# Patient Record
Sex: Female | Born: 2000 | Race: White | Hispanic: No | Marital: Single | Smoking: Never smoker
Health system: Southern US, Community
[De-identification: ages and names within clinical notes are randomized; demographics above are authoritative.]

## PROBLEM LIST (undated history)

## (undated) ENCOUNTER — Inpatient Hospital Stay (HOSPITAL_COMMUNITY): Payer: Self-pay

## (undated) DIAGNOSIS — F419 Anxiety disorder, unspecified: Secondary | ICD-10-CM

## (undated) DIAGNOSIS — F32A Depression, unspecified: Secondary | ICD-10-CM

## (undated) DIAGNOSIS — F321 Major depressive disorder, single episode, moderate: Secondary | ICD-10-CM

## (undated) HISTORY — DX: Major depressive disorder, single episode, moderate: F32.1

## (undated) HISTORY — DX: Anxiety disorder, unspecified: F41.9

## (undated) HISTORY — PX: NO PAST SURGERIES: SHX2092

## (undated) HISTORY — DX: Depression, unspecified: F32.A

---
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
## 2021-02-02 ENCOUNTER — Emergency Department
Admission: EM | Admit: 2021-02-02 | Discharge: 2021-02-02 | Disposition: A | Discharge status: Home / Self Care | Payer: Medicaid Other | Attending: Emergency Medicine | Admitting: Emergency Medicine

## 2021-02-02 ENCOUNTER — Other Ambulatory Visit: Payer: Self-pay

## 2021-02-02 DIAGNOSIS — Y9289 Other specified places as the place of occurrence of the external cause: Secondary | ICD-10-CM | POA: Insufficient documentation

## 2021-02-02 DIAGNOSIS — S71151A Open bite, right thigh, initial encounter: Secondary | ICD-10-CM | POA: Insufficient documentation

## 2021-02-02 DIAGNOSIS — Y9301 Activity, walking, marching and hiking: Secondary | ICD-10-CM | POA: Diagnosis not present

## 2021-02-02 DIAGNOSIS — Z23 Encounter for immunization: Secondary | ICD-10-CM | POA: Insufficient documentation

## 2021-02-02 DIAGNOSIS — Z7722 Contact with and (suspected) exposure to environmental tobacco smoke (acute) (chronic): Secondary | ICD-10-CM | POA: Diagnosis not present

## 2021-02-02 DIAGNOSIS — W540XXA Bitten by dog, initial encounter: Secondary | ICD-10-CM | POA: Insufficient documentation

## 2021-02-02 MED ORDER — TETANUS-DIPHTH-ACELL PERTUSSIS 5-2.5-18.5 LF-MCG/0.5 IM SUSY
0.5000 mL | PREFILLED_SYRINGE | Freq: Once | INTRAMUSCULAR | Status: AC
  Administered 2021-02-02: 0.5 mL via INTRAMUSCULAR
  Filled 2021-02-02: qty 0.5

## 2021-02-02 MED ORDER — AMOXICILLIN-POT CLAVULANATE 875-125 MG PO TABS: 1.0000 | ORAL_TABLET | Freq: Two times a day (BID) | ORAL | 0 refills | Status: AC

## 2021-02-02 NOTE — Discharge Instructions (Addendum)
Take Augmentin twice daily for the next 10 days. Please report incident to [facility] police to make sure animal is available to be quarantined for signs and symptoms of rabies. If animal was not available for quarantine, please return to the emergency department for rabies vaccine series.

## 2021-02-02 NOTE — ED Triage Notes (Signed)
Pt was bitten by neighbors dog to posterior right upper leg, small puncture wounds noted. Pt was not up to date on vaccinations but animal is quarantining the dog.

## 2021-02-02 NOTE — ED Provider Notes (Signed)
[facility]  ____________________________________________  Time seen: Approximately 9:40 PM  I have reviewed the triage vital signs and the nursing notes.   HISTORY  Chief Complaint Animal Bite   Historian Patient     HPI Julie Rios is a 20 y.o. female presents to the emergency department with superficial puncture wounds along the lateral aspect of the right upper thigh.  Patient was taking a walk outside when her neighbors dog bit her.  Dog is available to be quarantined.  Patient is unsure of rabies vaccine status for dog.  Patient's last tetanus shot was 7 years ago.  No similar injuries in the past.   No past medical history on file.   Immunizations up to date:  Yes.     No past medical history on file.  There are no problems to display for this patient.   No past surgical history on file.  Prior to Admission medications   Medication Sig Start Date End Date Taking? Authorizing Provider  amoxicillin-clavulanate (AUGMENTIN) 875-125 MG tablet Take 1 tablet by mouth 2 (two) times daily for 10 days. 02/02/21 02/12/21 Yes Pia Mau M, PA-C  ondansetron (ZOFRAN) 4 MG tablet Take 1-2 tablets (4-8 mg total) by mouth every 6 (six) hours. 08/21/19   Eustace Moore, MD  traMADol-acetaminophen (ULTRACET) 37.5-325 MG tablet Take 1-2 tablets by mouth every 6 (six) hours as needed (headache). 08/21/19   Eustace Moore, MD    Allergies Patient has no known allergies.  Family History  Problem Relation Age of Onset   Diabetes Mother    Cirrhosis Mother     Social History Social History   Tobacco Use   Smoking status: Passive Smoke Exposure - Never Smoker   Smokeless tobacco: Never  Substance Use Topics   Alcohol use: No   Drug use: No     Review of Systems  Constitutional: No fever/chills Eyes:  No discharge ENT: No upper respiratory complaints. Respiratory: no cough. No SOB/ use of accessory muscles to breath Gastrointestinal:   No  nausea, no vomiting.  No diarrhea.  No constipation. Musculoskeletal: Negative for musculoskeletal pain. Skin: Patient has dog bite wound.     ____________________________________________   PHYSICAL EXAM:  VITAL SIGNS: ED Triage Vitals  Enc Vitals Group     BP 02/02/21 1923 124/85     Pulse Rate 02/02/21 1923 94     Resp 02/02/21 1923 20     Temp 02/02/21 1923 98.3 F (36.8 C)     Temp Source 02/02/21 1923 Oral     SpO2 02/02/21 1923 100 %     Weight 02/02/21 1924 180 lb (81.6 kg)     Height 02/02/21 1924 5\' 5"  (1.651 m)     Head Circumference --      Peak Flow --      Pain Score 02/02/21 1924 4     Pain Loc --      Pain Edu? --      Excl. in GC? --      Constitutional: Alert and oriented. Well appearing and in no acute distress. Eyes: Conjunctivae are normal. PERRL. EOMI. Head: Atraumatic. ENT:      Nose: No congestion/rhinnorhea.      Mouth/Throat: Mucous membranes are moist.  Neck: No stridor.  No cervical spine tenderness to palpation. Cardiovascular: Normal rate, regular rhythm. Normal S1 and S2.  Good peripheral circulation. Respiratory: Normal respiratory effort without tachypnea or retractions. Lungs CTAB. Good air entry to the bases with no decreased or  absent breath sounds Gastrointestinal: Bowel sounds x 4 quadrants. Soft and nontender to palpation. No guarding or rigidity. No distention. Musculoskeletal: Full range of motion to all extremities. No obvious deformities noted Neurologic:  Normal for age. No gross focal neurologic deficits are appreciated.  Skin: Patient has superficial puncture wounds along the lateral aspect of the right upper leg. Psychiatric: Mood and affect are normal for age. Speech and behavior are normal.   ____________________________________________   LABS (all labs ordered are listed, but only abnormal results are displayed)  Labs Reviewed - No data to  display ____________________________________________  EKG   ____________________________________________  RADIOLOGY   No results found.  ____________________________________________    PROCEDURES  Procedure(s) performed:     Procedures     Medications  Tdap (BOOSTRIX) injection 0.5 mL (has no administration in time range)     ____________________________________________   INITIAL IMPRESSION / ASSESSMENT AND PLAN / ED COURSE  Pertinent labs & imaging results that were available during my care of the patient were reviewed by me and considered in my medical decision making (see chart for details).      Assessment and plan Dog bite 20 year old female presents to the emergency department after she was bitten by her neighbors dog and sustained superficial puncture wounds along the lateral aspect of the right upper thigh.  Patient's wounds were irrigated in the emergency department and patient was started on Augmentin.  Patient declined rabies vaccine series in the emergency department stating animal could be detained by animal control.  Patient's tetanus status was updated in emergency department.  I emphasized the importance of compliance to Augmentin as there is a high risk of infection without antibiotics following a dog bite wound.  Patient voiced understanding and has easy access to the emergency department should her symptoms change or worsen.     ____________________________________________  FINAL CLINICAL IMPRESSION(S) / ED DIAGNOSES  Final diagnoses:  Dog bite, initial encounter      NEW MEDICATIONS STARTED DURING THIS VISIT:  ED Discharge Orders          Ordered    amoxicillin-clavulanate (AUGMENTIN) 875-125 MG tablet  2 times daily        02/02/21 2127                This chart was dictated using voice recognition software/Dragon. Despite best efforts to proofread, errors can occur which can change the meaning. Any change was purely  unintentional.     Gasper Lloyd 02/02/21 2145    Phineas Semen, MD 02/02/21 [phone]

## 2021-02-05 ENCOUNTER — Encounter: Payer: Self-pay | Admitting: Emergency Medicine

## 2021-02-05 ENCOUNTER — Emergency Department: Payer: Medicaid Other

## 2021-02-05 ENCOUNTER — Other Ambulatory Visit: Payer: Self-pay

## 2021-02-05 ENCOUNTER — Emergency Department
Admission: EM | Admit: 2021-02-05 | Discharge: 2021-02-05 | Disposition: A | Discharge status: Home / Self Care | Payer: Medicaid Other | Attending: Emergency Medicine | Admitting: Emergency Medicine

## 2021-02-05 DIAGNOSIS — Z7722 Contact with and (suspected) exposure to environmental tobacco smoke (acute) (chronic): Secondary | ICD-10-CM | POA: Insufficient documentation

## 2021-02-05 DIAGNOSIS — Z203 Contact with and (suspected) exposure to rabies: Secondary | ICD-10-CM | POA: Insufficient documentation

## 2021-02-05 DIAGNOSIS — Z23 Encounter for immunization: Secondary | ICD-10-CM | POA: Diagnosis not present

## 2021-02-05 DIAGNOSIS — M79604 Pain in right leg: Secondary | ICD-10-CM | POA: Diagnosis not present

## 2021-02-05 DIAGNOSIS — Z2914 Encounter for prophylactic rabies immune globin: Secondary | ICD-10-CM | POA: Diagnosis present

## 2021-02-05 DIAGNOSIS — W540XXD Bitten by dog, subsequent encounter: Secondary | ICD-10-CM

## 2021-02-05 DIAGNOSIS — W57XXXD Bitten or stung by nonvenomous insect and other nonvenomous arthropods, subsequent encounter: Secondary | ICD-10-CM | POA: Insufficient documentation

## 2021-02-05 IMAGING — CR DG FEMUR 2+V*R*
4 series · 4 of 4 positions shown · non-contrast
Comparison: None.

CLINICAL DATA: Dog bite, concern for foreign body

EXAM:
RIGHT FEMUR 2 VIEWS

[femur ap (1 of 2)]
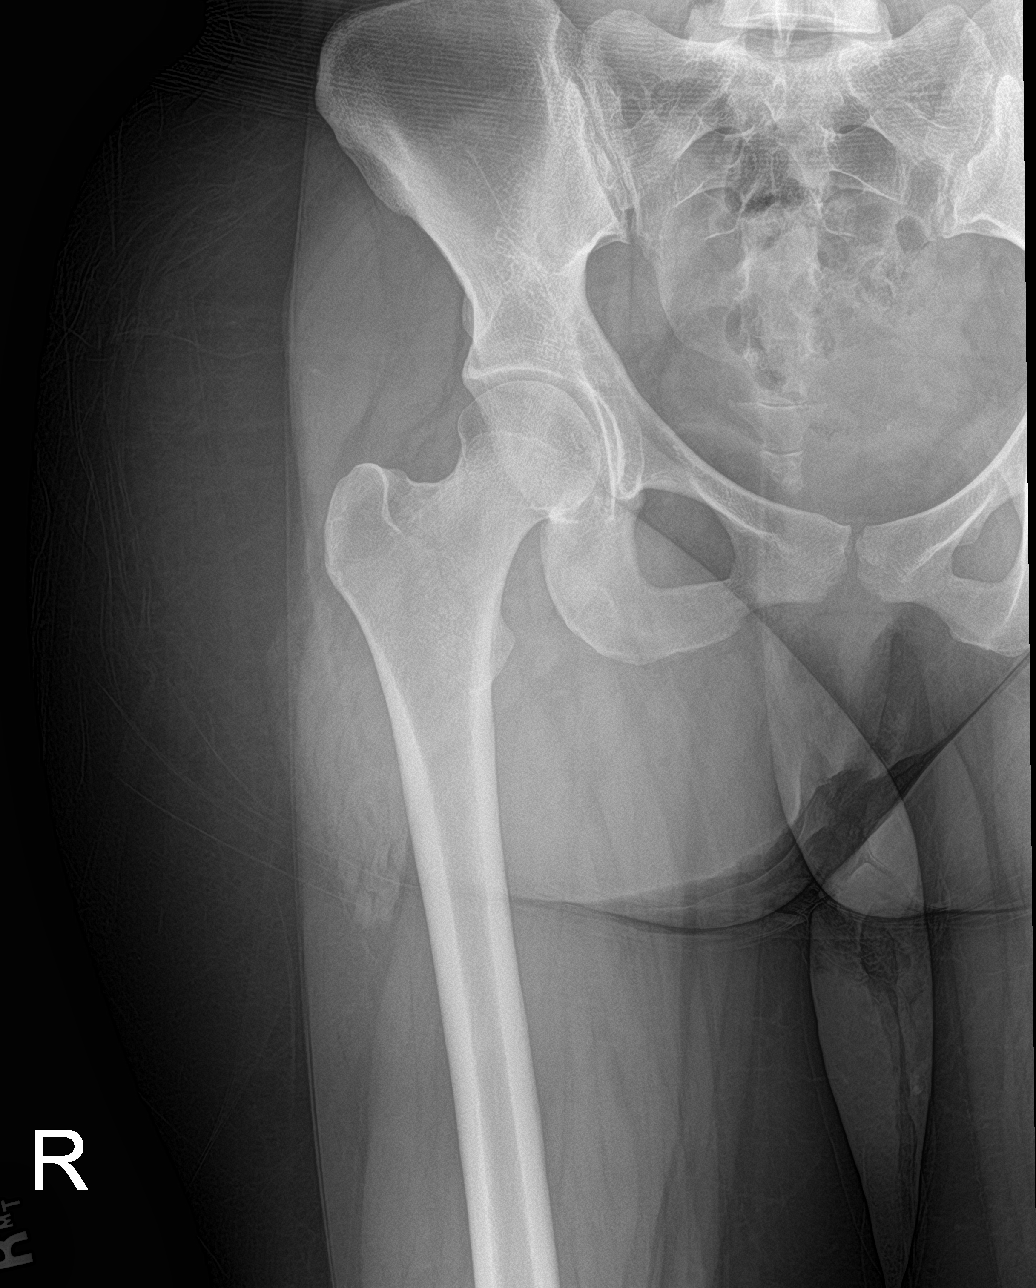

[femur ap (2 of 2)]
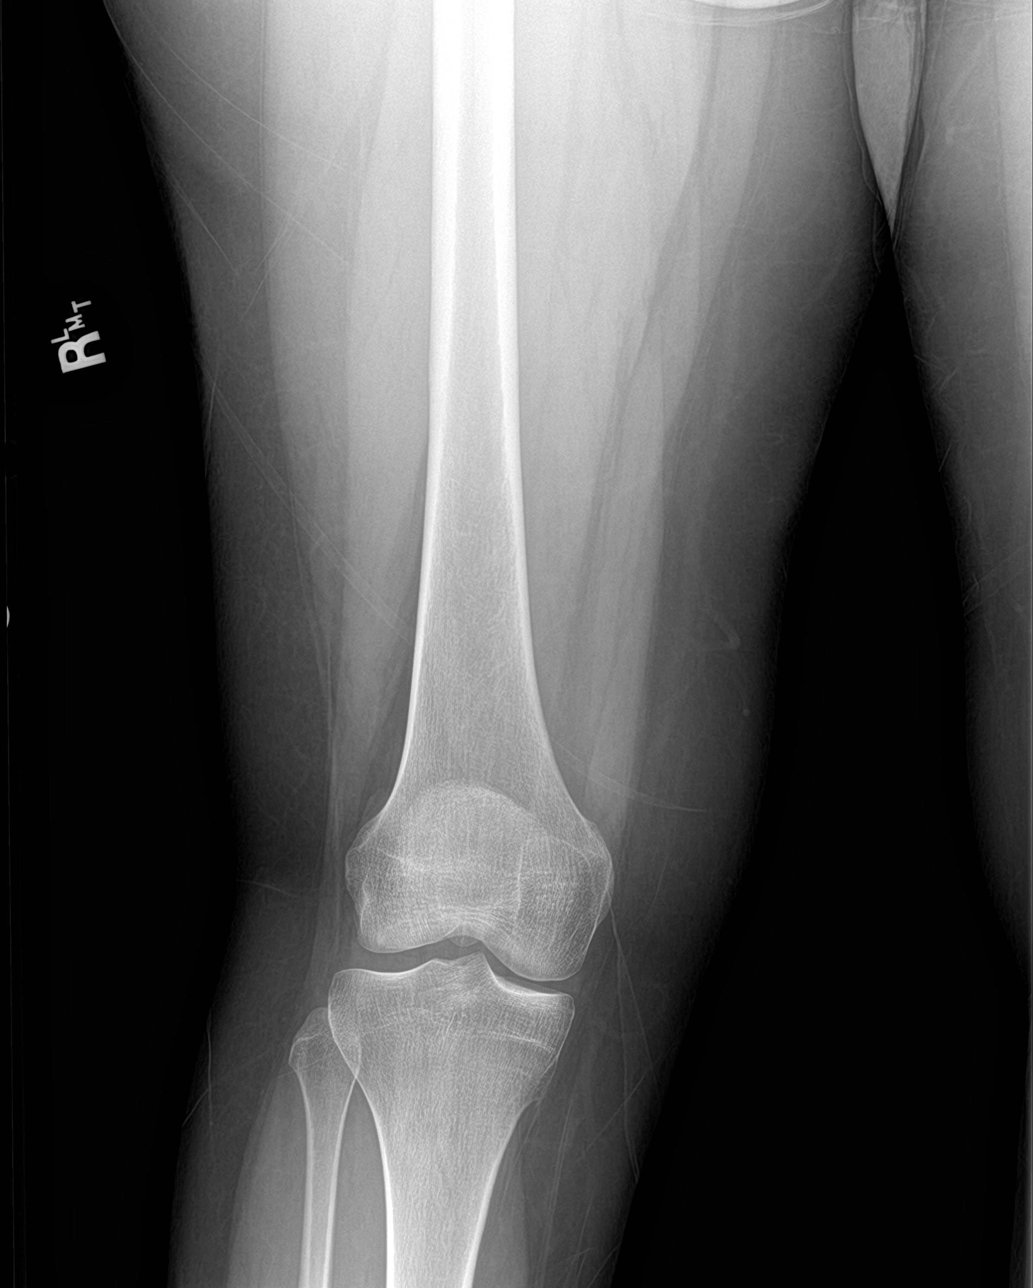

[femur lat (1 of 2)]
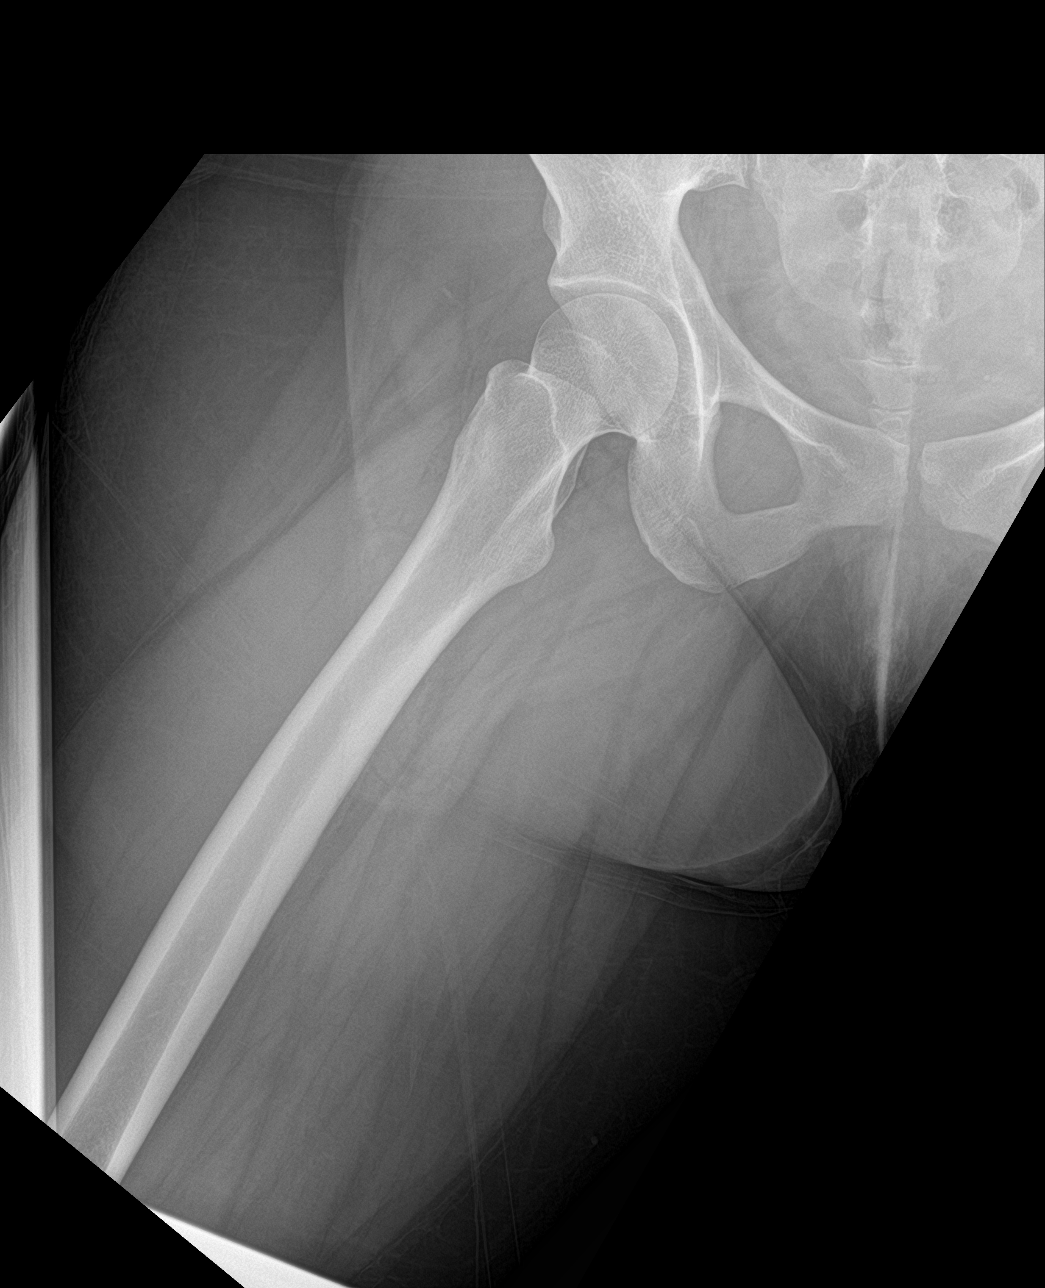

[femur lat (2 of 2)]
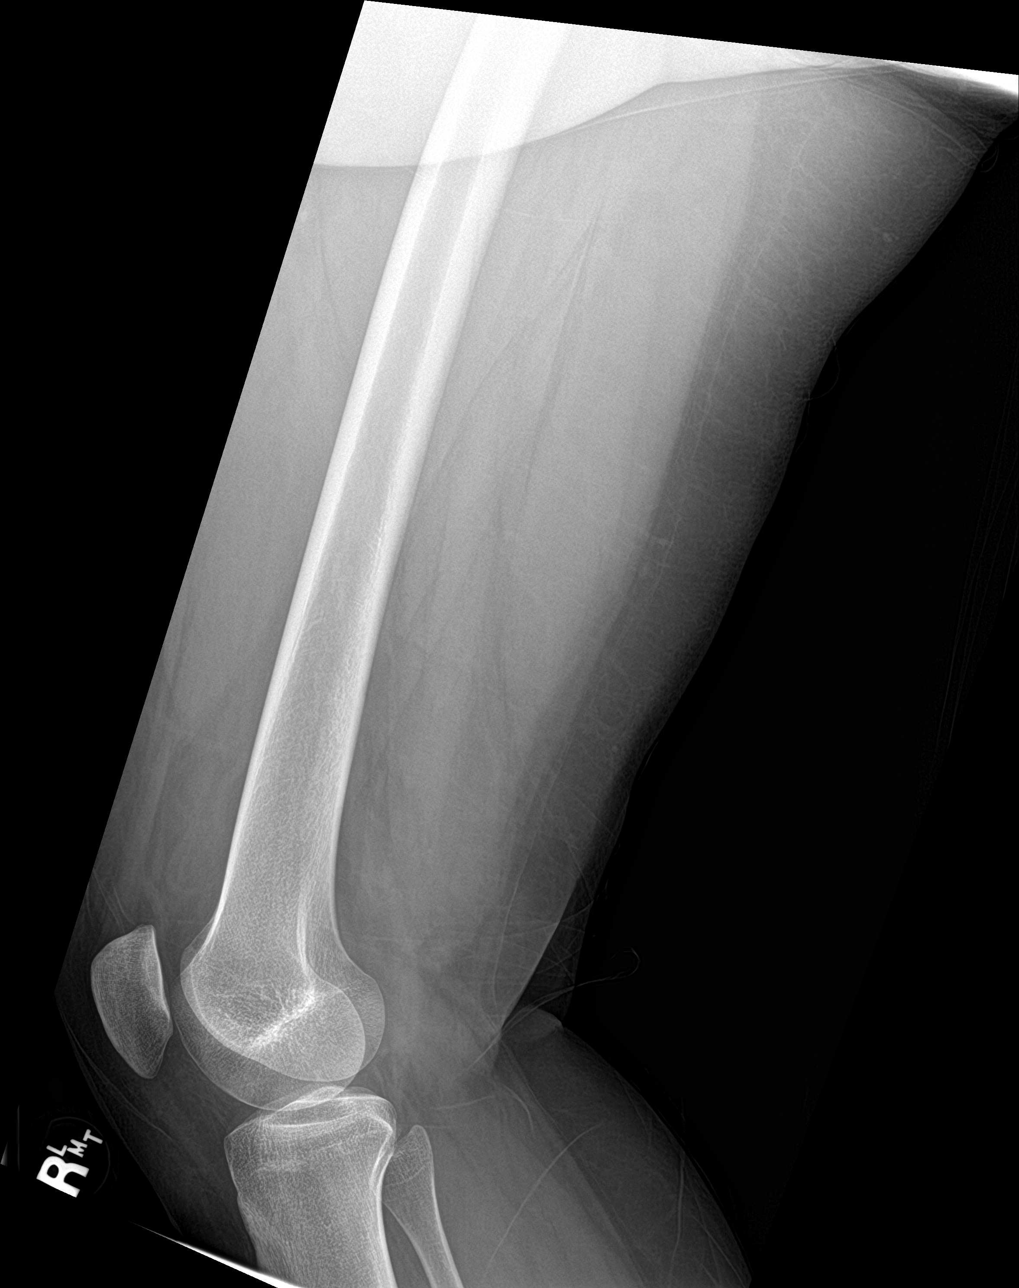

[4 of 4 positions shown; findings below may reference images not displayed]

FINDINGS: No radiopaque foreign body is identified. The bones are normal in
appearance. Knee and hip alignment is normal. There is no soft
tissue gas.
IMPRESSION: No radiopaque foreign body identified.

## 2021-02-05 MED ORDER — RABIES VACCINE, PCEC IM SUSR
1.0000 mL | Freq: Once | INTRAMUSCULAR | Status: AC
  Administered 2021-02-05: 1 mL via INTRAMUSCULAR
  Filled 2021-02-05: qty 1

## 2021-02-05 MED ORDER — RABIES IMMUNE GLOBULIN 150 UNIT/ML IM INJ
20.0000 [IU]/kg | INJECTION | Freq: Once | INTRAMUSCULAR | Status: AC
  Administered 2021-02-05: 1650 [IU] via INTRAMUSCULAR
  Filled 2021-02-05: qty 11

## 2021-02-05 NOTE — ED Provider Notes (Signed)
[facility] Emergency Department Provider Note  ____________________________________________   Event Date/Time   First MD Initiated Contact with Patient 02/05/21 [phone]     (approximate)  I have reviewed the triage vital signs and the nursing notes.   HISTORY  Chief Complaint Rabies Injection    HPI Julie Rios is a 20 y.o. female presents emergency department for a rabies injection.  Patient was bitten by a dog and seen here in the emergency department.  Animal control told her that the dog is unvaccinated and that she should come here for vaccines.  Patient did get her Tdap updated plan here in the ED.  Complains of continued pain and soreness on the right leg.  States it feels worse than it did the other day  History reviewed. No pertinent past medical history.  There are no problems to display for this patient.   History reviewed. No pertinent surgical history.  Prior to Admission medications   Medication Sig Start Date End Date Taking? Authorizing Provider  amoxicillin-clavulanate (AUGMENTIN) 875-125 MG tablet Take 1 tablet by mouth 2 (two) times daily for 10 days. 02/02/21 02/12/21  Orvil Feil, PA-C  ondansetron (ZOFRAN) 4 MG tablet Take 1-2 tablets (4-8 mg total) by mouth every 6 (six) hours. 08/21/19   Eustace Moore, MD  traMADol-acetaminophen (ULTRACET) 37.5-325 MG tablet Take 1-2 tablets by mouth every 6 (six) hours as needed (headache). 08/21/19   Eustace Moore, MD    Allergies Patient has no known allergies.  Family History  Problem Relation Age of Onset   Diabetes Mother    Cirrhosis Mother     Social History Social History   Tobacco Use   Smoking status: Passive Smoke Exposure - Never Smoker   Smokeless tobacco: Never  Substance Use Topics   Alcohol use: No   Drug use: No    Review of Systems  Constitutional: No fever/chills Eyes: No visual changes. ENT: No sore throat. Respiratory: Denies  cough Cardiovascular: Denies chest pain Gastrointestinal: Denies abdominal pain Genitourinary: Negative for dysuria. Musculoskeletal: Negative for back pain.  Positive for right leg pain from dog bite Skin: Negative for rash. Psychiatric: no mood changes,     ____________________________________________   PHYSICAL EXAM:  VITAL SIGNS: ED Triage Vitals  Enc Vitals Group     BP 02/05/21 0953 122/74     Pulse Rate 02/05/21 0953 90     Resp 02/05/21 0953 17     Temp 02/05/21 0953 97.9 F (36.6 C)     Temp Source 02/05/21 0953 Oral     SpO2 02/05/21 0953 99 %     Weight 02/05/21 0937 179 lb 14.3 oz (81.6 kg)     Height 02/05/21 0937 5\' 5"  (1.651 m)     Head Circumference --      Peak Flow --      Pain Score 02/05/21 0937 0     Pain Loc --      Pain Edu? --      Excl. in GC? --     Constitutional: Alert and oriented. Well appearing and in no acute distress. Eyes: Conjunctivae are normal.  Head: Atraumatic. Nose: No congestion/rhinnorhea. Mouth/Throat: Mucous membranes are moist.   Neck:  supple no lymphadenopathy noted Cardiovascular: Normal rate, regular rhythm.  Respiratory: Normal respiratory effort.  No retractions,  GU: deferred Musculoskeletal: FROM all extremities, warm and well perfused, right leg is tender to palpation along the site of the dog bite, large amount of bruising,  puncture wounds noted, no redness Neurologic:  Normal speech and language.  Skin:  Skin is warm, dry and intact. No rash noted. Psychiatric: Mood and affect are normal. Speech and behavior are normal.  ____________________________________________   LABS (all labs ordered are listed, but only abnormal results are displayed)  Labs Reviewed - No data to display ____________________________________________   ____________________________________________  RADIOLOGY  Right femur  ____________________________________________   PROCEDURES  Procedure(s) performed:  No  Procedures    ____________________________________________   INITIAL IMPRESSION / ASSESSMENT AND PLAN / ED COURSE  Pertinent labs & imaging results that were available during my care of the patient were reviewed by me and considered in my medical decision making (see chart for details).   The patient is 20 year old female presents with dog bite.  Already evaluated in the ED previously.  Was told by animal control to come get rabies vaccinations as the dog was not acting correctly and is not vaccinated.  X-ray of the right femur to rule out foreign body or gas pattern Rabies vaccines ordered  X-ray of the right femur reviewed by me confirmed by radiology to be negative for foreign body or gas pattern  Rabies vaccine and schedule given to the patient.  She is to follow-up at the urgent care for repeat RabAvert's.  Return emergency department if worsening.  Discharged in stable condition  Julie Rios was evaluated in Emergency Department on 02/05/2021 for the symptoms described in the history of present illness. She was evaluated in the context of the global COVID-19 pandemic, which necessitated consideration that the patient might be at risk for infection with the SARS-CoV-2 virus that causes COVID-19. Institutional protocols and algorithms that pertain to the evaluation of patients at risk for COVID-19 are in a state of rapid change based on information released by regulatory bodies including the CDC and federal and state organizations. These policies and algorithms were followed during the patient's care in the ED.    As part of my medical decision making, I reviewed the following data within the electronic MEDICAL RECORD NUMBER Nursing notes reviewed and incorporated, Old chart reviewed, Radiograph reviewed , Notes from prior ED visits, and [place] Controlled Substance Database  ____________________________________________   FINAL CLINICAL IMPRESSION(S) / ED DIAGNOSES  Final diagnoses:   Dog bite, subsequent encounter  Encounter for prophylactic rabies immune globin      NEW MEDICATIONS STARTED DURING THIS VISIT:  New Prescriptions   No medications on file     Note:  This document was prepared using Dragon voice recognition software and may include unintentional dictation errors.    Faythe Ghee, PA-C 02/05/21 1149    Sharman Cheek, MD 02/05/21 1452

## 2021-02-05 NOTE — Discharge Instructions (Addendum)
Return in 3 days, 7 days and 14 days for repeat administraion of rabavert vaccine, you may go to urgent care to get this vaccine.

## 2021-02-05 NOTE — ED Triage Notes (Signed)
Arrives for initial rabies injection.  Bitten by dog and seen through ED for bite.  Heard from [employer] that dog was not up to date with vaccines.  AAOx3.  Skin warm and dry. NAD

## 2021-02-08 ENCOUNTER — Ambulatory Visit (HOSPITAL_COMMUNITY)
Admission: EM | Admit: 2021-02-08 | Discharge: 2021-02-08 | Disposition: A | Discharge status: Home / Self Care | Payer: Medicaid Other | Attending: Internal Medicine | Admitting: Internal Medicine

## 2021-02-08 ENCOUNTER — Other Ambulatory Visit: Payer: Self-pay

## 2021-02-08 DIAGNOSIS — Z203 Contact with and (suspected) exposure to rabies: Secondary | ICD-10-CM

## 2021-02-08 MED ORDER — RABIES VACCINE, PCEC IM SUSR
1.0000 mL | Freq: Once | INTRAMUSCULAR | Status: AC
  Administered 2021-02-08: 1 mL via INTRAMUSCULAR

## 2021-02-08 MED ORDER — RABIES VACCINE, PCEC IM SUSR
INTRAMUSCULAR | Status: AC
  Filled 2021-02-08: qty 1

## 2021-02-08 NOTE — ED Triage Notes (Signed)
Pt presents for rabies vaccine. Denies any concerns at this time.  °

## 2021-02-12 ENCOUNTER — Inpatient Hospital Stay
Admission: RE | Admit: 2021-02-12 | Discharge: 2021-02-12 | Disposition: A | Discharge status: Home / Self Care | Payer: Self-pay | Source: Ambulatory Visit

## 2021-02-12 ENCOUNTER — Other Ambulatory Visit: Payer: Self-pay

## 2021-02-12 ENCOUNTER — Ambulatory Visit
Admission: EM | Admit: 2021-02-12 | Discharge: 2021-02-12 | Disposition: A | Discharge status: Home / Self Care | Payer: Medicaid Other | Attending: Internal Medicine | Admitting: Internal Medicine

## 2021-02-12 DIAGNOSIS — Z23 Encounter for immunization: Secondary | ICD-10-CM

## 2021-02-12 DIAGNOSIS — Z203 Contact with and (suspected) exposure to rabies: Secondary | ICD-10-CM | POA: Diagnosis not present

## 2021-02-12 MED ORDER — RABIES VACCINE, PCEC IM SUSR
1.0000 mL | Freq: Once | INTRAMUSCULAR | Status: AC
  Administered 2021-02-12: 1 mL via INTRAMUSCULAR

## 2021-02-12 NOTE — Discharge Instructions (Signed)
Return 02/19/2021

## 2021-02-12 NOTE — ED Triage Notes (Signed)
Patient is here for rabies injection

## 2021-02-12 NOTE — Discharge Instructions (Signed)
Return October 10 for final rabies injection

## 2021-02-12 NOTE — ED Notes (Signed)
Family member dropped patient off at [place] location.  Patient was not certain about details of ED visit on 9/26.  Patient had not received any rabies vaccines on that visit.  Patient started rabies series on 9/29.  Family member had details of visits to ED and ucc that clarified onset and sequence of vaccines.

## 2021-02-19 ENCOUNTER — Ambulatory Visit
Admission: RE | Admit: 2021-02-19 | Discharge: 2021-02-19 | Disposition: A | Discharge status: Home / Self Care | Payer: Medicaid Other | Source: Ambulatory Visit | Attending: Emergency Medicine | Admitting: Emergency Medicine

## 2021-02-19 ENCOUNTER — Other Ambulatory Visit: Payer: Self-pay

## 2021-02-19 DIAGNOSIS — Z203 Contact with and (suspected) exposure to rabies: Secondary | ICD-10-CM

## 2021-02-19 MED ORDER — RABIES VACCINE, PCEC IM SUSR
1.0000 mL | Freq: Once | INTRAMUSCULAR | Status: AC
  Administered 2021-02-19: 1 mL via INTRAMUSCULAR

## 2021-02-19 NOTE — ED Triage Notes (Signed)
Patient presents to urgent care for Rabies vaccine. Initial dose given 09/29. Today marks day 14 for last dose. Pt tolerated previous doses well.

## 2021-02-19 NOTE — ED Notes (Signed)
Pt received last dose of rabies vaccine. Tolerated well. Edu provided. Voiced understanding.

## 2021-04-16 ENCOUNTER — Other Ambulatory Visit: Payer: Self-pay

## 2021-04-16 ENCOUNTER — Other Ambulatory Visit (HOSPITAL_COMMUNITY)
Admission: EM | Admit: 2021-04-16 | Discharge: 2021-04-20 | Disposition: A | Discharge status: Home / Self Care | Payer: Medicaid Other | Attending: Psychiatry | Admitting: Psychiatry

## 2021-04-16 DIAGNOSIS — Z634 Disappearance and death of family member: Secondary | ICD-10-CM | POA: Insufficient documentation

## 2021-04-16 DIAGNOSIS — F419 Anxiety disorder, unspecified: Secondary | ICD-10-CM | POA: Diagnosis not present

## 2021-04-16 DIAGNOSIS — R519 Headache, unspecified: Secondary | ICD-10-CM | POA: Insufficient documentation

## 2021-04-16 DIAGNOSIS — F321 Major depressive disorder, single episode, moderate: Secondary | ICD-10-CM | POA: Insufficient documentation

## 2021-04-16 DIAGNOSIS — Z20822 Contact with and (suspected) exposure to covid-19: Secondary | ICD-10-CM | POA: Diagnosis not present

## 2021-04-16 DIAGNOSIS — F322 Major depressive disorder, single episode, severe without psychotic features: Secondary | ICD-10-CM | POA: Insufficient documentation

## 2021-04-16 DIAGNOSIS — R480 Dyslexia and alexia: Secondary | ICD-10-CM | POA: Insufficient documentation

## 2021-04-16 DIAGNOSIS — F332 Major depressive disorder, recurrent severe without psychotic features: Secondary | ICD-10-CM | POA: Diagnosis present

## 2021-04-16 DIAGNOSIS — F339 Major depressive disorder, recurrent, unspecified: Secondary | ICD-10-CM | POA: Diagnosis present

## 2021-04-16 DIAGNOSIS — R45851 Suicidal ideations: Secondary | ICD-10-CM | POA: Insufficient documentation

## 2021-04-16 DIAGNOSIS — Z566 Other physical and mental strain related to work: Secondary | ICD-10-CM | POA: Insufficient documentation

## 2021-04-16 DIAGNOSIS — Z79899 Other long term (current) drug therapy: Secondary | ICD-10-CM | POA: Insufficient documentation

## 2021-04-16 LAB — POCT URINE DRUG SCREEN - MANUAL ENTRY (I-SCREEN)
POC Amphetamine UR: NOT DETECTED
POC Buprenorphine (BUP): NOT DETECTED
POC Cocaine UR: NOT DETECTED
POC Marijuana UR: NOT DETECTED
POC Methadone UR: NOT DETECTED
POC Methamphetamine UR: NOT DETECTED
POC Morphine: NOT DETECTED
POC Oxazepam (BZO): NOT DETECTED
POC Oxycodone UR: NOT DETECTED
POC Secobarbital (BAR): NOT DETECTED

## 2021-04-16 LAB — POC SARS CORONAVIRUS 2 AG -  ED: SARS Coronavirus 2 Ag: NEGATIVE

## 2021-04-16 LAB — POC SARS CORONAVIRUS 2 AG: SARSCOV2ONAVIRUS 2 AG: NEGATIVE

## 2021-04-16 LAB — POCT PREGNANCY, URINE: Preg Test, Ur: NEGATIVE

## 2021-04-16 MED ORDER — ACETAMINOPHEN 325 MG PO TABS
650.0000 mg | ORAL_TABLET | Freq: Four times a day (QID) | ORAL | Status: DC | PRN
  Administered 2021-04-18 – 2021-04-19 (×2): 650 mg via ORAL
  Filled 2021-04-16 (×2): qty 2

## 2021-04-16 MED ORDER — TRAZODONE HCL 50 MG PO TABS: 50.0000 mg | ORAL_TABLET | Freq: Every evening | ORAL | Status: DC | PRN

## 2021-04-16 MED ORDER — HYDROXYZINE HCL 25 MG PO TABS: 25.0000 mg | ORAL_TABLET | Freq: Three times a day (TID) | ORAL | Status: DC | PRN

## 2021-04-16 MED ORDER — ALUM & MAG HYDROXIDE-SIMETH 200-200-20 MG/5ML PO SUSP: 30.0000 mL | ORAL | Status: DC | PRN

## 2021-04-16 MED ORDER — MAGNESIUM HYDROXIDE 400 MG/5ML PO SUSP: 30.0000 mL | Freq: Every day | ORAL | Status: DC | PRN

## 2021-04-16 NOTE — ED Notes (Signed)
Pt presents with suicidal thoughts, plan to cut self with knife.  Denies HI or AVH. Major stressors are both pt's parents have passed and the holiday season is a big trigger for her and verbalizes a lot of job related stress.  Pt calm & cooperative, appreciative of care.  Skin search completed.  Monitoring for safety, no distress noted.

## 2021-04-16 NOTE — ED Provider Notes (Signed)
Behavioral Health Admission H&P [facility] & OBS)  Date: 04/17/21 Patient Name: Julie Rios MRN: [id number] Chief Complaint:  Chief Complaint  Patient presents with   Suicidal   bereavement      Diagnoses:  Final diagnoses:  MDD (major depressive disorder), single episode, severe , no psychosis ([place])  Anxiety  Bereavement    HPI: Julie Rios is a 20 year old female with no significant past psychiatric history and past medical history significant for dyslexia and dog bite of her right lower extremity on 02/02/2021 (Per patient and chart review, patient is up-to-date on all of her rabies vaccinations and obtain final rabies vaccination 02/19/2021, patient reports that this dog bite has healed appropriately), who presents to the [facility] behavioral health urgent care [facility]) as a voluntary walk-in for worsening depression, anxiety, and SI.  Per patient, patient was dropped off at the [facility] by her boyfriend.  Patient reports that boyfriend's vehicle was followed to the [facility] by mobile crisis.  Patient's boyfriend not present during the evaluation.  Patient states that earlier today on 04/16/2021, she developed suicidal ideation around 8/9:00 AM with thoughts of cutting herself with a knife.  Patient states that she did not attempt to cut herself or harm herself at that time or ever in the past.  Patient states that she told her uncle earlier today about the suicidal thoughts she had earlier this morning, who then told patient's grandmother about the suicidal thoughts, which led to patient's grandmother calling mobile crisis.  Patient states that she then spoke to a mobile crisis counselor on the phone, which led to the mobile crisis staff coming into the patient's home and recommended for the patient to go to the [facility] for further psychiatric evaluation.  Per TTS counselor, it was documented in mobile crisis report that it was recommended for patient to be observed overnight at the [facility] for safety  purposes.  Patient denies SI currently on exam.  She reports that she last experienced SI earlier this morning around 8/9:00 a.m. with thoughts of cutting herself with a knife.  She denies having any suicidal intent at the time of this SI that she had earlier this morning noted above.  Prior to today, patient states that she last experienced SI about 8 months ago when her father passed away and she states that this SI was passive and consisted of thoughts of not wanting to be alive".  She denies history of any additional past active SI or suicidal intent.  Patient denies history of any past suicide attempts.  She denies history of any past self-injurious behavior via intentionally cutting or burning herself.  She denies homicidal ideations.  She denies AVH or paranoia.  She reports that her father passed away 8 months ago and that her mother passed away 4 years ago.  Patient states that the loss of both of her parents has significantly contributed to her feelings of depression and anxiety.  Patient states that her depression and anxiety regarding the loss of her parents has significantly increased recently over the past few weeks due to the holiday season approaching, which reminds her of spending time with her parents.  Patient states that she began experiencing very bad anxiety when her father passed away 8 months ago.  Patient denies any recent panic attacks but she states that she did experience one panic attack 2 months ago while she was driving in which she began crying and had to pull her car over and have someone come pick her up  and take her home at that time.  Patient reports additional stressors include her recently starting new job and her having increased stress/anxiety when driving.  Patient states that she recently started driving within the past 8 months and she states that she tries to avoid driving in areas where there is increased traffic due to her anxiety with driving.  Patient describes her  sleep as fair.  She states that her sleep is poor on occasion as well.  Patient states that on nights that she has to work the next day, she sleeps about 6 to 8 hours per night and she states that on nights that she does not have to work the next day, she sleeps about 5 hours per night.  Patient endorses anhedonia as well as feelings of guilt, hopelessness, and worthlessness over the past few months.  Patient endorses fluctuating energy changes over the past few months.  She denies concentration changes over the past few months.  She endorses decreased appetite over the past few months, but states that she is unsure if she has had any weight changes over the past few months.  Patient denies taking any psychotropic medications at this time.  She denies taking any psychotropic medications in the past.  She denies having a psychiatrist or therapist at this time.  She denies seeing a psychiatrist or therapist in the past.  She denies history of any past inpatient psychiatric hospitalizations.  Patient denies taking any home medications at this time.  Patient lives in [place] with her boyfriend on her grandmother's property, in which she states her grandmother, 68 year old sister, and 87 year old brother living on this property as well.  Patient denies access to firearms.  She denies alcohol, tobacco/nicotine, or illicit substance use.  Patient is currently employed at [employer] center in [place].  Her highest level of education is high school graduation.  Patient is not in school at this time.  On exam, patient is sitting upright in no acute distress.  Her mood is depressed and anxious with congruent, tearful affect.  Eye contact is good.  Speech is clear and coherent with normal rate and volume.  Thought process is coherent, goal directed, and linear.  Patient is alert and oriented x4, cooperative, and answers all questions appropriately during the evaluation.  No indication the  patient is responding to internal/external stimuli on exam.  No delusional thought content noted on exam.  PHQ 2-9:   Flowsheet Row ED from 04/16/2021 in [facility] ED from 02/12/2021 in [facility] at [facility]  ED from 02/05/2021 in [facility]  C-SSRS RISK CATEGORY Moderate Risk No Risk No Risk        Total Time spent with patient: 30 minutes  Musculoskeletal  Strength & Muscle Tone: within normal limits Gait & Station: normal Patient leans: N/A  Psychiatric Specialty Exam  Presentation General Appearance: Appropriate for Environment; Fairly Groomed  Eye Contact:Good  Speech:Clear and Coherent; Normal Rate  Speech Volume:Normal  Handedness:No data recorded  Mood and Affect  Mood:Depressed; Anxious  Affect:Congruent; Tearful   Thought Process  Thought Processes:Coherent; Goal Directed; Linear  Descriptions of Associations:Intact  Orientation:Full (Time, Place and Person)  Thought Content:Logical; WDL  Diagnosis of Schizophrenia or Schizoaffective disorder in past: No   Hallucinations:Hallucinations: None  Ideas of Reference:None  Suicidal Thoughts:Suicidal Thoughts: -- ([place] denies SI currently on exam, but does endorse having SI earlier todaya round 8/9 AM with thoughts of wanting to  stab herself with a knife (see HPI for details).)  Homicidal Thoughts:Homicidal Thoughts: No   Sensorium  Memory:Immediate Good; Recent Good; Remote Good  Judgment:Good  Insight:Good   Executive Functions  Concentration:Good  Attention Span:Good  [place] of Knowledge:Good  Language:Good   Psychomotor Activity  Psychomotor Activity:Psychomotor Activity: Normal   Assets  Assets:Communication Skills; Desire for Improvement; Financial Resources/Insurance; Housing; Leisure Time; Physical Health; Resilience; Social Support; Talents/Skills; Transportation;  Vocational/Educational   Sleep  Sleep:Sleep: Fair Number of Hours of Sleep: 5   Nutritional Assessment (For OBS and [facility]BC admissions only) Has the patient had a weight loss or gain of 10 pounds or more in the last 3 months?: -- (Patient is unsure.) Has the patient had a decrease in food intake/or appetite?: Yes Does the patient have dental problems?: No Does the patient have eating habits or behaviors that may be indicators of an eating disorder including binging or inducing vomiting?: No Has the patient recently lost weight without trying?: 2.0 Has the patient been eating poorly because of a decreased appetite?: 1 Malnutrition Screening Tool Score: 3 Nutritional Assessment Referrals: Refer to Primary Care Provider   Physical Exam Vitals reviewed.  Constitutional:      General: She is not in acute distress.    Appearance: Normal appearance. She is not ill-appearing, toxic-appearing or diaphoretic.  HENT:     Head: Normocephalic and atraumatic.     Right Ear: External ear normal.     Left Ear: External ear normal.     Nose: Nose normal.  Eyes:     General:        Right eye: No discharge.        Left eye: No discharge.     Conjunctiva/sclera: Conjunctivae normal.  Cardiovascular:     Rate and Rhythm: Normal rate.  Pulmonary:     Effort: Pulmonary effort is normal. No respiratory distress.  Musculoskeletal:        General: Normal range of motion.     Cervical back: Normal range of motion.  Neurological:     General: No focal deficit present.     Mental Status: She is alert and oriented to person, place, and time.     Comments: No tremor noted.   Psychiatric:        Attention and Perception: Attention and perception normal. She does not perceive auditory or visual hallucinations.        Mood and Affect: Mood is anxious and depressed.        Speech: Speech normal.        Behavior: Behavior normal. Behavior is not agitated, slowed, aggressive, hyperactive or combative.  Behavior is cooperative.        Thought Content: Thought content normal. Thought content is not paranoid or delusional. Thought content does not include homicidal ideation.     Comments: Affect mood congruent and tearful. Patient denies SI currently on exam, but does endorse having SI earlier todaya round 8/9 AM with thoughts of wanting to stab herself with a knife (see HPI for details).    Review of Systems  Constitutional:  Positive for malaise/fatigue. Negative for chills, diaphoresis and fever.       Patient unsure of weight changes.  HENT:  Negative for congestion.   Respiratory:  Negative for cough and shortness of breath.   Cardiovascular:  Negative for chest pain and palpitations.  Gastrointestinal:  Negative for abdominal pain, constipation, diarrhea, nausea and vomiting.  Musculoskeletal:  Negative for joint pain and myalgias.  Neurological:  Negative for dizziness and headaches.  Psychiatric/Behavioral:  Positive for depression and suicidal ideas. Negative for hallucinations, memory loss and substance abuse. The patient is nervous/anxious.   All other systems reviewed and are negative.  Vitals: Blood pressure 133/85, pulse 97, temperature 98.2 F (36.8 C), temperature source Oral, resp. rate 18, SpO2 99 %. There is no height or weight on file to calculate BMI.  Past Psychiatric History: No significant past psychiatric history.  Please see HPI for further details regarding patient's past psychiatric history.  Is the patient at risk to self? Yes  Has the patient been a risk to self in the past 6 months? No .    Has the patient been a risk to self within the distant past? No   Is the patient a risk to others? No   Has the patient been a risk to others in the past 6 months? No   Has the patient been a risk to others within the distant past? No   Past Medical History: No past medical history on file. No past surgical history on file.  Family History:  Family History  Problem  Relation Age of Onset   Diabetes Mother    Cirrhosis Mother     Social History:  Social History   Socioeconomic History   Marital status: Single    Spouse name: Not on file   Number of children: Not on file   Years of education: Not on file   Highest education level: Not on file  Occupational History   Not on file  Tobacco Use   Smoking status: Passive Smoke Exposure - Never Smoker   Smokeless tobacco: Never  Substance and Sexual Activity   Alcohol use: No   Drug use: No   Sexual activity: Not Currently  Other Topics Concern   Not on file  Social History Narrative   Not on file   Social Determinants of Health   Financial Resource Strain: Not on file  Food Insecurity: Not on file  Transportation Needs: Not on file  Physical Activity: Not on file  Stress: Not on file  Social Connections: Not on file  Intimate Partner Violence: Not on file    SDOH:  SDOH Screenings   Alcohol Screen: Not on file  Depression (PHQ2-9): Not on file  Financial Resource Strain: Not on file  Food Insecurity: Not on file  Housing: Not on file  Physical Activity: Not on file  Social Connections: Not on file  Stress: Not on file  Tobacco Use: Medium Risk   Smoking Tobacco Use: Passive Smoke Exposure - Never Smoker   Smokeless Tobacco Use: Never   Passive Exposure: Yes  Transportation Needs: Not on file    Last Labs:  Admission on 04/16/2021  Component Date Value Ref Range Status   SARS Coronavirus 2 Ag 04/16/2021 Negative  Negative Preliminary   POC Amphetamine UR 04/16/2021 None Detected  NONE DETECTED (Cut Off Level 1000 ng/mL) Preliminary   POC Secobarbital (BAR) 04/16/2021 None Detected  NONE DETECTED (Cut Off Level 300 ng/mL) Preliminary   POC Buprenorphine (BUP) 04/16/2021 None Detected  NONE DETECTED (Cut Off Level 10 ng/mL) Preliminary   POC Oxazepam (BZO) 04/16/2021 None Detected  NONE DETECTED (Cut Off Level 300 ng/mL) Preliminary   POC Cocaine UR 04/16/2021 None Detected   NONE DETECTED (Cut Off Level 300 ng/mL) Preliminary   POC Methamphetamine UR 04/16/2021 None Detected  NONE DETECTED (Cut Off Level 1000 ng/mL) Preliminary   POC Morphine 04/16/2021 None  Detected  NONE DETECTED (Cut Off Level 300 ng/mL) Preliminary   POC Oxycodone UR 04/16/2021 None Detected  NONE DETECTED (Cut Off Level 100 ng/mL) Preliminary   POC Methadone UR 04/16/2021 None Detected  NONE DETECTED (Cut Off Level 300 ng/mL) Preliminary   POC Marijuana UR 04/16/2021 None Detected  NONE DETECTED (Cut Off Level 50 ng/mL) Preliminary   SARSCOV2ONAVIRUS 2 AG 04/16/2021 NEGATIVE  NEGATIVE Final   Comment: (NOTE) SARS-CoV-2 antigen NOT DETECTED.   Negative results are presumptive.  Negative results do not preclude SARS-CoV-2 infection and should not be used as the sole basis for treatment or other patient management decisions, including infection  control decisions, particularly in the presence of clinical signs and  symptoms consistent with COVID-19, or in those who have been in contact with the virus.  Negative results must be combined with clinical observations, patient history, and epidemiological information. The expected result is Negative.  Fact Sheet for Patients: HandmadeRecipes.com.cy  Fact Sheet for Healthcare Providers: FuneralLife.at  This test is not yet approved or cleared by the [place] FDA and  has been authorized for detection and/or diagnosis of SARS-CoV-2 by FDA under an Emergency Use Authorization (EUA).  This EUA will remain in effect (meaning this test can be used) for the duration of  the COV                          ID-19 declaration under Section 564(b)(1) of the Act, 21 U.S.C. section 360bbb-3(b)(1), unless the authorization is terminated or revoked sooner.     Preg Test, Ur 04/16/2021 NEGATIVE  NEGATIVE Final   Comment:        THE SENSITIVITY OF THIS METHODOLOGY IS >24 mIU/mL     Allergies: Patient  has no known allergies.  PTA Medications: (Not in a hospital admission)   Medical Decision Making  Patient is an 20 year old female with past psychiatric and medical history as stated above who presents to the [facility] behavioral health urgent care as a voluntary walk-in for worsening depression, anxiety, and SI (see HPI for details).  Although patient denies SI currently, due to safety concerns based on patient's disclosure of recent SI noted above, recommend continuous assessment for the patient at this time.    Recommendations  Based on my evaluation the patient does not appear to have an emergency medical condition.  Patient will be admitted to [facility] continuous assessment for further stabilization and treatment.  Patient will be reevaluated by the treatment team on 04/17/2021 and disposition to be determined at that time.  Labs ordered and reviewed:  -UDS: Negative for all substances  -Urine pregnancy: Negative  -CBC with differential: Collected, results pending  -CMP: Collected, results pending  -Hemoglobin A1c: Collected, results pending  -TSH: Collected, results pending  -Lipid panel: Collected, results pending  Discussed with the patient potentially initiating scheduled psychotropic medications to help target patient's depressive symptoms and anxiety.  After discussion with the patient, patient reports that she would not like to initiate scheduled psychotropic medications at this time and would prefer to wait until discussion with dayshift psychiatric provider on 04/17/2021 reevaluation to discuss potential initiation of scheduled psychotropic medications at that time.  Thus, no scheduled psychotropic medications initiated at this time.  Vistaril 25 mg p.o. 3 times daily as needed ordered for anxiety. Trazodone 50 mg p.o. at bedtime as needed ordered for sleep.  Patient educated on side effect profiles of Vistaril and trazodone.  Prescilla Sours, PA-C  04/17/21  4:11 AM

## 2021-04-16 NOTE — Progress Notes (Signed)
   04/16/21 2149  [facility] Triage Screening (Walk-ins at [facility] only)  How Did You Hear About [place]? Other (Comment) (Mobile Crisis)  What Is the Reason for Your Visit/Call Today? Julie Rios is a 20yo female presenting to [facility] for evaluation of suicidal ideation with plan to cut self with knife. Pt was accompanied initially by boyfriend and mobile crisis. Pt reports that today she expressed to her uncle that she was having suicidal thoughts yesterday and her uncle told pts grandmother.  Pts grandmother was concerned and called mobile crisis. After pt was evaluated by mobile crisis--mobile crisis recommended that pt come to [facility] for evaluation. Pt admits that she had the SI yesterday with a plan to cut self with knife but has no intent to follow through. Pt reports she was feeling sad because both of her parents are deceased and the holiday season is a big trigger for her. Pts mother passed away 4 years ago and her father passed April 2022. Pt is currently not receiving any kind of outpatient psychiatric supports or counseling. Pt denies HI, AVH, or substance use. Pt is currently working full time at [employer] and is under a lot of job-related stress.  How Long Has This Been Causing You Problems? <Week  Have You Recently Had Any Thoughts About Hurting Yourself? Yes  How long ago did you have thoughts about hurting yourself? yesterday--thoughts with plans of cutting self with knife  Are You Planning to Commit Suicide/Harm Yourself At This time? No  Have you Recently Had Thoughts About Hurting Someone [name]? No  Are You Planning To Harm Someone At This Time? No  Are you currently experiencing any auditory, visual or other hallucinations? No  Have You Used Any Alcohol or Drugs in the Past 24 Hours? No  Do you have any current medical co-morbidities that require immediate attention? No  What Do You Feel Would Help You the Most Today? Treatment for Depression or other mood problem  If access to [facility] Urgent Care was not  available, would you have sought care in the Emergency Department? Yes  Determination of Need Urgent (48 hours)  Options For Referral Inpatient Hospitalization;Medication Management;Outpatient Therapy
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
## 2021-04-17 DIAGNOSIS — Z634 Disappearance and death of family member: Secondary | ICD-10-CM | POA: Diagnosis not present

## 2021-04-17 DIAGNOSIS — F339 Major depressive disorder, recurrent, unspecified: Secondary | ICD-10-CM | POA: Diagnosis present

## 2021-04-17 DIAGNOSIS — F419 Anxiety disorder, unspecified: Secondary | ICD-10-CM | POA: Diagnosis not present

## 2021-04-17 DIAGNOSIS — F332 Major depressive disorder, recurrent severe without psychotic features: Secondary | ICD-10-CM | POA: Diagnosis present

## 2021-04-17 DIAGNOSIS — F322 Major depressive disorder, single episode, severe without psychotic features: Secondary | ICD-10-CM | POA: Diagnosis not present

## 2021-04-17 DIAGNOSIS — Z20822 Contact with and (suspected) exposure to covid-19: Secondary | ICD-10-CM | POA: Diagnosis not present

## 2021-04-17 LAB — CBC WITH DIFFERENTIAL/PLATELET
Abs Immature Granulocytes: 0.04 10*3/uL (ref 0.00–0.07)
Basophils Absolute: 0.1 10*3/uL (ref 0.0–0.1)
Basophils Relative: 1 %
Eosinophils Absolute: 0.8 10*3/uL — ABNORMAL HIGH (ref 0.0–0.5)
Eosinophils Relative: 11 %
HCT: 43.4 % (ref 36.0–46.0)
Hemoglobin: 14 g/dL (ref 12.0–15.0)
Immature Granulocytes: 1 %
Lymphocytes Relative: 30 %
Lymphs Abs: 2.3 10*3/uL (ref 0.7–4.0)
MCH: 27.9 pg (ref 26.0–34.0)
MCHC: 32.3 g/dL (ref 30.0–36.0)
MCV: 86.6 fL (ref 80.0–100.0)
Monocytes Absolute: 0.5 10*3/uL (ref 0.1–1.0)
Monocytes Relative: 6 %
Neutro Abs: 3.9 10*3/uL (ref 1.7–7.7)
Neutrophils Relative %: 51 %
Platelets: UNDETERMINED 10*3/uL (ref 150–400)
RBC: 5.01 MIL/uL (ref 3.87–5.11)
RDW: 13.6 % (ref 11.5–15.5)
WBC: 7.6 10*3/uL (ref 4.0–10.5)
nRBC: 0 % (ref 0.0–0.2)

## 2021-04-17 LAB — COMPREHENSIVE METABOLIC PANEL
ALT: 19 U/L (ref 0–44)
AST: 18 U/L (ref 15–41)
Albumin: 4 g/dL (ref 3.5–5.0)
Alkaline Phosphatase: 57 U/L (ref 38–126)
Anion gap: 8 (ref 5–15)
BUN: 5 mg/dL — ABNORMAL LOW (ref 6–20)
CO2: 23 mmol/L (ref 22–32)
Calcium: 9.3 mg/dL (ref 8.9–10.3)
Chloride: 108 mmol/L (ref 98–111)
Creatinine, Ser: 0.64 mg/dL (ref 0.44–1.00)
GFR, Estimated: >60 mL/min (ref >60)
Glucose, Bld: 85 mg/dL (ref 70–99)
Potassium: 4.4 mmol/L (ref 3.5–5.1)
Sodium: 139 mmol/L (ref 135–145)
Total Bilirubin: 0.4 mg/dL (ref 0.3–1.2)
Total Protein: 6.9 g/dL (ref 6.5–8.1)

## 2021-04-17 LAB — LIPID PANEL
Cholesterol: 150 mg/dL (ref 0–200)
HDL: 38 mg/dL — ABNORMAL LOW (ref >40)
LDL Cholesterol: 98 mg/dL (ref 0–99)
Total CHOL/HDL Ratio: 3.9 RATIO
Triglycerides: 71 mg/dL (ref <150)
VLDL: 14 mg/dL (ref 0–40)

## 2021-04-17 LAB — HEMOGLOBIN A1C
Hgb A1c MFr Bld: 5.1 % (ref 4.8–5.6)
Mean Plasma Glucose: 99.67 mg/dL

## 2021-04-17 LAB — TSH: TSH: 1.495 u[IU]/mL (ref 0.350–4.500)

## 2021-04-17 LAB — RESP PANEL BY RT-PCR (FLU A&B, COVID) ARPGX2
Influenza A by PCR: NEGATIVE
Influenza B by PCR: NEGATIVE
SARS Coronavirus 2 by RT PCR: NEGATIVE

## 2021-04-17 MED ORDER — FLUOXETINE HCL 10 MG PO CAPS
10.0000 mg | ORAL_CAPSULE | Freq: Every day | ORAL | Status: DC
  Administered 2021-04-17 – 2021-04-18 (×2): 10 mg via ORAL
  Filled 2021-04-17 (×2): qty 1

## 2021-04-17 NOTE — ED Notes (Signed)
Resting quietly in recliner.  Denies SI thoughts at this time. Denies HI and AVH.  Breathing is even and unlabored.  Will continue to monitor for safety.

## 2021-04-17 NOTE — ED Notes (Signed)
Pt sitting in [facility] dining room during assessment. A&Ox4, calm and cooperative with no signs of acute distress. Denies SI, HI, and AVH at this time. Will continue to monitor for safety.
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
## 2021-04-17 NOTE — ED Notes (Signed)
Pt lying in bed. Calm and no signs of acute distress. All needs currently satisfied per pt. Will continue to monitor for safety.

## 2021-04-17 NOTE — ED Notes (Signed)
Patient admitted to the [facility] unit, given orientation. Patient denies SI, HI and AVH on admission, pain 0/10. Skin is WDL and intact. Patient pleasant with affect flat. Patient does not show objective signs of responding to internal stimulus.  Patient signed consent forms. Patient verbally agreed to inform staff if she has harmful thoughts. Nursing staff will continue to monitor.

## 2021-04-17 NOTE — ED Provider Notes (Signed)
Behavioral Health Progress Note  Date and Time: 04/17/2021 8:15 AM Name: Julie Rios MRN:  [id number]  Subjective:  Julie Rios is a 20 y.o. female who presents to the [facility] behavioral health urgent care [facility]) as a voluntary walk-in for worsening depression, anxiety, and SI.    Patient seen and reevaluated face-to-face by this provider, chart reviewed and case discussed with Dr. Serafina Mitchell. On evaluation, patient is alert and oriented x4. Her thought process is logical and speech is clear and coherent. Her mood is depressed and affect is congruent.  Patient reports that she's been under a lot of stress at work for the last 2 days. She reports that yesterday she cried so much while at work thinking about her parents. She reports that both of her parents died and she thinks about them a lot, especially close to the holidays. She reports having suicidal thoughts for the past year. She describes the suicidal thoughts as thoughts to slit her wrist and and then get into the tub so that she does not make a mess. She states that they are just thoughts and that she does not think she would be able to commit suicide. She denies a past hx of attempting suicide. When asked what prevents her from committing suicide, she states that she have 3 siblings that she loves that prevents her from committing suicide. She states that cleaning and working distracts the suicidal thoughts. She reports feeling depressed since her mother died four years ago. She describes her depressive symptoms as sadness, hopelessness, worthlessness, decreased energy level, and guilt. She reports a poor appetite. She reports fair sleep.   Today, she denies having thoughts of wanting to kill herself or others. She denies hearing voices or seeing things that other people cannot hear or see. There is no evidence that she is responding to internal or external stimuli. She denies drinking alcohol or using illicit drugs.    I discussed the  risks and benefits of taking an antidepressant for treatment of depression. I discussed with the patient starting fluoxetine 10 mg by mouth daily for depression. Patient verbalizes understanding and agrees to the stated plan.  Diagnosis:  Final diagnoses:  MDD (major depressive disorder), single episode, severe , no psychosis ([place])  Anxiety  Bereavement    Total Time spent with patient: 20 minutes  Past Psychiatric History:   No significant past psychiatric history.   Past Medical History: No past medical history on file. No past surgical history on file. Family History:  Family History  Problem Relation Age of Onset   Diabetes Mother    Cirrhosis Mother    Family Psychiatric  History: biological sister has MDD, GAD and suicide attempt Social History:  Social History   Substance and Sexual Activity  Alcohol Use No     Social History   Substance and Sexual Activity  Drug Use No    Social History   Socioeconomic History   Marital status: Single    Spouse name: Not on file   Number of children: Not on file   Years of education: Not on file   Highest education level: Not on file  Occupational History   Not on file  Tobacco Use   Smoking status: Passive Smoke Exposure - Never Smoker   Smokeless tobacco: Never  Substance and Sexual Activity   Alcohol use: No   Drug use: No   Sexual activity: Not Currently  Other Topics Concern   Not on file  Social History Narrative  Not on file   Social Determinants of Health   Financial Resource Strain: Not on file  Food Insecurity: Not on file  Transportation Needs: Not on file  Physical Activity: Not on file  Stress: Not on file  Social Connections: Not on file   SDOH:  SDOH Screenings   Alcohol Screen: Not on file  Depression (PHQ2-9): Not on file  Financial Resource Strain: Not on file  Food Insecurity: Not on file  Housing: Not on file  Physical Activity: Not on file  Social Connections: Not on file  Stress:  Not on file  Tobacco Use: Medium Risk   Smoking Tobacco Use: Passive Smoke Exposure - Never Smoker   Smokeless Tobacco Use: Never   Passive Exposure: Yes  Transportation Needs: Not on file   Additional Social History:    Pain Medications: see MAR Prescriptions: see MAR Over the Counter: see MAR History of alcohol / drug use?: No history of alcohol / drug abuse   Current Medications:  Current Facility-Administered Medications  Medication Dose Route Frequency Provider Last Rate Last Admin   acetaminophen (TYLENOL) tablet 650 mg  650 mg Oral Q6H PRN Prescilla Sours, PA-C       alum & mag hydroxide-simeth (MAALOX/MYLANTA) 200-200-20 MG/5ML suspension 30 mL  30 mL Oral Q4H PRN Margorie John W, PA-C       hydrOXYzine (ATARAX) tablet 25 mg  25 mg Oral TID PRN Margorie John W, PA-C       magnesium hydroxide (MILK OF MAGNESIA) suspension 30 mL  30 mL Oral Daily PRN Margorie John W, PA-C       traZODone (DESYREL) tablet 50 mg  50 mg Oral QHS PRN Prescilla Sours, PA-C       Current Outpatient Medications  Medication Sig Dispense Refill   ondansetron (ZOFRAN) 4 MG tablet Take 1-2 tablets (4-8 mg total) by mouth every 6 (six) hours. 20 tablet 0   traMADol-acetaminophen (ULTRACET) 37.5-325 MG tablet Take 1-2 tablets by mouth every 6 (six) hours as needed (headache). 20 tablet 0    Labs  Lab Results:  Admission on 04/16/2021  Component Date Value Ref Range Status   SARS Coronavirus 2 by RT PCR 04/16/2021 NEGATIVE  NEGATIVE Final   Comment: (NOTE) SARS-CoV-2 target nucleic acids are NOT DETECTED.  The SARS-CoV-2 RNA is generally detectable in upper respiratory specimens during the acute phase of infection. The lowest concentration of SARS-CoV-2 viral copies this assay can detect is 138 copies/mL. A negative result does not preclude SARS-Cov-2 infection and should not be used as the sole basis for treatment or other patient management decisions. A negative result may occur with  improper  specimen collection/handling, submission of specimen other than nasopharyngeal swab, presence of viral mutation(s) within the areas targeted by this assay, and inadequate number of viral copies(<138 copies/mL). A negative result must be combined with clinical observations, patient history, and epidemiological information. The expected result is Negative.  Fact Sheet for Patients:  EntrepreneurPulse.com.au  Fact Sheet for Healthcare Providers:  IncredibleEmployment.be  This test is no                          t yet approved or cleared by the [place] FDA and  has been authorized for detection and/or diagnosis of SARS-CoV-2 by FDA under an Emergency Use Authorization (EUA). This EUA will remain  in effect (meaning this test can be used) for the duration of the COVID-19 declaration under Section  564(b)(1) of the Act, 21 U.S.C.section 360bbb-3(b)(1), unless the authorization is terminated  or revoked sooner.       Influenza A by PCR 04/16/2021 NEGATIVE  NEGATIVE Final   Influenza B by PCR 04/16/2021 NEGATIVE  NEGATIVE Final   Comment: (NOTE) The Xpert Xpress SARS-CoV-2/FLU/RSV plus assay is intended as an aid in the diagnosis of influenza from Nasopharyngeal swab specimens and should not be used as a sole basis for treatment. Nasal washings and aspirates are unacceptable for Xpert Xpress SARS-CoV-2/FLU/RSV testing.  Fact Sheet for Patients: EntrepreneurPulse.com.au  Fact Sheet for Healthcare Providers: IncredibleEmployment.be  This test is not yet approved or cleared by the [place] FDA and has been authorized for detection and/or diagnosis of SARS-CoV-2 by FDA under an Emergency Use Authorization (EUA). This EUA will remain in effect (meaning this test can be used) for the duration of the COVID-19 declaration under Section 564(b)(1) of the Act, 21 U.S.C. section 360bbb-3(b)(1), unless the  authorization is terminated or revoked.  Performed at [facility], [place] [address], [place], [place] [address]    SARS Coronavirus 2 Ag 04/16/2021 Negative  Negative Preliminary   POC Amphetamine UR 04/16/2021 None Detected  NONE DETECTED (Cut Off Level 1000 ng/mL) Preliminary   POC Secobarbital (BAR) 04/16/2021 None Detected  NONE DETECTED (Cut Off Level 300 ng/mL) Preliminary   POC Buprenorphine (BUP) 04/16/2021 None Detected  NONE DETECTED (Cut Off Level 10 ng/mL) Preliminary   POC Oxazepam (BZO) 04/16/2021 None Detected  NONE DETECTED (Cut Off Level 300 ng/mL) Preliminary   POC Cocaine UR 04/16/2021 None Detected  NONE DETECTED (Cut Off Level 300 ng/mL) Preliminary   POC Methamphetamine UR 04/16/2021 None Detected  NONE DETECTED (Cut Off Level 1000 ng/mL) Preliminary   POC Morphine 04/16/2021 None Detected  NONE DETECTED (Cut Off Level 300 ng/mL) Preliminary   POC Oxycodone UR 04/16/2021 None Detected  NONE DETECTED (Cut Off Level 100 ng/mL) Preliminary   POC Methadone UR 04/16/2021 None Detected  NONE DETECTED (Cut Off Level 300 ng/mL) Preliminary   POC Marijuana UR 04/16/2021 None Detected  NONE DETECTED (Cut Off Level 50 ng/mL) Preliminary   SARSCOV2ONAVIRUS 2 AG 04/16/2021 NEGATIVE  NEGATIVE Final   Comment: (NOTE) SARS-CoV-2 antigen NOT DETECTED.   Negative results are presumptive.  Negative results do not preclude SARS-CoV-2 infection and should not be used as the sole basis for treatment or other patient management decisions, including infection  control decisions, particularly in the presence of clinical signs and  symptoms consistent with COVID-19, or in those who have been in contact with the virus.  Negative results must be combined with clinical observations, patient history, and epidemiological information. The expected result is Negative.  Fact Sheet for Patients: HandmadeRecipes.com.cy  Fact Sheet for Healthcare  Providers: FuneralLife.at  This test is not yet approved or cleared by the [place] FDA and  has been authorized for detection and/or diagnosis of SARS-CoV-2 by FDA under an Emergency Use Authorization (EUA).  This EUA will remain in effect (meaning this test can be used) for the duration of  the COV                          ID-19 declaration under Section 564(b)(1) of the Act, 21 U.S.C. section 360bbb-3(b)(1), unless the authorization is terminated or revoked sooner.     Preg Test, Ur 04/16/2021 NEGATIVE  NEGATIVE Final   Comment:        THE SENSITIVITY OF THIS METHODOLOGY  IS >24 mIU/mL     Blood Alcohol level:  No results found for: [facility]  Metabolic Disorder Labs: No results found for: HGBA1C, MPG No results found for: PROLACTIN No results found for: CHOL, TRIG, HDL, CHOLHDL, VLDL, LDLCALC  Therapeutic Lab Levels: No results found for: LITHIUM No results found for: VALPROATE No components found for:  CBMZ  Physical Findings   Flowsheet Row ED from 04/16/2021 in [facility] ED from 02/12/2021 in [facility]  ED from 02/05/2021 in [facility] Moderate Risk No Risk No Risk        Musculoskeletal  Strength & Muscle Tone: within normal limits Gait & Station: normal Patient leans: N/A  Psychiatric Specialty Exam  Presentation  General Appearance: Appropriate for Environment  Eye Contact:Fair  Speech:Clear and Coherent  Speech Volume:Normal  Handedness:No data recorded  Mood and Affect  Mood:Depressed  Affect:Congruent   Thought Process  Thought Processes:Coherent  Descriptions of Associations:Intact  Orientation:Full (Time, Place and Person)  Thought Content:Logical  Diagnosis of Schizophrenia or Schizoaffective disorder in past: No    Hallucinations:Hallucinations: None  Ideas of  Reference:None  Suicidal Thoughts:Suicidal Thoughts: No  Homicidal Thoughts:Homicidal Thoughts: No   Sensorium  Memory:Immediate Fair; Remote Fair; Recent Fair  Judgment:Fair  Insight:Fair   Executive Functions  Concentration:Fair  Attention Span:Fair  [place]   Psychomotor Activity  Psychomotor Activity:Psychomotor Activity: Normal   Assets  Assets:Communication Skills; Desire for Improvement; Financial Resources/Insurance; Housing; Leisure Time; Intimacy; Physical Health; Resilience; Social Support; Talents/Skills   Sleep  Sleep:Sleep: Fair Number of Hours of Sleep: 5   Nutritional Assessment (For OBS and [facility] admissions only) Has the patient had a weight loss or gain of 10 pounds or more in the last 3 months?: -- (Patient is unsure.) Has the patient had a decrease in food intake/or appetite?: Yes Does the patient have dental problems?: No Does the patient have eating habits or behaviors that may be indicators of an eating disorder including binging or inducing vomiting?: No Has the patient recently lost weight without trying?: 2.0 Has the patient been eating poorly because of a decreased appetite?: 1 Malnutrition Screening Tool Score: 3 Nutritional Assessment Referrals: Refer to Primary Care Provider    Physical Exam  Physical Exam Constitutional:      Appearance: Normal appearance.  HENT:     Head: Normocephalic and atraumatic.     Nose: Nose normal.  Eyes:     Conjunctiva/sclera: Conjunctivae normal.  Cardiovascular:     Rate and Rhythm: Normal rate.  Pulmonary:     Effort: Pulmonary effort is normal.  Musculoskeletal:        General: Normal range of motion.     Cervical back: Normal range of motion.  Neurological:     Mental Status: She is alert and oriented to person, place, and time.   Review of Systems  Constitutional: Negative.   HENT: Negative.    Eyes: Negative.   Respiratory: Negative.     Cardiovascular: Negative.   Gastrointestinal: Negative.   Genitourinary: Negative.   Musculoskeletal: Negative.   Skin: Negative.   Neurological: Negative.   Endo/Heme/Allergies: Negative.   Psychiatric/Behavioral:  Positive for depression.   Blood pressure 117/84, pulse 88, temperature 97.6 F (36.4 C), temperature source Oral, resp. rate 18, SpO2 100 %. There is no height or weight on file to calculate BMI.  Treatment Plan Summary: Patient admitted to the  [facility] facility based crisis unit for mood stabilization and safety. Patient consents to treatment. Patient is voluntary.   Medications:  Start fluoxetine 10 mg by mouth daily for depression. Continue trazodone 50 mg by mouth nightly as needed for sleep Continue Vistaril 25 mg by mouth 3 times a day as needed for anxiety  Labs reordered CMP CBC A1c  Lipid panel TSH  Julie Houdek L, NP 04/17/2021 8:15 AM

## 2021-04-17 NOTE — ED Notes (Signed)
Pt sleeping@this time. Breathing even and unlabored. Will continue to monitor for safety 

## 2021-04-17 NOTE — Progress Notes (Signed)
Pt is watching TV quietly. No distress noted. Pt's safety is maintained.

## 2021-04-18 DIAGNOSIS — F419 Anxiety disorder, unspecified: Secondary | ICD-10-CM | POA: Diagnosis not present

## 2021-04-18 DIAGNOSIS — Z634 Disappearance and death of family member: Secondary | ICD-10-CM | POA: Diagnosis not present

## 2021-04-18 DIAGNOSIS — F322 Major depressive disorder, single episode, severe without psychotic features: Secondary | ICD-10-CM | POA: Diagnosis not present

## 2021-04-18 DIAGNOSIS — Z20822 Contact with and (suspected) exposure to covid-19: Secondary | ICD-10-CM | POA: Diagnosis not present

## 2021-04-18 NOTE — ED Provider Notes (Signed)
Behavioral Health Progress Note  Date and Time: 04/18/2021 8:47 AM Name: Julie Rios MRN:  [id number]  Subjective: Julie Rios is a 20 year old female with no significant past psychiatric history and past medical history significant for dyslexia and dog bite of her right lower extremity on 02/02/2021 (Per patient and chart review, patient is up-to-date on all of her rabies vaccinations and obtain final rabies vaccination 02/19/2021, patient reports that this dog bite has healed appropriately), who presents to the [facility] behavioral health urgent care [facility]) as a voluntary walk-in for worsening depression, anxiety, and SI on 12/8 after becoming overwhelmed earlier in the day at work in the context of recent loss of her father 8 months ago.  Patient seen and reevaluated face-to-face by this provider, and chart reviewed. On evaluation, patient is alert and oriented x4. Her thought process is logical and speech is clear and coherent. Her mood is dysphoric and affect is congruent. Patient denies SI/HI/AVH.  Patient states that she is no longer feeling like she was when she first came in. She reports feeling a lot better. She denies depressive symptoms today. She denies feeling anxious. She reports fair sleep. She reports a fair appetite and states that she ate 2 meals yesterday. She reports that she contacted her grandmother last night and the conversation went well. She reports that she was able to speak to her siblings and they are all doing well. She states that her little sister misses her. She states that her grandmother is concerned about her job and is going to bring paperwork for her to fill out today for her job. She states that her boyfriend birthday is today but the two of them will celebrate their birthdays together on her birthday Tuesday at [employer]. Patient states that she is compliant with taking the Prozac without any side. Discussed with the patient increasing Prozac to 20 mg by  mouth daily tomorrow. Patient verbalizes understanding.   Diagnosis:  Final diagnoses:  MDD (major depressive disorder), single episode, severe , no psychosis ([place])  Anxiety  Bereavement    Total Time spent with patient: 15 minutes  Past Psychiatric History: no significant past psychiatric history  Past Medical History: No past medical history on file. No past surgical history on file. Family History:  Family History  Problem Relation Age of Onset   Diabetes Mother    Cirrhosis Mother    Family Psychiatric  History:  35 yo sister with depression and anxiety; has a history of a suicide attempt Denies history of completed suicides Denies history of substance abuse Social History:  Social History   Substance and Sexual Activity  Alcohol Use No     Social History   Substance and Sexual Activity  Drug Use No    Social History   Socioeconomic History   Marital status: Single    Spouse name: Not on file   Number of children: Not on file   Years of education: Not on file   Highest education level: Not on file  Occupational History   Not on file  Tobacco Use   Smoking status: Passive Smoke Exposure - Never Smoker   Smokeless tobacco: Never  Substance and Sexual Activity   Alcohol use: No   Drug use: No   Sexual activity: Not Currently  Other Topics Concern   Not on file  Social History Narrative   Not on file   Social Determinants of Health   Financial Resource Strain: Not on file  Food Insecurity: Not  on file  Transportation Needs: Not on file  Physical Activity: Not on file  Stress: Not on file  Social Connections: Not on file   SDOH:  SDOH Screenings   Alcohol Screen: Not on file  Depression (PHQ2-9): Not on file  Financial Resource Strain: Not on file  Food Insecurity: Not on file  Housing: Not on file  Physical Activity: Not on file  Social Connections: Not on file  Stress: Not on file  Tobacco Use: Medium Risk   Smoking Tobacco Use: Passive  Smoke Exposure - Never Smoker   Smokeless Tobacco Use: Never   Passive Exposure: Yes  Transportation Needs: Not on file   Additional Social History:    Pain Medications: see MAR Prescriptions: see MAR Over the Counter: see MAR History of alcohol / drug use?: No history of alcohol / drug abuse    Current Medications:  Current Facility-Administered Medications  Medication Dose Route Frequency Provider Last Rate Last Admin   acetaminophen (TYLENOL) tablet 650 mg  650 mg Oral Q6H PRN Prescilla Sours, PA-C       alum & mag hydroxide-simeth (MAALOX/MYLANTA) 200-200-20 MG/5ML suspension 30 mL  30 mL Oral Q4H PRN Lovena Le, Cody W, PA-C       FLUoxetine (PROZAC) capsule 10 mg  10 mg Oral Daily Cephus Tupy L, NP   10 mg at 04/17/21 1043   hydrOXYzine (ATARAX) tablet 25 mg  25 mg Oral TID PRN Prescilla Sours, PA-C       magnesium hydroxide (MILK OF MAGNESIA) suspension 30 mL  30 mL Oral Daily PRN Margorie John W, PA-C       traZODone (DESYREL) tablet 50 mg  50 mg Oral QHS PRN Prescilla Sours, PA-C       Current Outpatient Medications  Medication Sig Dispense Refill   ondansetron (ZOFRAN) 4 MG tablet Take 1-2 tablets (4-8 mg total) by mouth every 6 (six) hours. 20 tablet 0   traMADol-acetaminophen (ULTRACET) 37.5-325 MG tablet Take 1-2 tablets by mouth every 6 (six) hours as needed (headache). 20 tablet 0    Labs  Lab Results:  Admission on 04/16/2021  Component Date Value Ref Range Status   SARS Coronavirus 2 by RT PCR 04/16/2021 NEGATIVE  NEGATIVE Final   Comment: (NOTE) SARS-CoV-2 target nucleic acids are NOT DETECTED.  The SARS-CoV-2 RNA is generally detectable in upper respiratory specimens during the acute phase of infection. The lowest concentration of SARS-CoV-2 viral copies this assay can detect is 138 copies/mL. A negative result does not preclude SARS-Cov-2 infection and should not be used as the sole basis for treatment or other patient management decisions. A negative result  may occur with  improper specimen collection/handling, submission of specimen other than nasopharyngeal swab, presence of viral mutation(s) within the areas targeted by this assay, and inadequate number of viral copies(<138 copies/mL). A negative result must be combined with clinical observations, patient history, and epidemiological information. The expected result is Negative.  Fact Sheet for Patients:  EntrepreneurPulse.com.au  Fact Sheet for Healthcare Providers:  IncredibleEmployment.be  This test is no                          t yet approved or cleared by the [place] FDA and  has been authorized for detection and/or diagnosis of SARS-CoV-2 by FDA under an Emergency Use Authorization (EUA). This EUA will remain  in effect (meaning this test can be used) for the duration of the COVID-19  declaration under Section 564(b)(1) of the Act, 21 U.S.C.section 360bbb-3(b)(1), unless the authorization is terminated  or revoked sooner.       Influenza A by PCR 04/16/2021 NEGATIVE  NEGATIVE Final   Influenza B by PCR 04/16/2021 NEGATIVE  NEGATIVE Final   Comment: (NOTE) The Xpert Xpress SARS-CoV-2/FLU/RSV plus assay is intended as an aid in the diagnosis of influenza from Nasopharyngeal swab specimens and should not be used as a sole basis for treatment. Nasal washings and aspirates are unacceptable for Xpert Xpress SARS-CoV-2/FLU/RSV testing.  Fact Sheet for Patients: EntrepreneurPulse.com.au  Fact Sheet for Healthcare Providers: IncredibleEmployment.be  This test is not yet approved or cleared by the [place] FDA and has been authorized for detection and/or diagnosis of SARS-CoV-2 by FDA under an Emergency Use Authorization (EUA). This EUA will remain in effect (meaning this test can be used) for the duration of the COVID-19 declaration under Section 564(b)(1) of the Act, 21 U.S.C. section  360bbb-3(b)(1), unless the authorization is terminated or revoked.  Performed at [facility], [place] [address] [place], [place] [address]    SARS Coronavirus 2 Ag 04/16/2021 Negative  Negative Preliminary   POC Amphetamine UR 04/16/2021 None Detected  NONE DETECTED (Cut Off Level 1000 ng/mL) Preliminary   POC Secobarbital (BAR) 04/16/2021 None Detected  NONE DETECTED (Cut Off Level 300 ng/mL) Preliminary   POC Buprenorphine (BUP) 04/16/2021 None Detected  NONE DETECTED (Cut Off Level 10 ng/mL) Preliminary   POC Oxazepam (BZO) 04/16/2021 None Detected  NONE DETECTED (Cut Off Level 300 ng/mL) Preliminary   POC Cocaine UR 04/16/2021 None Detected  NONE DETECTED (Cut Off Level 300 ng/mL) Preliminary   POC Methamphetamine UR 04/16/2021 None Detected  NONE DETECTED (Cut Off Level 1000 ng/mL) Preliminary   POC Morphine 04/16/2021 None Detected  NONE DETECTED (Cut Off Level 300 ng/mL) Preliminary   POC Oxycodone UR 04/16/2021 None Detected  NONE DETECTED (Cut Off Level 100 ng/mL) Preliminary   POC Methadone UR 04/16/2021 None Detected  NONE DETECTED (Cut Off Level 300 ng/mL) Preliminary   POC Marijuana UR 04/16/2021 None Detected  NONE DETECTED (Cut Off Level 50 ng/mL) Preliminary   SARSCOV2ONAVIRUS 2 AG 04/16/2021 NEGATIVE  NEGATIVE Final   Comment: (NOTE) SARS-CoV-2 antigen NOT DETECTED.   Negative results are presumptive.  Negative results do not preclude SARS-CoV-2 infection and should not be used as the sole basis for treatment or other patient management decisions, including infection  control decisions, particularly in the presence of clinical signs and  symptoms consistent with COVID-19, or in those who have been in contact with the virus.  Negative results must be combined with clinical observations, patient history, and epidemiological information. The expected result is Negative.  Fact Sheet for Patients: HandmadeRecipes.com.cy  Fact Sheet for Healthcare  Providers: FuneralLife.at  This test is not yet approved or cleared by the [place] FDA and  has been authorized for detection and/or diagnosis of SARS-CoV-2 by FDA under an Emergency Use Authorization (EUA).  This EUA will remain in effect (meaning this test can be used) for the duration of  the COV                          ID-19 declaration under Section 564(b)(1) of the Act, 21 U.S.C. section 360bbb-3(b)(1), unless the authorization is terminated or revoked sooner.     Preg Test, Ur 04/16/2021 NEGATIVE  NEGATIVE Final   Comment:        THE SENSITIVITY  OF THIS METHODOLOGY IS >24 mIU/mL    WBC 04/17/2021 7.6  4.0 - 10.5 K/uL Final   Comment: REPEATED TO VERIFY Drae Mitzel COUNT CONFIRMED ON SMEAR    RBC 04/17/2021 5.01  3.87 - 5.11 MIL/uL Final   Hemoglobin 04/17/2021 14.0  12.0 - 15.0 g/dL Final   HCT 16/02/9603 43.4  36.0 - 46.0 % Final   MCV 04/17/2021 86.6  80.0 - 100.0 fL Final   MCH 04/17/2021 27.9  26.0 - 34.0 pg Final   MCHC 04/17/2021 32.3  30.0 - 36.0 g/dL Final   RDW 54/01/8118 13.6  11.5 - 15.5 % Final   Platelets 04/17/2021 PLATELET CLUMPS NOTED ON SMEAR, UNABLE TO ESTIMATE  150 - 400 K/uL Final   PLATELET CLUMPS NOTED ON SMEAR, UNABLE TO ESTIMATE   nRBC 04/17/2021 0.0  0.0 - 0.2 % Final   Neutrophils Relative % 04/17/2021 51  % Final   Neutro Abs 04/17/2021 3.9  1.7 - 7.7 K/uL Final   Lymphocytes Relative 04/17/2021 30  % Final   Lymphs Abs 04/17/2021 2.3  0.7 - 4.0 K/uL Final   Monocytes Relative 04/17/2021 6  % Final   Monocytes Absolute 04/17/2021 0.5  0.1 - 1.0 K/uL Final   Eosinophils Relative 04/17/2021 11  % Final   Eosinophils Absolute 04/17/2021 0.8 (H)  0.0 - 0.5 K/uL Final   Basophils Relative 04/17/2021 1  % Final   Basophils Absolute 04/17/2021 0.1  0.0 - 0.1 K/uL Final   Immature Granulocytes 04/17/2021 1  % Final   Abs Immature Granulocytes 04/17/2021 0.04  0.00 - 0.07 K/uL Final   Performed at [facility]  Lab, [address], [place], [place] [address]   Sodium 04/17/2021 139  135 - 145 mmol/L Final   Potassium 04/17/2021 4.4  3.5 - 5.1 mmol/L Final   Chloride 04/17/2021 108  98 - 111 mmol/L Final   CO2 04/17/2021 23  22 - 32 mmol/L Final   Glucose, Bld 04/17/2021 85  70 - 99 mg/dL Final   Glucose reference range applies only to samples taken after fasting for at least 8 hours.   BUN 04/17/2021 5 (L)  6 - 20 mg/dL Final   Creatinine, Ser 04/17/2021 0.64  0.44 - 1.00 mg/dL Final   Calcium 95/62/1308 9.3  8.9 - 10.3 mg/dL Final   Total Protein 65/78/4696 6.9  6.5 - 8.1 g/dL Final   Albumin 29/52/8413 4.0  3.5 - 5.0 g/dL Final   AST 24/40/1027 18  15 - 41 U/L Final   ALT 04/17/2021 19  0 - 44 U/L Final   Alkaline Phosphatase 04/17/2021 57  38 - 126 U/L Final   Total Bilirubin 04/17/2021 0.4  0.3 - 1.2 mg/dL Final   GFR, Estimated 04/17/2021 >60  >60 mL/min Final   Comment: (NOTE) Calculated using the CKD-EPI Creatinine Equation (2021)    Anion gap 04/17/2021 8  5 - 15 Final   Performed at [facility], [address], [place], [place] [address]   Hgb A1c MFr Bld 04/17/2021 5.1  4.8 - 5.6 % Final   Comment: (NOTE) Pre diabetes:          5.7%-6.4%  Diabetes:              >6.4%  Glycemic control for   <7.0% adults with diabetes    Mean Plasma Glucose 04/17/2021 99.67  mg/dL Final   Performed at [facility], [address], [place], [place] [address]  Cholesterol 04/17/2021 150  0 - 200 mg/dL Final   Triglycerides 04/17/2021 71  <150 mg/dL Final   HDL 04/17/2021 38 (L)  >40 mg/dL Final   Total CHOL/HDL Ratio 04/17/2021 3.9  RATIO Final   VLDL 04/17/2021 14  0 - 40 mg/dL Final   LDL Cholesterol 04/17/2021 98  0 - 99 mg/dL Final   Comment:        Total Cholesterol/HDL:CHD Risk Coronary Heart Disease Risk Table                     Men   Women  1/2 Average Risk   3.4   3.3  Average Risk       5.0   4.4  2 X Average Risk   9.6   7.1  3 X Average Risk  23.4   11.0         Use the calculated Patient Ratio above and the CHD Risk Table to determine the patient's CHD Risk.        ATP III CLASSIFICATION (LDL):  <100     mg/dL   Optimal  100-129  mg/dL   Near or Above                    Optimal  130-159  mg/dL   Borderline  160-189  mg/dL   High  >190     mg/dL   Very High Performed at [facility] [address], [place], [place] [address]    TSH 04/17/2021 1.495  0.350 - 4.500 uIU/mL Final   Comment: Performed by a 3rd Generation assay with a functional sensitivity of <=0.01 uIU/mL. Performed at [facility], [place] [address], [place], [place] [address]     Blood Alcohol level:  No results found for: [facility]  Metabolic Disorder Labs: Lab Results  Component Value Date   HGBA1C 5.1 04/17/2021   MPG 99.67 04/17/2021   No results found for: PROLACTIN Lab Results  Component Value Date   CHOL 150 04/17/2021   TRIG 71 04/17/2021   HDL 38 (L) 04/17/2021   CHOLHDL 3.9 04/17/2021   VLDL 14 04/17/2021   LDLCALC 98 04/17/2021    Therapeutic Lab Levels: No results found for: LITHIUM No results found for: VALPROATE No components found for:  CBMZ  Physical Findings   Flowsheet Row ED from 04/16/2021 in [facility] ED from 02/12/2021 in [facility] at [facility]  ED from 02/05/2021 in [place] Risk No Risk No Risk        Musculoskeletal  Strength & Muscle Tone: within normal limits Gait & Station: normal Patient leans: N/A  Psychiatric Specialty Exam  Presentation  General Appearance: Appropriate for Environment  Eye Contact:Fair  Speech:Clear and Coherent  Speech Volume:Normal  Handedness:No data recorded  Mood and Affect  Mood:Dysphoric  Affect:Congruent   Thought Process  Thought Processes:Coherent; Goal Directed  Descriptions of Associations:Intact  Orientation:Full (Time, Place and Person)  Thought  Content:Logical  Diagnosis of Schizophrenia or Schizoaffective disorder in past: No    Hallucinations:Hallucinations: None  Ideas of Reference:None  Suicidal Thoughts:Suicidal Thoughts: No  Homicidal Thoughts:Homicidal Thoughts: No   Sensorium  Memory:Immediate Fair; Recent Fair; Remote Fair  Judgment:Fair  Insight:Fair   Executive Functions  Concentration:Fair  Attention Span:Fair  [place]   Psychomotor Activity  Psychomotor Activity:Psychomotor Activity: Normal   Assets  Assets:Communication Skills; Desire for Improvement; Financial Resources/Insurance; Housing; Intimacy; Physical Health; Leisure Time; Social Support; Resilience; Talents/Skills; Vocational/Educational   Sleep  Sleep:Sleep: Fair Number of Hours of Sleep: 9   No data recorded  Physical Exam  Physical Exam Constitutional:      Appearance: Normal appearance.  HENT:     Head: Normocephalic and atraumatic.     Nose: Nose normal.  Eyes:     Conjunctiva/sclera: Conjunctivae normal.  Cardiovascular:     Rate and Rhythm: Normal rate.  Pulmonary:     Effort: Pulmonary effort is normal.  Musculoskeletal:        General: Normal range of motion.     Cervical back: Normal range of motion.  Neurological:     Mental Status: She is alert and oriented to person, place, and time.   Review of Systems  Constitutional: Negative.   HENT: Negative.    Eyes: Negative.   Respiratory: Negative.    Cardiovascular: Negative.   Gastrointestinal: Negative.   Genitourinary: Negative.   Musculoskeletal: Negative.   Skin: Negative.   Neurological: Negative.   Endo/Heme/Allergies: Negative.   Blood pressure 121/82, pulse 100, temperature (!) 97.5 F (36.4 C), temperature source Tympanic, resp. rate 18, SpO2 100 %. There is no height or weight on file to calculate BMI.  Treatment Plan Summary: MDD anxiety Start fluoxetine 10 mg by mouth daily for  depression. Continue trazodone 50 mg by mouth nightly as needed for sleep Continue Vistaril 25 mg by mouth 3 times a day as needed for anxiety     Dispo: ongoing. SW assisting. Likely home to residence with out patient treatment (patient is open to referral to [facility] as well as individual treatment) once patient is deemed appropriate for discharge.   Marissa Calamity, NP 04/18/2021 8:47 AM

## 2021-04-18 NOTE — Progress Notes (Signed)
Dinner given

## 2021-04-18 NOTE — ED Notes (Signed)
Pt sitting in dining room watching TV. A&O x4, calm and cooperative. Pt denies current SI/HI/AVH. No signs of acute distress noted. Will continue to monitor for safety.

## 2021-04-18 NOTE — ED Notes (Signed)
Pt asleep in bed. Respirations even and unlabored with no signs of acute distress. Will continue to monitor for safety.  

## 2021-04-18 NOTE — Progress Notes (Signed)
Patient resting quietly in bed, respirations are even and unlabored at this time. No objective signs of discomfort. Nursing staff will continue to monitor.

## 2021-04-18 NOTE — Progress Notes (Signed)
Patient alert and oriented X 3, denies SI, HI and AVH during time of RN assessment. Patient received  scheduled medications, as well as one PRN tylenol for headache pain 4/10. Patient pleasant, interacts with peers appropriately as well as staff. Patient has no objective signs of hallucinations or responding to internal stimulus. Nursing staff will continue to monitor.

## 2021-04-18 NOTE — ED Notes (Signed)
Pt asleep in bed. Respirations even and unlabored. Will continue to monitor for safety. ?

## 2021-04-19 DIAGNOSIS — F419 Anxiety disorder, unspecified: Secondary | ICD-10-CM | POA: Diagnosis not present

## 2021-04-19 DIAGNOSIS — Z634 Disappearance and death of family member: Secondary | ICD-10-CM | POA: Diagnosis not present

## 2021-04-19 DIAGNOSIS — Z20822 Contact with and (suspected) exposure to covid-19: Secondary | ICD-10-CM | POA: Diagnosis not present

## 2021-04-19 DIAGNOSIS — F322 Major depressive disorder, single episode, severe without psychotic features: Secondary | ICD-10-CM | POA: Diagnosis not present

## 2021-04-19 MED ORDER — FLUOXETINE HCL 20 MG PO CAPS
20.0000 mg | ORAL_CAPSULE | Freq: Every day | ORAL | Status: DC
  Administered 2021-04-19 – 2021-04-20 (×2): 20 mg via ORAL
  Filled 2021-04-19: qty 7
  Filled 2021-04-19 (×2): qty 1

## 2021-04-19 NOTE — Progress Notes (Signed)
During shift patient continues to deny SI,HI and AVH. Patient did complain of having headaches and a PRN dose of tylenol 650 mg was given for pain 6/10 score. Patient's pain was resolved with the tylenol as evidenced by patient ability to lay down and rest. Patient has been appropriate during shift towards staff and peers. Patient spent time today in the dayroom completing work packets, talking on the telephone and  resting. Patient's appetite has been within normal limits as evidenced by eating all meals today, Breakfast , Lunch and Dinner. RN will report to oncoming shift.

## 2021-04-19 NOTE — Progress Notes (Signed)
Received report from night shift RN. Patient currently resting in bed with eyes closed and even unlabored respirations. No objective signs of discomfort at this time. Nursing staff will continue to monitor.

## 2021-04-19 NOTE — ED Notes (Signed)
Pt asleep in bed. Respirations even and unlabored. Will continue to monitor for safety. ?

## 2021-04-19 NOTE — Progress Notes (Signed)
Patient is alert and oriented X 3, denies SI, HI and AVH. Pain scale 0/10. Patient is pleasant, compliant with medications and attends groups without any issues. Patient denies any concerns at this time. Patient given breakfast and scheduled medication. Nursing staff will continue to monitor.

## 2021-04-19 NOTE — ED Notes (Signed)
Patient denies SI,HI,AVH. Patient is cooperative and interacts well with staff.Respiratory is even and unlabored. No distress noted. Patient stated no complaints at present. will continue to monitor for safety.

## 2021-04-19 NOTE — ED Notes (Signed)
Pt c/o HA 6/10. Tylenol 650mg  given. Pt reports not having a headache prior to starting Prozac yesterday. NP made aware via secure chat. Will continue to monitor for safety.

## 2021-04-19 NOTE — ED Notes (Signed)
Pt is sleeping in the bed at present. Respirations are even and unlabored. No acute distress noted. Will continue to monitor for safety. 

## 2021-04-19 NOTE — ED Provider Notes (Signed)
Behavioral Health Progress Note  Date and Time: 04/19/2021 8:42 AM Name: Julie Rios MRN:  [id number]  Subjective:  Julie Rios is a 20 year old female with no significant past psychiatric history and past medical history significant for dyslexia and dog bite of her right lower extremity on 02/02/2021 (Per patient and chart review, patient is up-to-date on all of her rabies vaccinations and obtain final rabies vaccination 02/19/2021, patient reports that this dog bite has healed appropriately), who presents to the [facility] behavioral health urgent care [facility]) as a voluntary walk-in for worsening depression, anxiety, and SI on 12/8 after becoming overwhelmed earlier in the day at work in the context of recent loss of her father 8 months ago. Loss mother 4 years ago.   Patient seen and reevaluated face-to-face by this provider, and chart reviewed. On evaluation, patient is alert and oriented x4. Her thought process is logical and speech is clear and coherent. She denies SI/HI/AVH.  We discussed increasing the Prozac to 20 mg today. Patient verbalizes understanding and agrees to the stated plan.  She denies experiencing side effects to the Prozac. She denies depressive symptoms today and states that she has not been feeling depressed lately. She denies feeling anxious. She reports fair sleep. She reports a fair appetite. She reports that she plans on staying one more night for treatment and returning home tomorrow. She states that she has been working on her Saturday workbook which is helpful. She reports that her grandmother brought FMLA papers yesterday but was told by staff that the paperwork couldn't be filled out here. She states that the staff told her she  will receive discharge paperwork stating she was here. I informed the patient to ask for a work letter prior to discharge. Patient verbalizes understanding.    Diagnosis:  Final diagnoses:  MDD (major depressive disorder), single episode,  severe , no psychosis ([place])  Anxiety  Bereavement    Total Time spent with patient: 20 minutes  Past Psychiatric History:  no significant past psychiatric history  Past Medical History: No past medical history on file. No past surgical history on file. Family History:  Family History  Problem Relation Age of Onset   Diabetes Mother    Cirrhosis Mother    Family Psychiatric  History:  12 yo sister with depression and anxiety; has a history of a suicide attempt Denies history of completed suicides Denies history of substance abuse  Social History:  Social History   Substance and Sexual Activity  Alcohol Use No     Social History   Substance and Sexual Activity  Drug Use No    Social History   Socioeconomic History   Marital status: Single    Spouse name: Not on file   Number of children: Not on file   Years of education: Not on file   Highest education level: Not on file  Occupational History   Not on file  Tobacco Use   Smoking status: Passive Smoke Exposure - Never Smoker   Smokeless tobacco: Never  Substance and Sexual Activity   Alcohol use: No   Drug use: No   Sexual activity: Not Currently  Other Topics Concern   Not on file  Social History Narrative   Not on file   Social Determinants of Health   Financial Resource Strain: Not on file  Food Insecurity: Not on file  Transportation Needs: Not on file  Physical Activity: Not on file  Stress: Not on file  Social Connections: Not on  file   SDOH:  SDOH Screenings   Alcohol Screen: Not on file  Depression (PHQ2-9): Not on file  Financial Resource Strain: Not on file  Food Insecurity: Not on file  Housing: Not on file  Physical Activity: Not on file  Social Connections: Not on file  Stress: Not on file  Tobacco Use: Medium Risk   Smoking Tobacco Use: Passive Smoke Exposure - Never Smoker   Smokeless Tobacco Use: Never   Passive Exposure: Yes  Transportation Needs: Not on file   Additional  Social History:    Pain Medications: see MAR Prescriptions: see MAR Over the Counter: see MAR History of alcohol / drug use?: No history of alcohol / drug abuse     Current Medications:  Current Facility-Administered Medications  Medication Dose Route Frequency Provider Last Rate Last Admin   acetaminophen (TYLENOL) tablet 650 mg  650 mg Oral Q6H PRN Prescilla Sours, PA-C   650 mg at 04/18/21 1132   alum & mag hydroxide-simeth (MAALOX/MYLANTA) 200-200-20 MG/5ML suspension 30 mL  30 mL Oral Q4H PRN Margorie John W, PA-C       FLUoxetine (PROZAC) capsule 20 mg  20 mg Oral Daily Riyan Haile L, NP       hydrOXYzine (ATARAX) tablet 25 mg  25 mg Oral TID PRN Margorie John W, PA-C       magnesium hydroxide (MILK OF MAGNESIA) suspension 30 mL  30 mL Oral Daily PRN Margorie John W, PA-C       traZODone (DESYREL) tablet 50 mg  50 mg Oral QHS PRN Prescilla Sours, PA-C       Current Outpatient Medications  Medication Sig Dispense Refill   ondansetron (ZOFRAN) 4 MG tablet Take 1-2 tablets (4-8 mg total) by mouth every 6 (six) hours. 20 tablet 0   traMADol-acetaminophen (ULTRACET) 37.5-325 MG tablet Take 1-2 tablets by mouth every 6 (six) hours as needed (headache). 20 tablet 0    Labs  Lab Results:  Admission on 04/16/2021  Component Date Value Ref Range Status   SARS Coronavirus 2 by RT PCR 04/16/2021 NEGATIVE  NEGATIVE Final   Comment: (NOTE) SARS-CoV-2 target nucleic acids are NOT DETECTED.  The SARS-CoV-2 RNA is generally detectable in upper respiratory specimens during the acute phase of infection. The lowest concentration of SARS-CoV-2 viral copies this assay can detect is 138 copies/mL. A negative result does not preclude SARS-Cov-2 infection and should not be used as the sole basis for treatment or other patient management decisions. A negative result may occur with  improper specimen collection/handling, submission of specimen other than nasopharyngeal swab, presence of viral  mutation(s) within the areas targeted by this assay, and inadequate number of viral copies(<138 copies/mL). A negative result must be combined with clinical observations, patient history, and epidemiological information. The expected result is Negative.  Fact Sheet for Patients:  EntrepreneurPulse.com.au  Fact Sheet for Healthcare Providers:  IncredibleEmployment.be  This test is no                          t yet approved or cleared by the [place] FDA and  has been authorized for detection and/or diagnosis of SARS-CoV-2 by FDA under an Emergency Use Authorization (EUA). This EUA will remain  in effect (meaning this test can be used) for the duration of the COVID-19 declaration under Section 564(b)(1) of the Act, 21 U.S.C.section 360bbb-3(b)(1), unless the authorization is terminated  or revoked sooner.  Influenza A by PCR 04/16/2021 NEGATIVE  NEGATIVE Final   Influenza B by PCR 04/16/2021 NEGATIVE  NEGATIVE Final   Comment: (NOTE) The Xpert Xpress SARS-CoV-2/FLU/RSV plus assay is intended as an aid in the diagnosis of influenza from Nasopharyngeal swab specimens and should not be used as a sole basis for treatment. Nasal washings and aspirates are unacceptable for Xpert Xpress SARS-CoV-2/FLU/RSV testing.  Fact Sheet for Patients: EntrepreneurPulse.com.au  Fact Sheet for Healthcare Providers: IncredibleEmployment.be  This test is not yet approved or cleared by the [place] FDA and has been authorized for detection and/or diagnosis of SARS-CoV-2 by FDA under an Emergency Use Authorization (EUA). This EUA will remain in effect (meaning this test can be used) for the duration of the COVID-19 declaration under Section 564(b)(1) of the Act, 21 U.S.C. section 360bbb-3(b)(1), unless the authorization is terminated or revoked.  Performed at [facility], [place] [address], [place],  [place] [address]    SARS Coronavirus 2 Ag 04/16/2021 Negative  Negative Preliminary   POC Amphetamine UR 04/16/2021 None Detected  NONE DETECTED (Cut Off Level 1000 ng/mL) Preliminary   POC Secobarbital (BAR) 04/16/2021 None Detected  NONE DETECTED (Cut Off Level 300 ng/mL) Preliminary   POC Buprenorphine (BUP) 04/16/2021 None Detected  NONE DETECTED (Cut Off Level 10 ng/mL) Preliminary   POC Oxazepam (BZO) 04/16/2021 None Detected  NONE DETECTED (Cut Off Level 300 ng/mL) Preliminary   POC Cocaine UR 04/16/2021 None Detected  NONE DETECTED (Cut Off Level 300 ng/mL) Preliminary   POC Methamphetamine UR 04/16/2021 None Detected  NONE DETECTED (Cut Off Level 1000 ng/mL) Preliminary   POC Morphine 04/16/2021 None Detected  NONE DETECTED (Cut Off Level 300 ng/mL) Preliminary   POC Oxycodone UR 04/16/2021 None Detected  NONE DETECTED (Cut Off Level 100 ng/mL) Preliminary   POC Methadone UR 04/16/2021 None Detected  NONE DETECTED (Cut Off Level 300 ng/mL) Preliminary   POC Marijuana UR 04/16/2021 None Detected  NONE DETECTED (Cut Off Level 50 ng/mL) Preliminary   SARSCOV2ONAVIRUS 2 AG 04/16/2021 NEGATIVE  NEGATIVE Final   Comment: (NOTE) SARS-CoV-2 antigen NOT DETECTED.   Negative results are presumptive.  Negative results do not preclude SARS-CoV-2 infection and should not be used as the sole basis for treatment or other patient management decisions, including infection  control decisions, particularly in the presence of clinical signs and  symptoms consistent with COVID-19, or in those who have been in contact with the virus.  Negative results must be combined with clinical observations, patient history, and epidemiological information. The expected result is Negative.  Fact Sheet for Patients: HandmadeRecipes.com.cy  Fact Sheet for Healthcare Providers: FuneralLife.at  This test is not yet approved or cleared by the [place] FDA and  has been  authorized for detection and/or diagnosis of SARS-CoV-2 by FDA under an Emergency Use Authorization (EUA).  This EUA will remain in effect (meaning this test can be used) for the duration of  the COV                          ID-19 declaration under Section 564(b)(1) of the Act, 21 U.S.C. section 360bbb-3(b)(1), unless the authorization is terminated or revoked sooner.     Preg Test, Ur 04/16/2021 NEGATIVE  NEGATIVE Final   Comment:        THE SENSITIVITY OF THIS METHODOLOGY IS >24 mIU/mL    WBC 04/17/2021 7.6  4.0 - 10.5 K/uL Final   Comment: REPEATED TO VERIFY Alianna Wurster  COUNT CONFIRMED ON SMEAR    RBC 04/17/2021 5.01  3.87 - 5.11 MIL/uL Final   Hemoglobin 04/17/2021 14.0  12.0 - 15.0 g/dL Final   HCT 04/17/2021 43.4  36.0 - 46.0 % Final   MCV 04/17/2021 86.6  80.0 - 100.0 fL Final   MCH 04/17/2021 27.9  26.0 - 34.0 pg Final   MCHC 04/17/2021 32.3  30.0 - 36.0 g/dL Final   RDW 04/17/2021 13.6  11.5 - 15.5 % Final   Platelets 04/17/2021 PLATELET CLUMPS NOTED ON SMEAR, UNABLE TO ESTIMATE  150 - 400 K/uL Final   PLATELET CLUMPS NOTED ON SMEAR, UNABLE TO ESTIMATE   nRBC 04/17/2021 0.0  0.0 - 0.2 % Final   Neutrophils Relative % 04/17/2021 51  % Final   Neutro Abs 04/17/2021 3.9  1.7 - 7.7 K/uL Final   Lymphocytes Relative 04/17/2021 30  % Final   Lymphs Abs 04/17/2021 2.3  0.7 - 4.0 K/uL Final   Monocytes Relative 04/17/2021 6  % Final   Monocytes Absolute 04/17/2021 0.5  0.1 - 1.0 K/uL Final   Eosinophils Relative 04/17/2021 11  % Final   Eosinophils Absolute 04/17/2021 0.8 (H)  0.0 - 0.5 K/uL Final   Basophils Relative 04/17/2021 1  % Final   Basophils Absolute 04/17/2021 0.1  0.0 - 0.1 K/uL Final   Immature Granulocytes 04/17/2021 1  % Final   Abs Immature Granulocytes 04/17/2021 0.04  0.00 - 0.07 K/uL Final   Performed at [facility], [place] [address], [place], [place] [address]   Sodium 04/17/2021 139  135 - 145 mmol/L Final   Potassium 04/17/2021 4.4  3.5 - 5.1 mmol/L  Final   Chloride 04/17/2021 108  98 - 111 mmol/L Final   CO2 04/17/2021 23  22 - 32 mmol/L Final   Glucose, Bld 04/17/2021 85  70 - 99 mg/dL Final   Glucose reference range applies only to samples taken after fasting for at least 8 hours.   BUN 04/17/2021 5 (L)  6 - 20 mg/dL Final   Creatinine, Ser 04/17/2021 0.64  0.44 - 1.00 mg/dL Final   Calcium 04/17/2021 9.3  8.9 - 10.3 mg/dL Final   Total Protein 04/17/2021 6.9  6.5 - 8.1 g/dL Final   Albumin 04/17/2021 4.0  3.5 - 5.0 g/dL Final   AST 04/17/2021 18  15 - 41 U/L Final   ALT 04/17/2021 19  0 - 44 U/L Final   Alkaline Phosphatase 04/17/2021 57  38 - 126 U/L Final   Total Bilirubin 04/17/2021 0.4  0.3 - 1.2 mg/dL Final   GFR, Estimated 04/17/2021 >60  >60 mL/min Final   Comment: (NOTE) Calculated using the CKD-EPI Creatinine Equation (2021)    Anion gap 04/17/2021 8  5 - 15 Final   Performed at [facility] [address], [place], [place] [address]   Hgb A1c MFr Bld 04/17/2021 5.1  4.8 - 5.6 % Final   Comment: (NOTE) Pre diabetes:          5.7%-6.4%  Diabetes:              >6.4%  Glycemic control for   <7.0% adults with diabetes    Mean Plasma Glucose 04/17/2021 99.67  mg/dL Final   Performed at [facility], [place] [address] [place], [place] [address]   Cholesterol 04/17/2021 150  0 - 200 mg/dL Final   Triglycerides 04/17/2021 71  <150 mg/dL Final   HDL 04/17/2021 38 (L)  >  40 mg/dL Final   Total CHOL/HDL Ratio 04/17/2021 3.9  RATIO Final   VLDL 04/17/2021 14  0 - 40 mg/dL Final   LDL Cholesterol 04/17/2021 98  0 - 99 mg/dL Final   Comment:        Total Cholesterol/HDL:CHD Risk Coronary Heart Disease Risk Table                     Men   Women  1/2 Average Risk   3.4   3.3  Average Risk       5.0   4.4  2 X Average Risk   9.6   7.1  3 X Average Risk  23.4   11.0        Use the calculated Patient Ratio above and the CHD Risk Table to determine the patient's CHD Risk.        ATP III CLASSIFICATION  (LDL):  <100     mg/dL   Optimal  921-194  mg/dL   Near or Above                    Optimal  130-159  mg/dL   Borderline  174-081  mg/dL   High  >448     mg/dL   Very High Performed at [facility], [address], [place], [place] [address]    TSH 04/17/2021 1.495  0.350 - 4.500 uIU/mL Final   Comment: Performed by a 3rd Generation assay with a functional sensitivity of <=0.01 uIU/mL. Performed at [facility], [address] [place], [place] [address]     Blood Alcohol level:  No results found for: [facility]  Metabolic Disorder Labs: Lab Results  Component Value Date   HGBA1C 5.1 04/17/2021   MPG 99.67 04/17/2021   No results found for: PROLACTIN Lab Results  Component Value Date   CHOL 150 04/17/2021   TRIG 71 04/17/2021   HDL 38 (L) 04/17/2021   CHOLHDL 3.9 04/17/2021   VLDL 14 04/17/2021   LDLCALC 98 04/17/2021    Therapeutic Lab Levels: No results found for: LITHIUM No results found for: VALPROATE No components found for:  CBMZ  Physical Findings   Flowsheet Row ED from 04/16/2021 in [facility] ED from 02/12/2021 in [facility] at [facility]  ED from 02/05/2021 in [facility]  C-SSRS RISK CATEGORY High Risk No Risk No Risk        Musculoskeletal  Strength & Muscle Tone: within normal limits Gait & Station: normal Patient leans: N/A  Psychiatric Specialty Exam  Presentation  General Appearance: Appropriate for Environment  Eye Contact:Fair  Speech:Clear and Coherent  Speech Volume:Normal  Handedness:No data recorded  Mood and Affect  Mood:Euthymic  Affect:Congruent   Thought Process  Thought Processes:Coherent; Goal Directed  Descriptions of Associations:Intact  Orientation:Full (Time, Place and Person)  Thought Content:Logical  Diagnosis of Schizophrenia or Schizoaffective disorder in past: No    Hallucinations:Hallucinations: None  Ideas of  Reference:None  Suicidal Thoughts:Suicidal Thoughts: No  Homicidal Thoughts:Homicidal Thoughts: No   Sensorium  Memory:Immediate Fair; Recent Fair; Remote Fair  Judgment:Fair  Insight:Fair   Executive Functions  Concentration:Fair  Attention Span:Fair  Recall:Fair  Fund of Knowledge:Fair  Language:Fair   Psychomotor Activity  Psychomotor Activity:Psychomotor Activity: Normal   Assets  Assets:Communication Skills; Desire for Improvement; Financial Resources/Insurance; Housing; Intimacy; Leisure Time; Physical Health; Resilience; Social Support; Talents/Skills   Sleep  Sleep:Sleep: Fair Number of  Hours of Sleep: 8   No data recorded  Physical Exam  Physical Exam Constitutional:      Appearance: Normal appearance.  HENT:     Head: Normocephalic and atraumatic.     Nose: Nose normal.  Eyes:     Conjunctiva/sclera: Conjunctivae normal.  Cardiovascular:     Rate and Rhythm: Normal rate.  Pulmonary:     Effort: Pulmonary effort is normal.  Musculoskeletal:        General: Normal range of motion.     Cervical back: Normal range of motion.  Neurological:     Mental Status: She is alert and oriented to person, place, and time.   Review of Systems  Constitutional: Negative.   HENT: Negative.    Eyes: Negative.   Respiratory: Negative.    Cardiovascular: Negative.   Skin: Negative.   Neurological: Negative.   Endo/Heme/Allergies: Negative.   Blood pressure 126/81, pulse 91, temperature 98.4 F (36.9 C), temperature source Oral, resp. rate 16, SpO2 99 %. There is no height or weight on file to calculate BMI.  Treatment Plan Summary: Patient admitted to the [facility] facility based crisis for mood stabilization and safety.  Medications:  Increased fluoxetine to  20 mg by mouth daily for depression. Continue trazodone 50 mg by mouth nightly as needed for sleep Continue Vistaril 25 mg by mouth 3 times a day as needed for anxiety  Marissa Calamity,  NP 04/19/2021 8:42 AM

## 2021-04-19 NOTE — ED Notes (Signed)
Pt is in the bed sleeping. Respirations are even and unlabored. No acute distress noted. Will continue to monitor for safety. 

## 2021-04-20 DIAGNOSIS — F322 Major depressive disorder, single episode, severe without psychotic features: Secondary | ICD-10-CM | POA: Diagnosis not present

## 2021-04-20 DIAGNOSIS — F419 Anxiety disorder, unspecified: Secondary | ICD-10-CM | POA: Diagnosis not present

## 2021-04-20 DIAGNOSIS — Z634 Disappearance and death of family member: Secondary | ICD-10-CM | POA: Diagnosis not present

## 2021-04-20 DIAGNOSIS — Z20822 Contact with and (suspected) exposure to covid-19: Secondary | ICD-10-CM | POA: Diagnosis not present

## 2021-04-20 MED ORDER — FLUOXETINE HCL 20 MG PO CAPS: 20.0000 mg | ORAL_CAPSULE | Freq: Every day | ORAL | 0 refills | Status: DC

## 2021-04-20 NOTE — Progress Notes (Signed)
Received Julie Rios this AM asleep in her bed, she woke up on her own, received breakfast and returned to her room. Later she attended the AA group therapy session. She verbalized her desire to be discharge today. She denied all of the psychiatric symptoms and feels safe to go home.

## 2021-04-20 NOTE — Clinical Social Work Psych Note (Signed)
CSW Discharge Note   Harris reported feeling "great" this morning. She denied having any worsening depressive or anxiety symptoms at this time. She also shared that she has been able to "better" process and deal with her grief regarding her father's and mother's passing's.  Waldine denied having any SI, HI or AVH at this time.   Adanna shared that she will be celebrating her 20th birthday tomorrow, and "feels good" about discharging today. She reports her current medication regiment seems to be "working", however she has experienced some headaches.   Odetta states usually a Tylenol or ibuprofen helps alleviate the pain. She denied having any other questions or concerns regarding her medications.   CSW has charted outpatient follow up resources in Krystyna's AVS.   Carilyn reports she plans to return home with her grandmother at discharge. She reports her boyfriend's mother will pick her up at discharge.    CSW will continue to follow until discharge.    Baldo Daub, MSW, LCSW Clinical Child psychotherapist (Facility Based Crisis) [facility]

## 2021-04-20 NOTE — ED Notes (Signed)
Pt is currently sleeping, no distress noted, environmental check complete, will continue to monitor patient for safety. ? ?

## 2021-04-20 NOTE — Progress Notes (Signed)
Julie Rios received her AVS and letter, questions answered and she retrieved her personal belongings. She was escorted to the lobby where her ride was waiting.

## 2021-04-20 NOTE — ED Notes (Signed)
Pt is sleeping in the bed at present. Respirations are even and unlabored. No acute distress noted. Will continue to monitor for safety. 

## 2021-04-20 NOTE — Discharge Instructions (Addendum)
Take all medications as prescribed by his/her mental healthcare provider. °Report any adverse effects and or reactions from the medicines to your outpatient provider promptly. °Do not engage in alcohol and or illegal drug use while on prescription medicines. °In the event of worsening symptoms, call the crisis hotline, 911 and or go to the nearest ED for appropriate evaluation and treatment of symptoms. °follow-up with your primary care provider for your other medical issues, concerns and or health care needs. ° ° ° ° °Please come to [facility] (this facility) during walk in hours for appointment with psychiatrist for further medication management and for therapists for therapy.  ° ° Walk in hours are 8-11 AM Monday through Thursday for medication management. Therapy walk in hours are Monday-Wednesday 8 AM-1PM.   It is first come, first -serve; it is best to arrive by 7:00 AM.  ° °On Friday from 1 pm to 4 pm for therapy intake only. Please arrive by 12:00 pm as it is  first come, first -serve.   ° °When you arrive please go upstairs for your appointment. If you are unsure of where to go, inform the front desk that you are here for a walk in appointment and they will assist you with directions upstairs. ° °Address:  °[address], in [place], [address] °Ph: [phone]  ° °
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
## 2021-04-22 ENCOUNTER — Other Ambulatory Visit: Payer: Self-pay

## 2021-04-22 ENCOUNTER — Telehealth (HOSPITAL_COMMUNITY): Payer: Self-pay | Admitting: Professional

## 2021-04-22 ENCOUNTER — Ambulatory Visit (INDEPENDENT_AMBULATORY_CARE_PROVIDER_SITE_OTHER): Payer: Medicaid Other | Admitting: Physician Assistant

## 2021-04-22 VITALS — BP 152/99 | HR 116 | Ht 64.0 in | Wt 196.0 lb

## 2021-04-22 DIAGNOSIS — F331 Major depressive disorder, recurrent, moderate: Secondary | ICD-10-CM | POA: Diagnosis not present

## 2021-04-22 DIAGNOSIS — F411 Generalized anxiety disorder: Secondary | ICD-10-CM | POA: Diagnosis not present

## 2021-04-22 MED ORDER — FLUOXETINE HCL 20 MG PO CAPS: 20.0000 mg | ORAL_CAPSULE | Freq: Every day | ORAL | 1 refills | Status: DC
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
## 2021-04-23 ENCOUNTER — Encounter (HOSPITAL_COMMUNITY): Payer: Self-pay | Admitting: Physician Assistant

## 2021-04-24 ENCOUNTER — Telehealth (HOSPITAL_COMMUNITY): Payer: Self-pay | Admitting: Physician Assistant

## 2021-04-24 ENCOUNTER — Encounter (HOSPITAL_COMMUNITY): Payer: Self-pay | Admitting: Physician Assistant

## 2021-04-24 NOTE — Telephone Encounter (Signed)
Micsha May, mother-in-law, called requesting to come pick up the letter from provider stating patient is to remain out of work until the first of the year 2023. Letter needs to be turned in at work Monday, 04/27/21.  Call Mischa [phone] Patient resides with her.

## 2021-04-24 NOTE — Telephone Encounter (Signed)
Called patient to confirm letter from provider is available in her chart. Copy for pt to pick up at front office file.

## 2021-04-25 ENCOUNTER — Telehealth (HOSPITAL_COMMUNITY): Payer: Self-pay

## 2021-04-25 NOTE — BH Assessment (Signed)
Care Management - [facility] Follow Up Discharge  Writer attempted to make contact with patient today and was unsuccessful.  Writer left a HIPPA compliant voice message.   Per chart review, patient was provided with outpatient resources.

## 2021-04-28 NOTE — Telephone Encounter (Signed)
Provider was contacted by Lauralee Evener, regarding patient's work note.  Note has been completed and is available in her chart.

## 2021-04-29 ENCOUNTER — Telehealth (HOSPITAL_COMMUNITY): Payer: Self-pay | Admitting: Professional

## 2021-05-12 ENCOUNTER — Telehealth (HOSPITAL_COMMUNITY): Payer: Self-pay | Admitting: *Deleted

## 2021-05-12 ENCOUNTER — Telehealth (HOSPITAL_COMMUNITY): Payer: Self-pay | Admitting: Physician Assistant

## 2021-05-12 NOTE — Telephone Encounter (Signed)
Received FMLA forms via email from patient for pt employer [employer].  Placed forms in medical assistant bin for provider.

## 2021-05-12 NOTE — Telephone Encounter (Signed)
Call from a family member that did not identify herself that despite the letter from [employer] [place] [employer] for her job, the job is now insisting the provider fill out FMLA paperwork and they want it by tomorrow. Told the caller I would make Eddie aware of the request, she is to email the form to our generic email address now, but its unreasonable for her employer to expect the paperwork to be returned by tomorrow.

## 2021-06-05 ENCOUNTER — Telehealth (INDEPENDENT_AMBULATORY_CARE_PROVIDER_SITE_OTHER): Payer: Medicaid Other | Admitting: Family

## 2021-06-05 DIAGNOSIS — F331 Major depressive disorder, recurrent, moderate: Secondary | ICD-10-CM | POA: Diagnosis not present

## 2021-06-05 DIAGNOSIS — F411 Generalized anxiety disorder: Secondary | ICD-10-CM | POA: Diagnosis not present

## 2021-06-05 MED ORDER — FLUOXETINE HCL 20 MG PO CAPS: 20.0000 mg | ORAL_CAPSULE | Freq: Every day | ORAL | 0 refills | Status: DC

## 2021-06-05 NOTE — Progress Notes (Signed)
Virtual Visit via Telephone Note  I connected with Julie Rios on 06/09/21 at 10:30 AM EST by telephone and verified that I am speaking with the correct person using two identifiers.  Location: Patient: Home Provider: Office   I discussed the limitations, risks, security and privacy concerns of performing an evaluation and management service by telephone and the availability of in person appointments. I also discussed with the patient that there may be a patient responsible charge related to this service. The patient expressed understanding and agreed to proceed.    I discussed the assessment and treatment plan with the patient. The patient was provided an opportunity to ask questions and all were answered. The patient agreed with the plan and demonstrated an understanding of the instructions.   The patient was advised to call back or seek an in-person evaluation if the symptoms worsen or if the condition fails to improve as anticipated.  I provided 15 minutes of non-face-to-face time during this encounter.   Oneta Rack, NP  [facility] MD/PA/NP OP Progress Note  06/09/2021 2:28 PM Julie Rios  MRN:  [id number]  Chief Complaint: Julie Rios was evaluated telephonically, charted history of major depressive disorder.  States she has been taking Prozac as directed.  States she is tolerating medications well.  Reports her mood has improved overall.  Rating her depression 4 out of 10 with 10 being the worst.  Denying suicidal or homicidal ideations.  Denies auditory or visual hallucinations.  Does report concerns with dry mouth unsure if this is related to Prozac and/or melatonin.  Discussed continuing to monitor symptoms.  Will consider medication adjustment at follow-up appointment if symptoms persist.  Encouraged to utilize OTC biotin to help with reported symptoms.  Multiple family members are attending this assessment.   Patient approved to continue discussion with mother-in-law and significant  other.  They denied any safety concerns.  Reported notable improvement with her mood and depression.  Patient to continue medications as indicated.  Discussed following up with partial hospitalization programming for additional outpatient resources.  She was receptive to plan.  Referral was sent over to [facility].  Support encouragement reassurance was provided.    Visit Diagnosis:    ICD-10-CM   1. Moderate episode of recurrent major depressive disorder (HCC)  F33.1 FLUoxetine (PROZAC) 20 MG capsule    2. Generalized anxiety disorder  F41.1 FLUoxetine (PROZAC) 20 MG capsule      Past Psychiatric History:   Past Medical History: No past medical history on file. No past surgical history on file.  Family Psychiatric History:   Family History:  Family History  Problem Relation Age of Onset   Diabetes Mother    Cirrhosis Mother     Social History:  Social History   Socioeconomic History   Marital status: Single    Spouse name: Not on file   Number of children: Not on file   Years of education: Not on file   Highest education level: Not on file  Occupational History   Not on file  Tobacco Use   Smoking status: Passive Smoke Exposure - Never Smoker   Smokeless tobacco: Never  Substance and Sexual Activity   Alcohol use: No   Drug use: No   Sexual activity: Not Currently  Other Topics Concern   Not on file  Social History Narrative   Not on file   Social Determinants of Health   Financial Resource Strain: Not on file  Food Insecurity: Not on file  Transportation Needs:  Not on file  Physical Activity: Not on file  Stress: Not on file  Social Connections: Not on file    Allergies: No Known Allergies  Metabolic Disorder Labs: Lab Results  Component Value Date   HGBA1C 5.1 04/17/2021   MPG 99.67 04/17/2021   No results found for: PROLACTIN Lab Results  Component Value Date   CHOL 150 04/17/2021   TRIG 71 04/17/2021   HDL 38 (L) 04/17/2021   CHOLHDL 3.9  04/17/2021   VLDL 14 04/17/2021   LDLCALC 98 04/17/2021   Lab Results  Component Value Date   TSH 1.495 04/17/2021    Therapeutic Level Labs: No results found for: LITHIUM No results found for: VALPROATE No components found for:  CBMZ  Current Medications: Current Outpatient Medications  Medication Sig Dispense Refill   FLUoxetine (PROZAC) 20 MG capsule Take 1 capsule (20 mg total) by mouth daily. 60 capsule 0   No current facility-administered medications for this visit.     Musculoskeletal:   Psychiatric Specialty Exam: Review of Systems  There were no vitals taken for this visit.There is no height or weight on file to calculate BMI.  General Appearance: NA  Eye Contact:  NA  Speech:  Clear and Coherent  Volume:  Normal  Mood:  Anxious and Depressed  Affect:  Appropriate  Thought Process:  Coherent  Orientation:  Full (Time, Place, and Person)  Thought Content: Logical   Suicidal Thoughts:  No  Homicidal Thoughts:  No  Memory:  Immediate;   Good Recent;   Good  Judgement:  Good  Insight:  Good  Psychomotor Activity:  Normal  Concentration:  Concentration: Good  Recall:  Good  Fund of Knowledge: Good  Language: Good  Akathisia:  No  Handed:  Right  AIMS (if indicated): done  Assets:  Communication Skills Desire for Improvement Resilience Social Support  ADL's:  Intact  Cognition: WNL  Sleep:  Good   Screenings: GAD-7    Flowsheet Row Office Visit from 04/22/2021 in [facility]  Total GAD-7 Score 18      PHQ2-9    Flowsheet Row Office Visit from 04/22/2021 in [facility]  PHQ-2 Total Score 4  PHQ-9 Total Score 19      Flowsheet Row Office Visit from 04/22/2021 in [facility] from 04/16/2021 in [id number] ED from 02[facility]12/2021 in [facility]   C-SSRS RISK CATEGORY Moderate Risk High Risk No Risk         Assessment and Plan: Julie Rios 21 year old female presents for medication management.  Reports her mood has been stable with medication.  Family members was agreeable during this assessment.  She is a totally different person when she is not on her medications.  She continues to express marked improvement.  No suicidal or homicidal ideations reported denies auditory or visual hallucinations.  Patient to keep follow-up appointment 4 to 6 weeks for medication management.  Consider adding partial hospitalization programming for additional coping skills.  Patient was receptive to plan.  Major depressive disorder: Generalized anxiety disorder:  Continue Prozac 20 mg p.o. daily  Patient to follow-up 4 to 6 weeks for medication management  Oneta Rack, NP 06/09/2021, 2:28 PM

## 2021-06-09 ENCOUNTER — Encounter (HOSPITAL_COMMUNITY): Payer: Self-pay | Admitting: Family

## 2021-06-18 ENCOUNTER — Ambulatory Visit (INDEPENDENT_AMBULATORY_CARE_PROVIDER_SITE_OTHER): Payer: Medicaid Other | Admitting: Clinical

## 2021-06-18 ENCOUNTER — Other Ambulatory Visit: Payer: Self-pay

## 2021-06-18 DIAGNOSIS — F331 Major depressive disorder, recurrent, moderate: Secondary | ICD-10-CM | POA: Diagnosis not present
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
## 2021-06-18 NOTE — Plan of Care (Signed)
Client was in agreement with the treatment plan. °

## 2021-07-23 ENCOUNTER — Telehealth (INDEPENDENT_AMBULATORY_CARE_PROVIDER_SITE_OTHER): Payer: Medicaid Other | Admitting: Physician Assistant

## 2021-07-23 ENCOUNTER — Encounter (HOSPITAL_COMMUNITY): Payer: Self-pay | Admitting: Physician Assistant

## 2021-07-23 DIAGNOSIS — F411 Generalized anxiety disorder: Secondary | ICD-10-CM

## 2021-07-23 DIAGNOSIS — F331 Major depressive disorder, recurrent, moderate: Secondary | ICD-10-CM | POA: Diagnosis not present

## 2021-07-23 MED ORDER — FLUOXETINE HCL 40 MG PO CAPS: 40.0000 mg | ORAL_CAPSULE | Freq: Every day | ORAL | 2 refills | Status: DC

## 2021-07-23 MED ORDER — BUPROPION HCL ER (XL) 150 MG PO TB24
150.0000 mg | ORAL_TABLET | ORAL | 1 refills | Status: DC
Start: 2021-07-23 — End: 2021-09-23

## 2021-07-23 NOTE — Progress Notes (Addendum)
BH MD/PA/NP OP Progress Note ? ?Virtual Visit via Video Note ? ?I connected with Julie Rios on 07/23/21 at  2:30 PM EDT by a video enabled telemedicine application and verified that I am speaking with the correct person using two identifiers. ? ?Location: ?Patient: Home ?Provider: Clinic ?  ?I discussed the limitations of evaluation and management by telemedicine and the availability of in person appointments. The patient expressed understanding and agreed to proceed. ? ?Follow Up Instructions: ? ?I discussed the assessment and treatment plan with the patient. The patient was provided an opportunity to ask questions and all were answered. The patient agreed with the plan and demonstrated an understanding of the instructions. ?  ?The patient was advised to call back or seek an in-person evaluation if the symptoms worsen or if the condition fails to improve as anticipated. ? ?I provided 21 minutes of non-face-to-face time during this encounter. ? ?Malachy Mood, PA ? ? ?07/23/2021 3:06 PM ?[facility]  ?MRN:  [id number] ? ?Chief Complaint:  ?Chief Complaint  ?Patient presents with  ? Follow-up  ? Medication Management  ? ?HPI:  ? ?Julie Rios is a 21 year old female with a past psychiatric history significant for major depressive disorder and generalized anxiety disorder who presents to [facility] via virtual video visit for follow-up and medication management.  Patient is currently being managed on the following medication: Prozac 20 mg daily. ? ?Patient reports that her use of Prozac has been going well.  She does express that she has been experiencing decreased libido and went off the medication for a brief period of time.  She reports that while she was off her medication, she started experiencing hallucinations characterized by hearing screaming and hearing her father talk to her.  Patient notes that she also experienced when is seeing shadows at the corner of her eyes  when off her medication.  Patient reports that she has started taking her medications again and notices that if she does not take her medication before noon, she has difficulty falling asleep. ? ?Patient reports that her depression has not been as big of an issue and states that she experiences depressive episodes roughly twice a week.  She notes that her mood is often influenced by the weather stating that during rainy days, her mood tends to be low.  Patient endorses decreased energy but denies feelings of sadness, lack of motivation, and feelings of guilt/worthlessness.  Patient endorses anxiety and rates her anxiety as 6 out of 10.  A PHQ-9 screen was performed with the patient scoring a 14.  A GAD-7 screen was also performed with the patient scoring a 16. ? ?Patient is alert and oriented x4, calm, cooperative, and fully engaged in conversation during the encounter.  Patient endorses happy mood.  Patient denies suicidal or homicidal ideations.  She further denies auditory or visual hallucinations and does not appear to be responding to internal/external stimuli.  Patient endorses good sleep when she is able to take her medication before noon.  Patient reports that she receives roughly 8 hours of sleep each night but if she takes her medication afternoon, she experiences 4 to 5 hours of sleep.  Patient endorses episodes of binge eating and food craving but states that she normally eats roughly 3 meals per day.  Patient denies alcohol consumption, tobacco use, and illicit drug use. ? ? ?Visit Diagnosis:  ?  ICD-10-CM   ?1. Moderate episode of recurrent major depressive disorder (HCC)  F33.1  FLUoxetine (PROZAC) 40 MG capsule  ?  buPROPion (WELLBUTRIN XL) 150 MG 24 hr tablet  ?  ?2. Generalized anxiety disorder  F41.1 FLUoxetine (PROZAC) 40 MG capsule  ?  ? ? ?Past Psychiatric History:  ?Major depressive disorder ?Generalized anxiety disorder ? ?Past Medical History: History reviewed. No pertinent past medical  history. History reviewed. No pertinent surgical history. ? ?Family Psychiatric History:  ?Sister - Depression, borderline personality ? ?Family History:  ?Family History  ?Problem Relation Age of Onset  ? Diabetes Mother   ? Cirrhosis Mother   ? ? ?Social History:  ?Social History  ? ?Socioeconomic History  ? Marital status: Single  ?  Spouse name: Not on file  ? Number of children: Not on file  ? Years of education: Not on file  ? Highest education level: Not on file  ?Occupational History  ? Not on file  ?Tobacco Use  ? Smoking status: Passive Smoke Exposure - Never Smoker  ? Smokeless tobacco: Never  ?Substance and Sexual Activity  ? Alcohol use: No  ? Drug use: No  ? Sexual activity: Not Currently  ?Other Topics Concern  ? Not on file  ?Social History Narrative  ? Not on file  ? ?Social Determinants of Health  ? ?Financial Resource Strain: Not on file  ?Food Insecurity: Not on file  ?Transportation Needs: Not on file  ?Physical Activity: Not on file  ?Stress: Not on file  ?Social Connections: Not on file  ? ? ?Allergies: No Known Allergies ? ?Metabolic Disorder Labs: ?Lab Results  ?Component Value Date  ? HGBA1C 5.1 04/17/2021  ? MPG 99.67 04/17/2021  ? ?No results found for: PROLACTIN ?Lab Results  ?Component Value Date  ? CHOL 150 04/17/2021  ? TRIG 71 04/17/2021  ? HDL 38 (L) 04/17/2021  ? CHOLHDL 3.9 04/17/2021  ? VLDL 14 04/17/2021  ? [place] 98 04/17/2021  ? ?Lab Results  ?Component Value Date  ? TSH 1.495 04/17/2021  ? ? ?Therapeutic Level Labs: ?No results found for: LITHIUM ?No results found for: VALPROATE ?No components found for:  CBMZ ? ?Current Medications: ?Current Outpatient Medications  ?Medication Sig Dispense Refill  ? buPROPion (WELLBUTRIN XL) 150 MG 24 hr tablet Take 1 tablet (150 mg total) by mouth every morning. 30 tablet 1  ? FLUoxetine (PROZAC) 40 MG capsule Take 1 capsule (40 mg total) by mouth daily. 30 capsule 2  ? ?No current facility-administered medications for this visit.   ? ? ? ?Musculoskeletal: ?Strength & Muscle Tone: within normal limits ?Gait & Station: normal ?Patient leans: N/A ? ?Psychiatric Specialty Exam: ?Review of Systems  ?Psychiatric/Behavioral:  Positive for sleep disturbance. Negative for decreased concentration, dysphoric mood, hallucinations, self-injury and suicidal ideas. The patient is nervous/anxious. The patient is not hyperactive.    ?There were no vitals taken for this visit.There is no height or weight on file to calculate BMI.  ?General Appearance: Casual  ?Eye Contact:  Good  ?Speech:  Clear and Coherent and Normal Rate  ?Volume:  Normal  ?Mood:  Anxious and Depressed  ?Affect:  Congruent  ?Thought Process:  Coherent, Goal Directed, and Descriptions of Associations: Intact  ?Orientation:  Full (Time, Place, and Person)  ?Thought Content: WDL   ?Suicidal Thoughts:  No  ?Homicidal Thoughts:  No  ?Memory:  Immediate;   Good ?Recent;   Good ?Remote;   Good  ?Judgement:  Good  ?Insight:  Good  ?Psychomotor Activity:  Normal  ?Concentration:  Concentration: Fair and Attention Span: Good  ?  Recall:  Good  ?Fund of Knowledge: Good  ?Language: Good  ?Akathisia:  No  ?Handed:  Right  ?AIMS (if indicated): not done  ?Assets:  Communication Skills ?Desire for Improvement ?Housing ?Physical Health ?Social Support ?Vocational/Educational  ?ADL's:  Intact  ?Cognition: WNL  ?Sleep:  Fair  ? ?Screenings: ?GAD-7   ? ?Flowsheet Row Video Visit from 07/23/2021 in [facility] Counselor from 06/18/2021 in [facility] Office Visit from [facility]/22/2021 in [facility]  ?Total GAD-7 Score 16 0 18  ? ?  ? ?PHQ2-9   ? ?Flowsheet Row Video Visit from 07/23/2021 in [facility] Counselor from 06/18/2021 in [facility] Office Visit from 04/22/2021 in [facility]  ?PHQ-2 Total Score 4 2 4   ?PHQ-9 Total Score 14 9 19   ? ?   ? ?Flowsheet Row Video Visit from 07/23/2021 in [facility] Counselor from 06/18/2021 in [facility] Office Visit from 04/22/2021 in [facility]

## 2021-08-14 ENCOUNTER — Other Ambulatory Visit (HOSPITAL_COMMUNITY): Payer: Self-pay | Admitting: Family

## 2021-08-14 DIAGNOSIS — F411 Generalized anxiety disorder: Secondary | ICD-10-CM

## 2021-08-14 DIAGNOSIS — F331 Major depressive disorder, recurrent, moderate: Secondary | ICD-10-CM

## 2021-08-18 ENCOUNTER — Ambulatory Visit (INDEPENDENT_AMBULATORY_CARE_PROVIDER_SITE_OTHER): Payer: Medicaid Other | Admitting: Clinical

## 2021-08-18 DIAGNOSIS — F331 Major depressive disorder, recurrent, moderate: Secondary | ICD-10-CM | POA: Diagnosis not present

## 2021-08-19 NOTE — Progress Notes (Signed)
THERAPIST PROGRESS NOTE  Session Time: 30 minutes  Participation Level: Active  Behavioral Response: CasualAlertEuthymic  Type of Therapy: Individual Therapy  Treatment Goals addressed: patient will reduce frequency of avoidant behaviors by 50% as evidenced by self report in therapy sessions  ProgressTowards Goals: Progressing  Interventions: CBT and Supportive  Summary:  Julie Rios is a 21 y.o. female who presents for scheduled session oriented x5, appropriately dressed, and friendly.  Client denied hallucinations and delusions. Client reported on today that she is feeling well but has been having some spurts of sadness.  Client reported her sadness is attributed to today being the anniversary of her father's passing.  Client reported she does a pretty good job of excepting grief and thinking about positive memories of her mother and father.  Client reported she does allow herself moments to cry when she needs to.  Client reported she enjoys the sunny days like this on today because it is a reminder to her that her parents are both looking down on her happily and giving her a hug.  Client reported otherwise she does a pretty good time of keeping herself occupied with doing yard work, spending time with her boyfriend and researching other hobbies that she would like to engage in.  Client reported she is considering gardening next.  Client reported medication management has been going very well for her.  Client reported she needed a slight adjustment due to noticing decreased libido.  Client reported so long as she stays consistent with her medication she has no issues with auditory or visual hallucinations.  Client reported she is sleeping well at night and her appetite is pretty good for the most part. Client reported they will be going to the beach to do a balloon release which is also where they released some of her mothers mothers and will also do the same for her dad.  Evidence of  progress towards goal:  client reported she is medication compliant 7 out of 7 days per week. Client reported her continued goals for therapy are to work on self esteem and communication to improve social anxiety.   Suicidal/Homicidal: Nowithout intent/plan  Therapist Response:  Therapist began the appointment asking the client how she has been doing since last seen. Therapist used CBT to engage using active listening and positive emotional support towards her thoughts and feelings. Therapist used CBT to engage and ask the client about medication compliance and side effects. Therapist used CBT to ask the client about stressors causing her a depressed mood. Therapist used CBT to discuss grief stages and normalize her emotions. Therapist used CBT ask the client to identify her progress with frequency of use with coping skills with continued practice in her daily activity.    Therapist assigned the client homework to continue with special rituals to remember her loved ones, normalize her emotions of grief when it occurs and practice self care. Client was scheduled for next appointment.    Plan: Return again in 5 weeks.  Diagnosis: moderate episode of major depressive disorder  Collaboration of Care: Patient refused AEB none requested by the client at this time.  Patient/Guardian was advised Release of Information must be obtained prior to any record release in order to collaborate their care with an outside provider. Patient/Guardian was advised if they have not already done so to contact the registration department to sign all necessary forms in order for [place] to release information regarding their care.   Consent: Patient/Guardian gives verbal consent for  treatment and assignment of benefits for services provided during this visit. Patient/Guardian expressed understanding and agreed to proceed.   Neena Rhymes Julie Riese, LCSW 08/18/2021

## 2021-08-19 NOTE — Plan of Care (Signed)
Client was in agreemt

## 2021-08-28 ENCOUNTER — Emergency Department: Payer: Medicaid Other

## 2021-08-28 ENCOUNTER — Other Ambulatory Visit: Payer: Self-pay

## 2021-08-28 ENCOUNTER — Emergency Department
Admission: EM | Admit: 2021-08-28 | Discharge: 2021-08-28 | Disposition: A | Discharge status: Home / Self Care | Payer: Medicaid Other | Attending: Emergency Medicine | Admitting: Emergency Medicine

## 2021-08-28 DIAGNOSIS — N12 Tubulo-interstitial nephritis, not specified as acute or chronic: Secondary | ICD-10-CM | POA: Insufficient documentation

## 2021-08-28 DIAGNOSIS — R000 Tachycardia, unspecified: Secondary | ICD-10-CM | POA: Diagnosis not present

## 2021-08-28 DIAGNOSIS — R1031 Right lower quadrant pain: Secondary | ICD-10-CM | POA: Diagnosis present

## 2021-08-28 LAB — COMPREHENSIVE METABOLIC PANEL WITH GFR
ALT: 15 U/L (ref 0–44)
AST: 17 U/L (ref 15–41)
Albumin: 4.2 g/dL (ref 3.5–5.0)
Alkaline Phosphatase: 61 U/L (ref 38–126)
Anion gap: 8 (ref 5–15)
BUN: 10 mg/dL (ref 6–20)
CO2: 22 mmol/L (ref 22–32)
Calcium: 8.8 mg/dL — ABNORMAL LOW (ref 8.9–10.3)
Chloride: 107 mmol/L (ref 98–111)
Creatinine, Ser: 0.66 mg/dL (ref 0.44–1.00)
GFR, Estimated: >60 mL/min
Glucose, Bld: 100 mg/dL — ABNORMAL HIGH (ref 70–99)
Potassium: 3.9 mmol/L (ref 3.5–5.1)
Sodium: 137 mmol/L (ref 135–145)
Total Bilirubin: 0.5 mg/dL (ref 0.3–1.2)
Total Protein: 7.5 g/dL (ref 6.5–8.1)

## 2021-08-28 LAB — URINALYSIS, ROUTINE W REFLEX MICROSCOPIC
Bilirubin Urine: NEGATIVE
Glucose, UA: NEGATIVE mg/dL
Ketones, ur: NEGATIVE mg/dL
Nitrite: NEGATIVE
Protein, ur: 100 mg/dL — AB
RBC / HPF: >50 RBC/hpf — ABNORMAL HIGH (ref 0–5)
Specific Gravity, Urine: 1.009 (ref 1.005–1.030)
WBC, UA: >50 WBC/hpf — ABNORMAL HIGH (ref 0–5)
pH: 5 (ref 5.0–8.0)

## 2021-08-28 LAB — CBC WITH DIFFERENTIAL/PLATELET
Abs Immature Granulocytes: 0.07 K/uL (ref 0.00–0.07)
Basophils Absolute: 0.1 K/uL (ref 0.0–0.1)
Basophils Relative: 0 %
Eosinophils Absolute: 0.3 K/uL (ref 0.0–0.5)
Eosinophils Relative: 2 %
HCT: 40.2 % (ref 36.0–46.0)
Hemoglobin: 13.1 g/dL (ref 12.0–15.0)
Immature Granulocytes: 1 %
Lymphocytes Relative: 14 %
Lymphs Abs: 2 K/uL (ref 0.7–4.0)
MCH: 27.9 pg (ref 26.0–34.0)
MCHC: 32.6 g/dL (ref 30.0–36.0)
MCV: 85.7 fL (ref 80.0–100.0)
Monocytes Absolute: 1 K/uL (ref 0.1–1.0)
Monocytes Relative: 7 %
Neutro Abs: 10.8 K/uL — ABNORMAL HIGH (ref 1.7–7.7)
Neutrophils Relative %: 76 %
Platelets: 376 K/uL (ref 150–400)
RBC: 4.69 MIL/uL (ref 3.87–5.11)
RDW: 13.5 % (ref 11.5–15.5)
WBC: 14.2 K/uL — ABNORMAL HIGH (ref 4.0–10.5)
nRBC: 0 % (ref 0.0–0.2)

## 2021-08-28 LAB — LIPASE, BLOOD: Lipase: 30 U/L (ref 11–51)

## 2021-08-28 LAB — POC URINE PREG, ED: Preg Test, Ur: NEGATIVE

## 2021-08-28 IMAGING — CT CT ABD-PELV W/ CM
2 of 4 series · 15 of 46 positions shown, 17 images · IV contrast (APPLIED)
Comparison: CT abdomen pelvis 05/24/2019

CLINICAL DATA: Flank pain, kidney stone suspected

EXAM:
CT ABDOMEN AND PELVIS WITH CONTRAST
TECHNIQUE: Multidetector CT imaging of the abdomen and pelvis was performed
using the standard protocol following bolus administration of
intravenous contrast.

[Series 2: routine abd/pel with · axial · 0.72mm/px · z∈[-760,-304]mm · 12 of 101 slices shown, 14 images]
[im 5/101  soft-tissue]
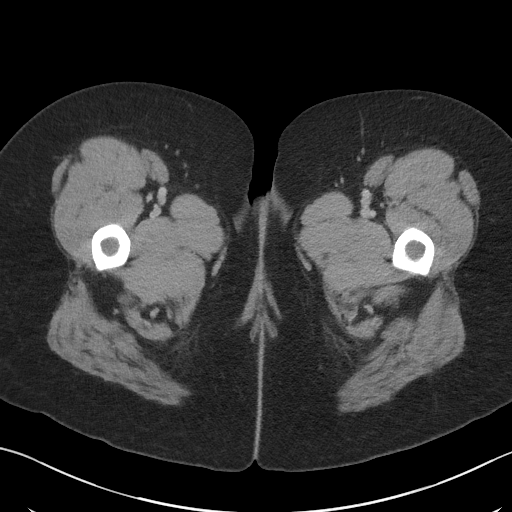
[im 5/101  bone]
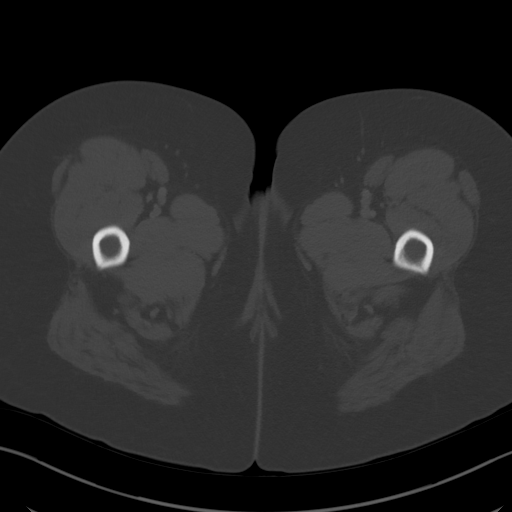
[im 13/101  soft-tissue]
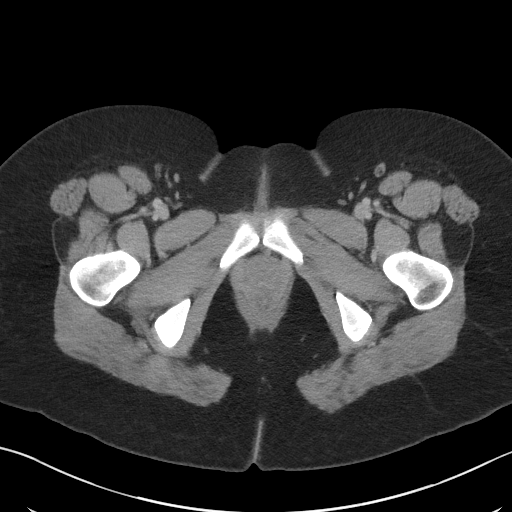
[im 21/101  soft-tissue]
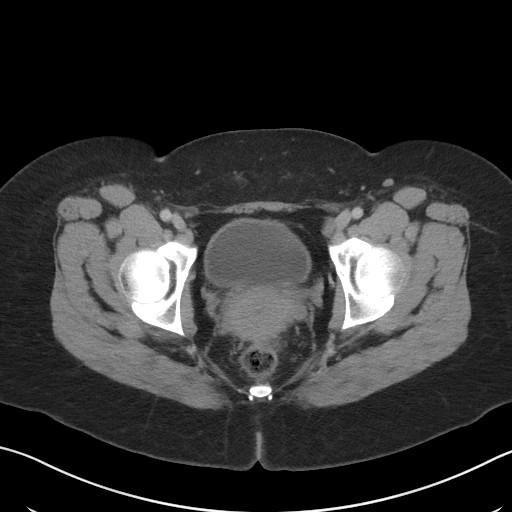
[im 30/101  soft-tissue]
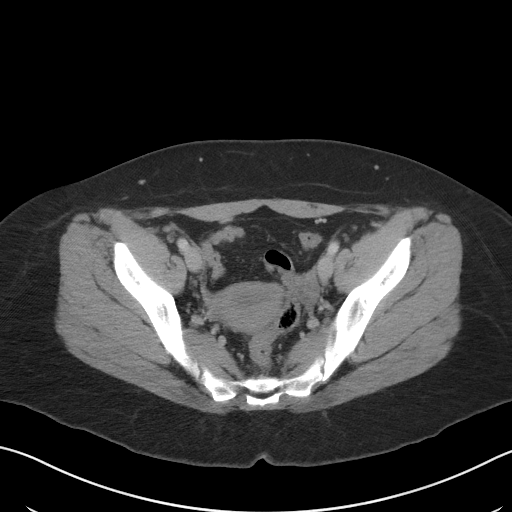
[im 38/101  soft-tissue]
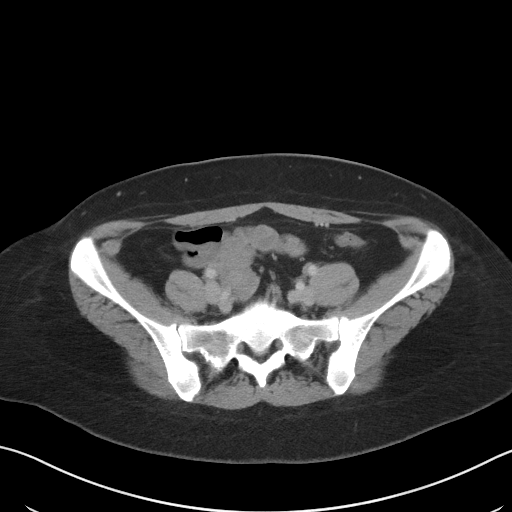
[im 46/101  soft-tissue]
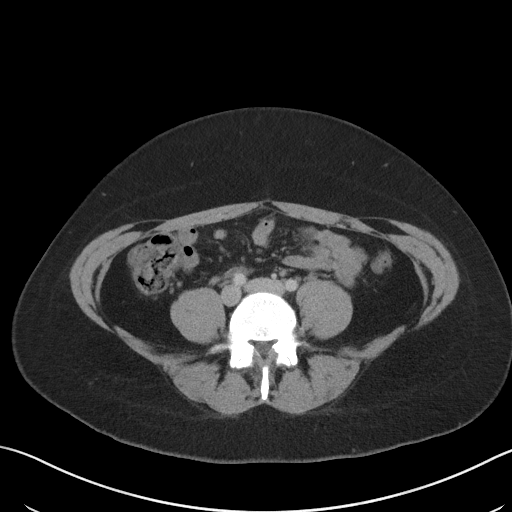
[im 55/101  soft-tissue]
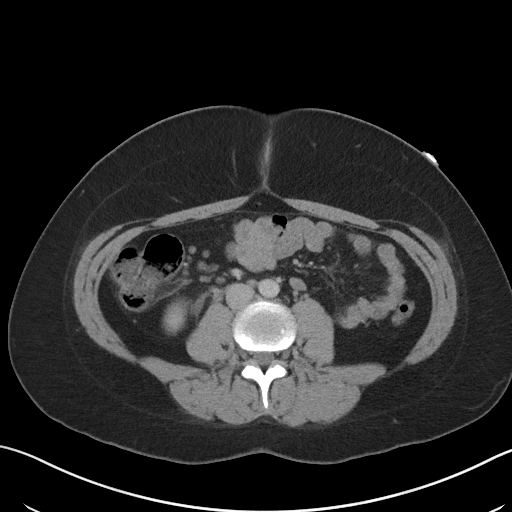
[im 63/101  soft-tissue]
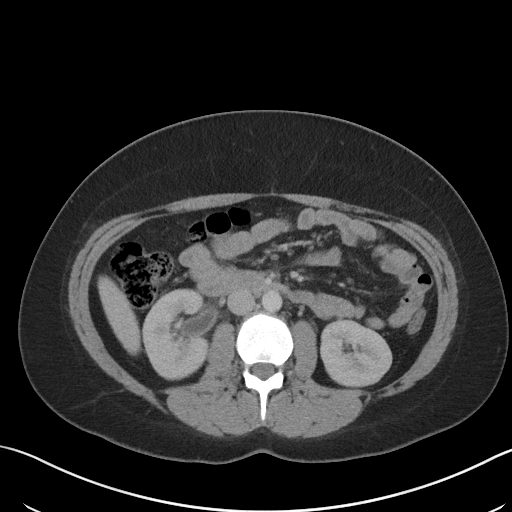
[im 71/101  soft-tissue]
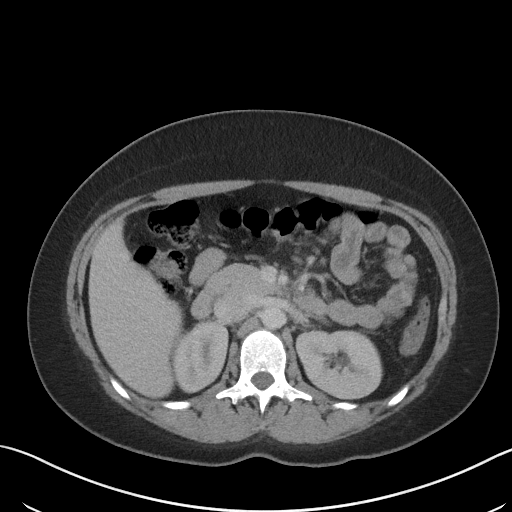
[im 71/101  bone]
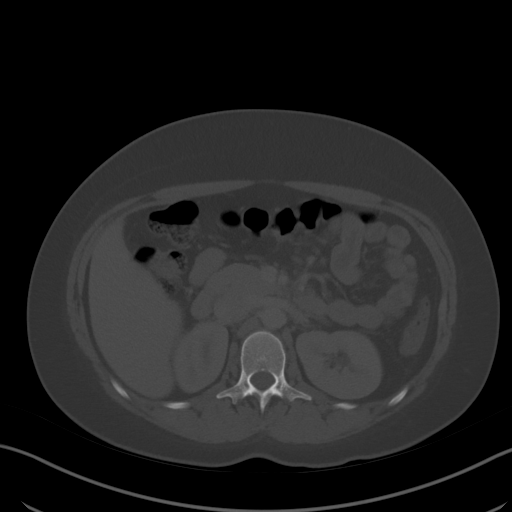
[im 80/101  soft-tissue]
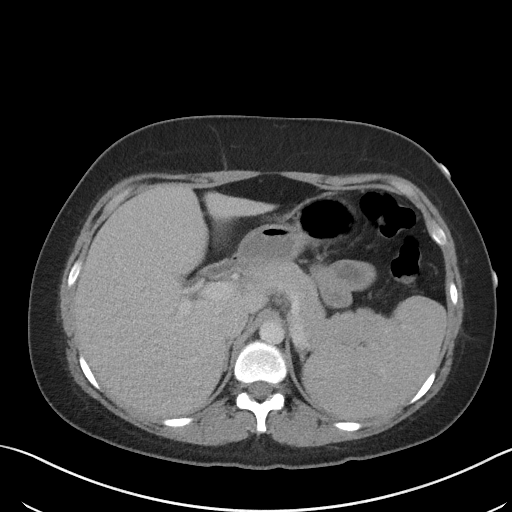
[im 88/101  soft-tissue]
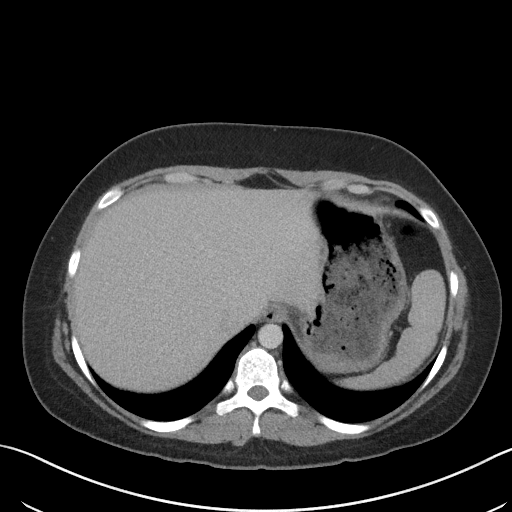
[im 96/101  soft-tissue]
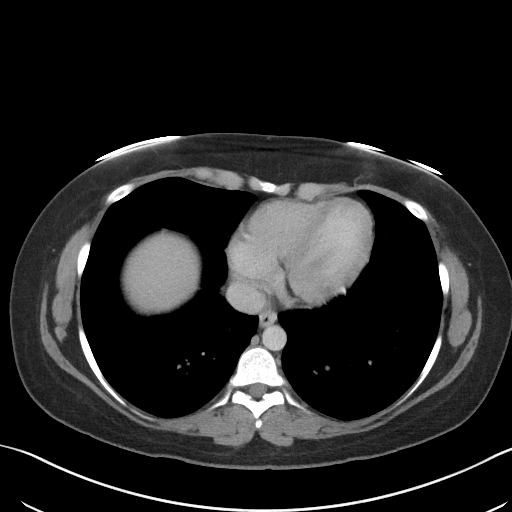

[Series 5: coronal st · coronal · 0.70mm/px · 3 of 97 slices shown]
[im 33/97  soft-tissue]
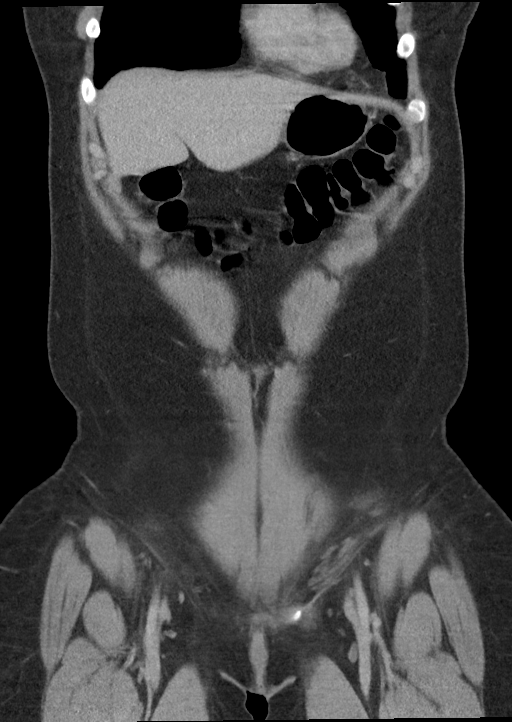
[im 43/97  soft-tissue]
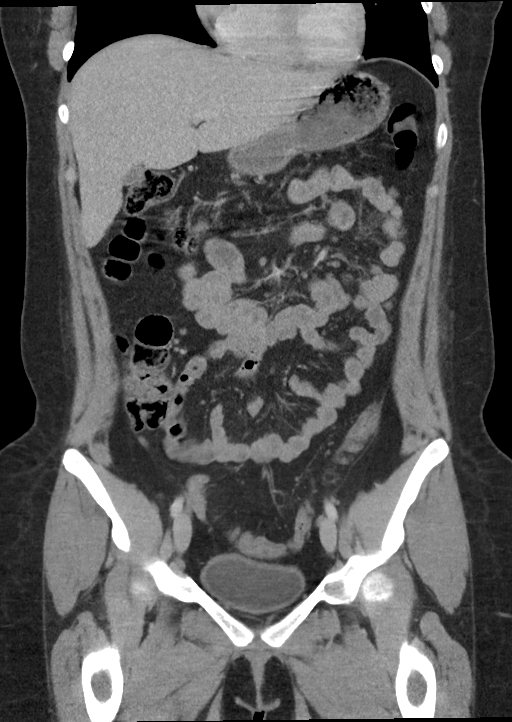
[im 54/97  soft-tissue]
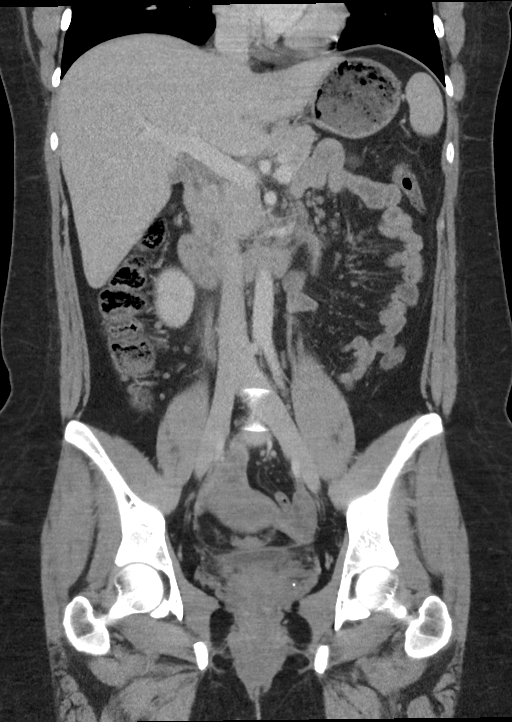

[15 of 46 positions shown; findings below may reference images not displayed]

RADIATION DOSE REDUCTION: This exam was performed according to the
departmental dose-optimization program which includes automated
exposure control, adjustment of the mA and/or kV according to
patient size and/or use of iterative reconstruction technique.

CONTRAST:  100mL OMNIPAQUE IOHEXOL 300 MG/ML  SOLN
FINDINGS: Lower chest: No acute abnormality.

Hepatobiliary: No focal liver abnormality. No gallstones,
gallbladder wall thickening, or pericholecystic fluid. No biliary
dilatation.

Pancreas: No focal lesion. Normal pancreatic contour. No surrounding
inflammatory changes. No main pancreatic ductal dilatation.

Spleen: Normal in size without focal abnormality.

Adrenals/Urinary Tract:

No adrenal nodule bilaterally.

Bilateral kidneys enhance symmetrically. Right urothelial
thickening. Associated Peri ureteral fat stranding.

No nephroureterolithiasis.  No hydronephrosis. No hydroureter.

The urinary bladder is unremarkable.

Stomach/Bowel: Stomach is within normal limits. No evidence of bowel
wall thickening or dilatation. Appendix appears normal.

Vascular/Lymphatic: No abdominal aorta or iliac aneurysm. No
abdominal, pelvic, or inguinal lymphadenopathy.

Reproductive: Uterus and bilateral adnexa are unremarkable.

Other:

No abdominal wall hernia or abnormality.

No suspicious lytic or blastic osseous lesions. No acute displaced
fracture. Multilevel degenerative changes of the spine.

Musculoskeletal:

No abdominal wall hernia or abnormality.

No suspicious lytic or blastic osseous lesions. No acute displaced
fracture.
IMPRESSION: Right urothelial thickening suggestive of infection. Correlate with
urinalysis.

## 2021-08-28 MED ORDER — IOHEXOL 300 MG/ML  SOLN
100.0000 mL | Freq: Once | INTRAMUSCULAR | Status: AC | PRN
  Administered 2021-08-28: 100 mL via INTRAVENOUS
  Filled 2021-08-28: qty 100

## 2021-08-28 MED ORDER — SODIUM CHLORIDE 0.9 % IV BOLUS
1000.0000 mL | Freq: Once | INTRAVENOUS | Status: AC
  Administered 2021-08-28: 1000 mL via INTRAVENOUS

## 2021-08-28 MED ORDER — OXYCODONE-ACETAMINOPHEN 5-325 MG PO TABS
1.0000 | ORAL_TABLET | Freq: Four times a day (QID) | ORAL | 0 refills | Status: AC | PRN
Start: 2021-08-28 — End: 2021-08-31

## 2021-08-28 MED ORDER — OXYCODONE-ACETAMINOPHEN 5-325 MG PO TABS
1.0000 | ORAL_TABLET | Freq: Once | ORAL | Status: AC
  Administered 2021-08-28: 1 via ORAL
  Filled 2021-08-28: qty 1

## 2021-08-28 MED ORDER — ONDANSETRON 4 MG PO TBDP: 4.0000 mg | ORAL_TABLET | Freq: Three times a day (TID) | ORAL | 0 refills | Status: AC | PRN

## 2021-08-28 MED ORDER — SODIUM CHLORIDE 0.9 % IV SOLN
1.0000 g | Freq: Once | INTRAVENOUS | Status: AC
  Administered 2021-08-28: 1 g via INTRAVENOUS
  Filled 2021-08-28: qty 10

## 2021-08-28 MED ORDER — CEFDINIR 300 MG PO CAPS: 300.0000 mg | ORAL_CAPSULE | Freq: Two times a day (BID) | ORAL | 0 refills | Status: AC

## 2021-08-28 MED ORDER — ONDANSETRON 4 MG PO TBDP
4.0000 mg | ORAL_TABLET | Freq: Once | ORAL | Status: AC
Start: 2021-08-28 — End: 2021-08-28
  Administered 2021-08-28: 4 mg via ORAL
  Filled 2021-08-28: qty 1

## 2021-08-28 NOTE — ED Provider Notes (Signed)
? ? ?Provider Note ? ?Patient Contact: 3:38 PM (approximate) ? ? ?History  ? ?Back Pain ? ? ?HPI ? ?Julie Rios is a 21 y.o. female presents to the emergency department with right-sided flank pain that radiates into the right lower quadrant.  Patient states that she is also had some dysuria and increased urinary frequency.  She reports that she is prone to urinary tract infections.  She denies a history of pyelonephritis or nephrolithiasis.  She reports that she still has her appendix.  She has had some nausea and abdominal discomfort but no vomiting.  She denies experiencing similar pain in the past.  Denies radiation of her pain into the buttocks or the right leg. ? ?  ? ? ?Physical Exam  ? ?Triage Vital Signs: ?ED Triage Vitals [08/28/21 1432]  ?Enc Vitals Group  ?   BP (!) 142/98  ?   Pulse Rate (!) 119  ?   Resp 18  ?   Temp 98.1 ?F (36.7 ?C)  ?   Temp src   ?   SpO2 94 %  ?   Weight   ?   Height   ?   Head Circumference   ?   Peak Flow   ?   Pain Score   ?   Pain Loc   ?   Pain Edu?   ?   Excl. in [place]?   ? ? ?Most recent vital signs: ?Vitals:  ? 08/28/21 1432 08/28/21 1800  ?BP: (!) 142/98 125/83  ?Pulse: (!) 119 90  ?Resp: 18 18  ?Temp: 98.1 ?F (36.7 ?C)   ?SpO2: 94% 100%  ? ? ? ?General: Alert and in no acute distress. ?Eyes:  PERRL. EOMI. ?Head: No acute traumatic findings ?ENT: ?     Ears:  ?     Nose: No congestion/rhinnorhea. ?     Mouth/Throat: Mucous membranes are moist. ?Neck: No stridor. No cervical spine tenderness to palpation. ?Cardiovascular:  Good peripheral perfusion ?Respiratory: Normal respiratory effort without tachypnea or retractions. Lungs CTAB. Good air entry to the bases with no decreased or absent breath sounds. ?Gastrointestinal: Bowel sounds ?4 quadrants.  Patient has right lower quadrant abdominal tenderness with guarding.  No guarding or rigidity. No palpable masses. No distention. No CVA tenderness. ?Musculoskeletal: Full range of motion to all extremities.  Negative straight leg  raise. ?Neurologic:  No gross focal neurologic deficits are appreciated.  ?Skin:   No rash noted ?Other: ? ? ?ED Results / Procedures / Treatments  ? ?Labs ?(all labs ordered are listed, but only abnormal results are displayed) ?Labs Reviewed  ?URINALYSIS, ROUTINE W REFLEX MICROSCOPIC - Abnormal; Notable for the following components:  ?    Result Value  ? Color, Urine YELLOW (*)   ? APPearance CLOUDY (*)   ? Hgb urine dipstick LARGE (*)   ? Protein, ur 100 (*)   ? Leukocytes,Ua MODERATE (*)   ? RBC / HPF >50 (*)   ? WBC, UA >50 (*)   ? Bacteria, UA MANY (*)   ? All other components within normal limits  ?CBC WITH DIFFERENTIAL/PLATELET - Abnormal; Notable for the following components:  ? WBC 14.2 (*)   ? Neutro Abs 10.8 (*)   ? All other components within normal limits  ?COMPREHENSIVE METABOLIC PANEL - Abnormal; Notable for the following components:  ? Glucose, Bld 100 (*)   ? Calcium 8.8 (*)   ? All other components within normal limits  ?URINE CULTURE  ?LIPASE, BLOOD  ?  POC URINE PREG, ED  ? ? ? ? ? ?RADIOLOGY ? ?I personally viewed and evaluated these images as part of my medical decision making, as well as reviewing the written report by the radiologist. ? ?ED Provider Interpretation: I personally reviewed CT abdomen pelvis and agree with radiologist interpretation.  Appendix appears normal.  No signs of nephrolithiasis. ? ? ?PROCEDURES: ? ?Critical Care performed: No ? ?Procedures ? ? ?MEDICATIONS ORDERED IN ED: ?Medications  ?sodium chloride 0.9 % bolus 1,000 mL (1,000 mLs Intravenous New Bag/Given 08/28/21 1550)  ?iohexol (OMNIPAQUE) 300 MG/ML solution 100 mL (100 mLs Intravenous Contrast Given 08/28/21 1704)  ?cefTRIAXone (ROCEPHIN) 1 g in sodium chloride 0.9 % 100 mL IVPB (1 g Intravenous New Bag/Given 08/28/21 1755)  ?oxyCODONE-acetaminophen (PERCOCET/ROXICET) 5-325 MG per tablet 1 tablet (1 tablet Oral Given 08/28/21 1754)  ?ondansetron (ZOFRAN-ODT) disintegrating tablet 4 mg (4 mg Oral Given 08/28/21 1755)   ? ? ? ?IMPRESSION / MDM / ASSESSMENT AND PLAN / ED COURSE  ?I reviewed the triage vital signs and the nursing notes. ?             ?               ? ?Assessment and plan: ?Flank pain:  ?Differential diagnosis includes, but is not limited to, nephrolithiasis, pyelonephritis, appendicitis, UTI, lumbar strain... ? ?21 year old female with history of UTIs presents to the emergency department with right-sided flank pain, nausea, abdominal discomfort and pain in the right lower quadrant. ? ?Patient was tachycardic and mildly hypertensive at triage.  She had right-sided CVA tenderness and guarding with palpation of the right lower quadrant. ? ?We will obtain basic labs and obtain CT abdomen pelvis with contrast and will reassess.  Normal saline bolus given. ? ?CT abdomen pelvis findings suggest urethral inflammation but no findings suggestive of nephrolithiasis.  No signs of appendicitis.  We will treat patient for pyelonephritis with Rocephin and Omnicef as an outpatient.  Urine culture in process.  Patient's tachycardia improved with supplemental fluids.  Patient was prescribed a short course of Percocet for pain and was cautioned that she should use MiraLAX while using Percocet. ? ? ?FINAL CLINICAL IMPRESSION(S) / ED DIAGNOSES  ? ?Final diagnoses:  ?Pyelonephritis  ? ? ? ?Rx / DC Orders  ? ?ED Discharge Orders   ? ?      Ordered  ?  cefdinir (OMNICEF) 300 MG capsule  2 times daily       ? 08/28/21 1812  ?  oxyCODONE-acetaminophen (PERCOCET/ROXICET) 5-325 MG tablet  Every 6 hours PRN       ? 08/28/21 1812  ?  ondansetron (ZOFRAN-ODT) 4 MG disintegrating tablet  Every 8 hours PRN       ? 08/28/21 1812  ? ?  ?  ? ?  ? ? ? ?Note:  This document was prepared using Dragon voice recognition software and may include unintentional dictation errors. ?  ?Lannie Fields, PA-C ?08/28/21 1826 ? ?  ?Naaman Plummer, MD ?08/29/21 1151 ? ?

## 2021-08-28 NOTE — Discharge Instructions (Signed)
Take cefdinir twice daily for 10 days. ?

## 2021-08-28 NOTE — ED Notes (Signed)
Lab called at this time to add on urine pregnancy test.  ?

## 2021-08-28 NOTE — ED Notes (Signed)
Pt signed printed d/c paperwork.  

## 2021-08-28 NOTE — ED Triage Notes (Signed)
Pt c/o right lower back pain that radiates into the leg for the past 2-3 days. ?

## 2021-08-28 NOTE — ED Notes (Signed)
20 yof with c/c of right sided flank pain which radiates to her groin for the past 2 days.  ?

## 2021-08-28 NOTE — ED Notes (Signed)
Lab called again to confirm they had urine for pregnancy test. Confirmed they did.  ?

## 2021-08-30 LAB — URINE CULTURE: Culture: >=100000 — AB

## 2021-09-03 ENCOUNTER — Ambulatory Visit (INDEPENDENT_AMBULATORY_CARE_PROVIDER_SITE_OTHER): Payer: Medicaid Other | Admitting: Clinical

## 2021-09-03 DIAGNOSIS — F411 Generalized anxiety disorder: Secondary | ICD-10-CM

## 2021-09-04 NOTE — Progress Notes (Signed)
? ?  THERAPIST PROGRESS NOTE ? ?Session Time: 35 minutes ? ?Participation Level: Active ? ?Behavioral Response: CasualAlertEuthymic ? ?Type of Therapy: Individual Therapy ? ?Treatment Goals addressed: client will reduce frequency of avoidant behaviors by 50% as evidenced by self-report in therapy sessions ? ?ProgressTowards Goals: Progressing ? ?Interventions: CBT and Supportive ? ?Summary:  ?Julie Rios is a 21 y.o. female who presents for the scheduled session oriented times five, appropriately dressed, and friendly. Client denied hallucinations and delusions. ?Client reported on today she has been doing well. Client reported she is feeling surprised today that she is in a good mood on a rainy day. Client reported it is "unusual". Client reported since she was last seen she had a kidney infection but has been successfully treated for that and feels better. Client reported it has been a challenge having her mother in law and small children stay with them while the mother looks for another place to stay. Client reported it is chaotic trying to keep the house clean. Client reported overall her medication management is working well. Client reported her anxiety and depression and well maintained. Client reported she still has social anxiety but she is taking initiative to work on it bu challenging herself. Client reported she "psyche's" herself out of doing things. ?Evidence of progress towards goal:  client reported she has challenged herself to 1 social setting setting and making a point to talk to a stranger. Client reported having a feeling of accomplishment once she is done. ? ? ?Suicidal/Homicidal: Nowithout intent/plan ? ?Therapist Response:  ?Therapist began the appointment asking the client how she has been doing since she was last seen. ?Therapist used CBT to engage using active listening and positive emotional support. ?Therapist used CBT to ask the client about her medication compliance compared to  presenting symptoms. ?Therapist used CBT to ask the client about challenges she encounters and how attempts to problem solve. ?Therapist used CBT to engage and discuss progressive exposure and thought stopping. ?Therapist used CBT ask the client to identify her progress with frequency of use with coping skills with continued practice in her daily activity.    ?Therapist assigned the client homework to continue practicing challenging negative thoughts. ?Client was scheduled for next appointment. ? ? ? ?Plan: Return again in 5 weeks. ? ?Diagnosis: generalized anxiety disorder ? ?Collaboration of Care: Patient refused AEB none requested by the client. ? ?Patient/Guardian was advised Release of Information must be obtained prior to any record release in order to collaborate their care with an outside provider. Patient/Guardian was advised if they have not already done so to contact the registration department to sign all necessary forms in order for [place] to release information regarding their care.  ? ?Consent: Patient/Guardian gives verbal consent for treatment and assignment of benefits for services provided during this visit. Patient/Guardian expressed understanding and agreed to proceed.  ? ?Julie Rhymes Juanmiguel Defelice, LCSW ?09/03/2021 ? ?

## 2021-09-05 ENCOUNTER — Encounter (HOSPITAL_COMMUNITY): Payer: Self-pay

## 2021-09-05 NOTE — Plan of Care (Signed)
?  Problem: Depression CCP Problem  1 coping skills ?Goal: LTG: Julie Rios WILL SCORE LESS THAN 10 ON THE PATIENT HEALTH QUESTIONNAIRE (PHQ-9) ?Outcome: Progressing ?Goal: STG: Julie Rios WILL PARTICIPATE IN AT LEAST 80% OF SCHEDULED INDIVIDUAL PSYCHOTHERAPY SESSIONS ?Outcome: Progressing ?Goal: STG: Julie Rios WILL COMPLETE AT LEAST 80% OF ASSIGNED HOMEWORK ?Outcome: Progressing ?  ?Problem: Anxiety Disorder CCP Problem  1 coping skills ?Goal: LTG: Patient will score less than 5 on the Generalized Anxiety Disorder 7 Scale (GAD-7) ?Outcome: Progressing ?Goal: STG: Patient will reduce frequency of avoidant behaviors by 50% as evidenced by self-report in therapy sessions ?Outcome: Progressing ?  ?

## 2021-09-23 ENCOUNTER — Telehealth (INDEPENDENT_AMBULATORY_CARE_PROVIDER_SITE_OTHER): Payer: Medicaid Other | Admitting: Physician Assistant

## 2021-09-23 DIAGNOSIS — F411 Generalized anxiety disorder: Secondary | ICD-10-CM

## 2021-09-23 DIAGNOSIS — F331 Major depressive disorder, recurrent, moderate: Secondary | ICD-10-CM | POA: Diagnosis not present

## 2021-09-24 MED ORDER — FLUOXETINE HCL 40 MG PO CAPS: 40.0000 mg | ORAL_CAPSULE | Freq: Every day | ORAL | 2 refills | Status: DC

## 2021-09-24 MED ORDER — BUPROPION HCL ER (XL) 150 MG PO TB24: 150.0000 mg | ORAL_TABLET | ORAL | 2 refills | Status: DC

## 2021-09-24 NOTE — Progress Notes (Addendum)
BH MD/PA/NP OP Progress Note  Virtual Visit via Telephone Note  I connected with Julie Rios on 09/24/21 at  3:30 PM EDT by telephone and verified that I am speaking with the correct person using two identifiers.  Location: Patient: Home Provider: Clinic   I discussed the limitations, risks, security and privacy concerns of performing an evaluation and management service by telephone and the availability of in person appointments. I also discussed with the patient that there may be a patient responsible charge related to this service. The patient expressed understanding and agreed to proceed.  Follow Up Instructions:   I discussed the assessment and treatment plan with the patient. The patient was provided an opportunity to ask questions and all were answered. The patient agreed with the plan and demonstrated an understanding of the instructions.   The patient was advised to call back or seek an in-person evaluation if the symptoms worsen or if the condition fails to improve as anticipated.  I provided 10 minutes of non-face-to-face time during this encounter.  Meta HatchetUchenna E Clayborn Milnes, PA   07/23/2021 3:06 PM Jordain Roberson  MRN:  [id number]  Chief Complaint:  No chief complaint on file.  HPI:   Julie Rios is a 21 year old female with a past psychiatric history significant for major depressive disorder and generalized anxiety disorder [facility] via virtual video visit for follow-up and medication management.  Patient is currently being managed on the following medications:   Prozac 40 mg daily Bupropion (Wellbutrin XL) 150 mg 24-hour tablet daily  Patient reports that her usage of her medications has been going really well.  Patient denies experiencing any complications or adverse side effects from her current medication regimen.  Patient denies depression but endorses a little anxiety.  Patient reports that her anxiety is manageable  and rates her anxiety at 5 out of 10.  Patient denies any new stressors at this time.  A GAD-7 screen was performed with the patient scoring a 12.  Patient is alert and oriented x4, calm, cooperative, and fully engaged in conversation during the encounter.  Patient endorses happy mood.  Patient denies suicidal or homicidal ideations.  She further denies auditory or visual hallucinations and does not appear to be responding to internal/external stimuli.  Patient endorses good sleep when she is able to take her medication before noon.  Patient reports that she receives roughly 8 hours of sleep each night but if she takes her medication afternoon, she experiences 4 to 5 hours of sleep.  Patient endorses episodes of binge eating and food craving but states that she normally eats roughly 3 meals per day.  Patient denies alcohol consumption, tobacco use, and illicit drug use.   Visit Diagnosis:    ICD-10-CM   1. Moderate episode of recurrent major depressive disorder (HCC)  F33.1 FLUoxetine (PROZAC) 40 MG capsule    buPROPion (WELLBUTRIN XL) 150 MG 24 hr tablet    2. Generalized anxiety disorder  F41.1 FLUoxetine (PROZAC) 40 MG capsule      Past Psychiatric History:  Major depressive disorder Generalized anxiety disorder  Past Medical History: No past medical history on file. No past surgical history on file.  Family Psychiatric History:  Sister - Depression, borderline personality  Family History:  Family History  Problem Relation Age of Onset   Diabetes Mother    Cirrhosis Mother     Social History:  Social History   Socioeconomic History   Marital status: Single  Spouse name: Not on file   Number of children: Not on file   Years of education: Not on file   Highest education level: Not on file  Occupational History   Not on file  Tobacco Use   Smoking status: Passive Smoke Exposure - Never Smoker   Smokeless tobacco: Never  Substance and Sexual Activity   Alcohol use: No    Drug use: No   Sexual activity: Not Currently  Other Topics Concern   Not on file  Social History Narrative   Not on file   Social Determinants of Health   Financial Resource Strain: Not on file  Food Insecurity: Not on file  Transportation Needs: Not on file  Physical Activity: Not on file  Stress: Not on file  Social Connections: Not on file    Allergies: No Known Allergies  Metabolic Disorder Labs: Lab Results  Component Value Date   HGBA1C 5.1 04/17/2021   MPG 99.67 04/17/2021   No results found for: PROLACTIN Lab Results  Component Value Date   CHOL 150 04/17/2021   TRIG 71 04/17/2021   HDL 38 (L) 04/17/2021   CHOLHDL 3.9 04/17/2021   VLDL 14 04/17/2021   LDLCALC 98 04/17/2021   Lab Results  Component Value Date   TSH 1.495 04/17/2021    Therapeutic Level Labs: No results found for: LITHIUM No results found for: VALPROATE No components found for:  CBMZ  Current Medications: Current Outpatient Medications  Medication Sig Dispense Refill   buPROPion (WELLBUTRIN XL) 150 MG 24 hr tablet Take 1 tablet (150 mg total) by mouth every morning. 30 tablet 2   FLUoxetine (PROZAC) 40 MG capsule Take 1 capsule (40 mg total) by mouth daily. 30 capsule 2   No current facility-administered medications for this visit.     Musculoskeletal: Strength & Muscle Tone: within normal limits Gait & Station: normal Patient leans: N/A  Psychiatric Specialty Exam: Review of Systems  Psychiatric/Behavioral:  Positive for sleep disturbance. Negative for decreased concentration, dysphoric mood, hallucinations, self-injury and suicidal ideas. The patient is nervous/anxious. The patient is not hyperactive.    There were no vitals taken for this visit.There is no height or weight on file to calculate BMI.  General Appearance: Casual  Eye Contact:  Good  Speech:  Clear and Coherent and Normal Rate  Volume:  Normal  Mood:  Anxious and Depressed  Affect:  Congruent  Thought  Process:  Coherent, Goal Directed, and Descriptions of Associations: Intact  Orientation:  Full (Time, Place, and Person)  Thought Content: WDL   Suicidal Thoughts:  No  Homicidal Thoughts:  No  Memory:  Immediate;   Good Recent;   Good Remote;   Good  Judgement:  Good  Insight:  Good  Psychomotor Activity:  Normal  Concentration:  Concentration: Fair and Attention Span: Good  Recall:  Good  Fund of Knowledge: Good  Language: Good  Akathisia:  No  Handed:  Right  AIMS (if indicated): not done  Assets:  Communication Skills Desire for Improvement Housing Physical Health Social Support Vocational/Educational  ADL's:  Intact  Cognition: WNL  Sleep:  Fair   Screenings: GAD-7    Flowsheet Row Video Visit from 09/23/2021 in [facility] Video Visit from 07/23/2021 in [facility] Counselor from 06/18/2021 in [facility] Office Visit from 04/22/2021 in [facility]  Total GAD-7 Score 12 16 0 18      PHQ2-9  Flowsheet Row Video Visit from 09/23/2021 in [facility] Counselor from 09/03/2021 in [facility] Video Visit from 07/23/2021 in [facility] Counselor from 06/18/2021 in [facility] Office Visit from 04/22/2021 in [facility]  PHQ-2 Total Score 1 0 4 2 4   PHQ-9 Total Score -- 0 14 9 19       Flowsheet Row Video Visit from 09/23/2021 in [facility] ED from 08/28/2021 in [facility] EMERGENCY DEPARTMENT Video Visit from 07/23/2021 in [facility]  C-SSRS RISK CATEGORY No Risk No Risk No Risk        Assessment and Plan:   Temprance Canal is a 21 year old female with a past psychiatric history significant for major depressive disorder and generalized  anxiety disorder who presents to [facility] via virtual video visit for follow-up and medication management.  Patient denies experiencing any depressive symptoms.  She endorses some anxiety but states that her anxiety has been manageable.  Patient denies the need for dosage adjustments at this time and is requesting refills on all of her medications following conclusion of the encounter.  Collaboration of Care: Collaboration of Care: Medication Management AEB provider managing patient's psychiatric medications, Psychiatrist AEB patient being followed by mental health provider, and Referral or follow-up with counselor/therapist AEB patient being seen by a licensed clinical social worker for therapy at this facility  Patient/Guardian was advised Release of Information must be obtained prior to any record release in order to collaborate their care with an outside provider. Patient/Guardian was advised if they have not already done so to contact the registration department to sign all necessary forms in order for 26 to release information regarding their care.   Consent: Patient/Guardian gives verbal consent for treatment and assignment of benefits for services provided during this visit. Patient/Guardian expressed understanding and agreed to proceed.   1. Moderate episode of recurrent major depressive disorder (HCC)  - FLUoxetine (PROZAC) 40 MG capsule; Take 1 capsule (40 mg total) by mouth daily.  Dispense: 30 capsule; Refill: 2 - buPROPion (WELLBUTRIN XL) 150 MG 24 hr tablet; Take 1 tablet (150 mg total) by mouth every morning.  Dispense: 30 tablet; Refill: 2  2. Generalized anxiety disorder  - FLUoxetine (PROZAC) 40 MG capsule; Take 1 capsule (40 mg total) by mouth daily.  Dispense: 30 capsule; Refill: 2  Patient to follow up in 2 months Provider spent a total of 21 minutes with the patient/reviewing patient's chart  [facility], PA 07/23/2021, 3:06  PM

## 2021-09-26 ENCOUNTER — Encounter (HOSPITAL_COMMUNITY): Payer: Self-pay | Admitting: Physician Assistant

## 2021-10-12 ENCOUNTER — Ambulatory Visit: Payer: Medicaid Other | Admitting: Nurse Practitioner

## 2021-10-12 NOTE — Plan of Care (Signed)
  Problem: Depression CCP Problem  1 coping skills Goal: LTG: Julie Rios WILL SCORE LESS THAN 10 ON THE PATIENT HEALTH QUESTIONNAIRE (PHQ-9) Outcome: Progressing Goal: STG: Julie Rios WILL PARTICIPATE IN AT LEAST 80% OF SCHEDULED INDIVIDUAL PSYCHOTHERAPY SESSIONS Outcome: Progressing Goal: STG: Julie Rios WILL COMPLETE AT LEAST 80% OF ASSIGNED HOMEWORK Outcome: Progressing   Problem: Anxiety Disorder CCP Problem  1 coping skills Goal: LTG: Patient will score less than 5 on the Generalized Anxiety Disorder 7 Scale (GAD-7) Outcome: Progressing Goal: STG: Patient will reduce frequency of avoidant behaviors by 50% as evidenced by self-report in therapy sessions Outcome: Progressing

## 2021-11-02 ENCOUNTER — Ambulatory Visit: Payer: Medicaid Other | Admitting: Nurse Practitioner

## 2021-11-03 ENCOUNTER — Ambulatory Visit (INDEPENDENT_AMBULATORY_CARE_PROVIDER_SITE_OTHER): Payer: Medicaid Other | Admitting: Clinical

## 2021-11-03 DIAGNOSIS — F331 Major depressive disorder, recurrent, moderate: Secondary | ICD-10-CM | POA: Diagnosis not present

## 2021-11-07 NOTE — Plan of Care (Signed)
  Problem: Depression CCP Problem  1 coping skills Goal: STG: Jermia WILL PARTICIPATE IN AT LEAST 80% OF SCHEDULED INDIVIDUAL PSYCHOTHERAPY SESSIONS Outcome: Progressing Goal: STG: Franziska WILL COMPLETE AT LEAST 80% OF ASSIGNED HOMEWORK Outcome: Progressing   Problem: Depression CCP Problem  1 coping skills Goal: LTG: Lorelei WILL SCORE LESS THAN 10 ON THE PATIENT HEALTH QUESTIONNAIRE (PHQ-9) Outcome: Not Progressing   Problem: Anxiety Disorder CCP Problem  1 coping skills Goal: LTG: Patient will score less than 5 on the Generalized Anxiety Disorder 7 Scale (GAD-7) Outcome: Not Progressing Goal: STG: Patient will reduce frequency of avoidant behaviors by 50% as evidenced by self-report in therapy sessions Outcome: Not Progressing

## 2021-11-07 NOTE — Progress Notes (Signed)
   THERAPIST PROGRESS NOTE  Session Time: 30 minutes  Participation Level: Active  Behavioral Response: CasualAlertDepressed  Type of Therapy: Individual Therapy  Treatment Goals addressed: Client will reduce frequency of avoidant behaviors by 50% as evidenced by self-reported therapy sessions  ProgressTowards Goals: Not Progressing  Interventions: CBT and Supportive  Summary:  Julie Rios is a 21 y.o. female who presents for the scheduled session oriented x5, appropriately dressed, and friendly.  Client denied hallucinations and delusions. Client reported she has been feeling depressed.  Client reported she and her boyfriend have been going through financial strain.  Client reported he has been working jobs but it is still difficult for them to figure out bills as of lately.  Client reported she has been putting a lot of pressure on herself to find a job to help with the bills.  Client reported she is putting in applications but nobody has returned to her.  Client reported she knows working will be very difficult for her due to her anxiety and panic but wants to contribute.  Client reported her boyfriend has been very understanding of how her symptoms affect her in social in public settings and has not pressured her to work but she wants to.  Client reported Father's Day also passed recently and she was thinking about her father.  Client reported she has been thinking about the source of her anxiety and is not sure if it is trauma related.  Client reported she has been compliant with medications but sometimes experiences some aggravation.  Client reported being perceived side effect is manageable and she is fine with her current medication regimen. Evidence of progress towards goal: Client is compliant 7 out of 7 days a week with psychiatric medications.    Suicidal/Homicidal: Nowithout intent/plan  Therapist Response:  Therapist began the appointment asking the client how she has been  doing since last seen. Therapist used CBT to engage using active listening and positive emotional support. Therapist used CBT to engage and asked the client to identify the stressors that have been negatively affecting her. Therapist used CBT to discuss problem-solving skills and normalize the clients emotions. Therapist used CBT ask the client to identify her progress with frequency of use with coping skills with continued practice in her daily activity.    Therapist assigned client homework to practice self-care. Client was scheduled for next appointment.   Plan: Return again in 5 weeks.  Diagnosis: Moderate episode of recurrent major depressive disorder  Collaboration of Care: Patient refused AEB none  Patient/Guardian was advised Release of Information must be obtained prior to any record release in order to collaborate their care with an outside provider. Patient/Guardian was advised if they have not already done so to contact the registration department to sign all necessary forms in order for [place] to release information regarding their care.   Consent: Patient/Guardian gives verbal consent for treatment and assignment of benefits for services provided during this visit. Patient/Guardian expressed understanding and agreed to proceed.   Neena Rhymes Julie Gramajo, LCSW 11/03/2021

## 2021-11-23 ENCOUNTER — Ambulatory Visit (HOSPITAL_COMMUNITY): Payer: Self-pay | Admitting: Clinical

## 2021-11-24 ENCOUNTER — Encounter (HOSPITAL_COMMUNITY): Payer: Self-pay

## 2021-11-25 ENCOUNTER — Telehealth (HOSPITAL_COMMUNITY): Payer: Medicaid Other | Admitting: Physician Assistant

## 2022-01-05 ENCOUNTER — Encounter (HOSPITAL_COMMUNITY): Payer: Self-pay | Admitting: Physician Assistant

## 2022-01-05 ENCOUNTER — Telehealth (INDEPENDENT_AMBULATORY_CARE_PROVIDER_SITE_OTHER): Payer: Medicaid Other | Admitting: Physician Assistant

## 2022-01-05 DIAGNOSIS — F331 Major depressive disorder, recurrent, moderate: Secondary | ICD-10-CM | POA: Diagnosis not present

## 2022-01-05 DIAGNOSIS — F411 Generalized anxiety disorder: Secondary | ICD-10-CM | POA: Diagnosis not present

## 2022-01-05 MED ORDER — FLUOXETINE HCL 40 MG PO CAPS: 40.0000 mg | ORAL_CAPSULE | Freq: Every day | ORAL | 2 refills | Status: DC

## 2022-01-05 MED ORDER — BUPROPION HCL ER (XL) 150 MG PO TB24: 150.0000 mg | ORAL_TABLET | Freq: Every morning | ORAL | 2 refills | Status: DC

## 2022-01-05 NOTE — Progress Notes (Signed)
BH MD/PA/NP OP Progress Note  Virtual Visit via Telephone Note  I connected with Julie Rios on 01/05/22 at  1:00 PM EDT by telephone and verified that I am speaking with the correct person using two identifiers.  Location: Patient: Home Provider: Remote in [place]   I discussed the limitations, risks, security and privacy concerns of performing an evaluation and management service by telephone and the availability of in person appointments. I also discussed with the patient that there may be a patient responsible charge related to this service. The patient expressed understanding and agreed to proceed.  Follow Up Instructions:   I discussed the assessment and treatment plan with the patient. The patient was provided an opportunity to ask questions and all were answered. The patient agreed with the plan and demonstrated an understanding of the instructions.   The patient was advised to call back or seek an in-person evaluation if the symptoms worsen or if the condition fails to improve as anticipated.  I provided 20 minutes of non-face-to-face time during this encounter.   Julie Gip, MD    Julie Rios  MRN:  [id number]  Chief Complaint:  Chief Complaint  Patient presents with   Anxiety   Depression   HPI:  Julie Rios is a 21 year old female with a past psychiatric history significant for major depressive disorder and generalized anxiety disorder who presents to [facility] via virtual video visit for follow-up and medication management.  Patient is currently being managed on the following medications:   Prozac 40 mg daily Bupropion (Wellbutrin XL) 150 mg 24-hour tablet daily  Patient is seen by covering provider due to scheduling conflict; patient is amenable to proceeding with visit with this provider for today.   She reports reports things have been overall "good" and denies any significant issues or concerns. Continues to take  medications as prescribed. Denies feeling down or depressed. Anxiety is about the same; triggered by being around crowds and being in the car and is working with therapist on how to sit with discomfort. Endorses physiological symptoms of anxiety including heart racing. Feels medications have been helpful for anxiety although still room for improvement. Sleep has been stable as long as she takes medications at consistent time in the morning (reports taking at 9:30AM). Endorses increased sensitivity to heat on current medication regimen and has prioritized staying hydrated; denies fainting or lightheadedness. Endorses slight nausea and decreased appetite with approximate weight loss of 15 lbs since starting Wellbutrin (attributes this as a positive side effect of Wellbutrin); denies any episodes of binge eating since starting WBT. Feels this medication regimen has been working overall well.   Denies SI, HI, AVH.   Continues to look for jobs. Lives with her boyfriend and has been in this relationship for 3-4 years. Spends majority of her days listening to music and working on art. Feels safe in the home. Denies etoh or substance use.   Discussed option of further titrating medication but patient prefers to remain at current dosing and continue to engage in therapy. All questions/concerns addressed.  Visit Diagnosis:    ICD-10-CM   1. Moderate episode of recurrent major depressive disorder (HCC)  F33.1 buPROPion (WELLBUTRIN XL) 150 MG 24 hr tablet    FLUoxetine (PROZAC) 40 MG capsule    2. Generalized anxiety disorder  F41.1 FLUoxetine (PROZAC) 40 MG capsule      Past Psychiatric History:  Major depressive disorder Generalized anxiety disorder  Past Medical History: No past  medical history on file. No past surgical history on file.  Family Psychiatric History:  Sister - Depression, borderline personality  Family History:  Family History  Problem Relation Age of Onset   Diabetes Mother     Cirrhosis Mother     Social History:  Social History   Socioeconomic History   Marital status: Single    Spouse name: Not on file   Number of children: Not on file   Years of education: Not on file   Highest education level: Not on file  Occupational History   Not on file  Tobacco Use   Smoking status: Passive Smoke Exposure - Never Smoker   Smokeless tobacco: Never  Substance and Sexual Activity   Alcohol use: No   Drug use: No   Sexual activity: Not Currently  Other Topics Concern   Not on file  Social History Narrative   Not on file   Social Determinants of Health   Financial Resource Strain: Not on file  Food Insecurity: Not on file  Transportation Needs: Not on file  Physical Activity: Not on file  Stress: Not on file  Social Connections: Not on file    Allergies: No Known Allergies  Metabolic Disorder Labs: Lab Results  Component Value Date   HGBA1C 5.1 04/17/2021   MPG 99.67 04/17/2021   No results found for: "PROLACTIN" Lab Results  Component Value Date   CHOL 150 04/17/2021   TRIG 71 04/17/2021   HDL 38 (L) 04/17/2021   CHOLHDL 3.9 04/17/2021   VLDL 14 04/17/2021   LDLCALC 98 04/17/2021   Lab Results  Component Value Date   TSH 1.495 04/17/2021    Therapeutic Level Labs: No results found for: "LITHIUM" No results found for: "VALPROATE" No results found for: "CBMZ"  Current Medications: Current Outpatient Medications  Medication Sig Dispense Refill   buPROPion (WELLBUTRIN XL) 150 MG 24 hr tablet Take 1 tablet (150 mg total) by mouth in the morning. 30 tablet 2   FLUoxetine (PROZAC) 40 MG capsule Take 1 capsule (40 mg total) by mouth daily. 30 capsule 2   No current facility-administered medications for this visit.     Musculoskeletal: Strength & Muscle Tone: within normal limits Gait & Station: normal Patient leans: N/A  Psychiatric Specialty Exam: Review of Systems  Constitutional:  Positive for appetite change.   Gastrointestinal:  Positive for nausea.  Psychiatric/Behavioral:  Negative for dysphoric mood, hallucinations, self-injury and suicidal ideas. The patient is nervous/anxious.     There were no vitals taken for this visit.There is no height or weight on file to calculate BMI.  General Appearance: Casual  Eye Contact:  Good  Speech:  Clear and Coherent and Normal Rate  Volume:  Normal  Mood:   "good but still get anxious"  Affect:  Congruent  Thought Process:  Coherent, Goal Directed, and Linear  Orientation:  Full (Time, Place, and Person)  Thought Content: WDL   Suicidal Thoughts:  No  Homicidal Thoughts:  No  Memory:  Immediate;   Good Recent;   Good Remote;   Good  Judgement:  Good  Insight:  Good  Psychomotor Activity:  Normal  Concentration:  Concentration: Fair and Attention Span: Good  Recall:  Good  Fund of Knowledge: Good  Language: Good  Akathisia:  NA  Handed:  Right  AIMS (if indicated): not done  Assets:  Communication Skills Desire for Improvement Housing Physical Health Social Support Vocational/Educational  ADL's:  Intact  Cognition: WNL  Sleep:  Fair   Screenings: GAD-7    Flowsheet Row Video Visit from 09/23/2021 in [facility] Video Visit from 07/23/2021 in [facility] Counselor from 06/18/2021 in [facility] Office Visit from 04/22/2021 in [facility]  Total GAD-7 Score 12 16 0 18      PHQ2-9    Flowsheet Row Video Visit from 09/23/2021 in [facility] Counselor from 09/03/2021 in [facility] Video Visit from 07/23/2021 in  [facility] Counselor from 06/18/2021 in [facility] Office Visit from 04/22/2021 in [facility]  PHQ-2 Total Score 1 0 4 2 4   PHQ-9 Total Score -- 0 14 9 19       Flowsheet Row  Video Visit from 09/23/2021 in [facility] ED from 08/28/2021 in [facility] Video Visit from 07/23/2021 in [place] No Risk No Risk No Risk       Assessment and Plan:  Julie Rios is a 21 year old female with a past psychiatric history significant for major depressive disorder and generalized anxiety disorder who presents to [facility] via virtual video visit for follow-up and medication management.  Patient denies experiencing any depressive symptoms.  She endorses continued anxiety although improved on current medication regimen. Patient declines option for further titration of medications at this time, opting to remain at current doses and continue to engage in therapy. Refills provided for all psychiatric medications.    Collaboration of Care: Collaboration of Care: Medication Management AEB provider managing patient's psychiatric medications, Psychiatrist AEB patient being followed by mental health provider, and Referral or follow-up with counselor/therapist AEB patient being seen by a licensed clinical social worker for therapy at this facility  Patient/Guardian was advised Release of Information must be obtained prior to any record release in order to collaborate their care with an outside provider. Patient/Guardian was advised if they have not already done so to contact the registration department to sign all necessary forms in order for [place] to release information regarding their care.   Consent: Patient/Guardian gives verbal consent for treatment and assignment of benefits for services provided during this visit. Patient/Guardian expressed understanding and agreed to proceed.   1. Moderate episode of recurrent major depressive disorder (HCC) Status: improving  - FLUoxetine (PROZAC) 40 MG capsule; Take 1 capsule (40 mg total) by  mouth daily.  Dispense: 30 capsule; Refill: 2 - buPROPion (WELLBUTRIN XL) 150 MG 24 hr tablet; Take 1 tablet (150 mg total) by mouth every morning.  Dispense: 30 tablet; Refill: 2  2. Generalized anxiety disorder Status: chronic  - FLUoxetine (PROZAC) 40 MG capsule; Take 1 capsule (40 mg total) by mouth daily.  Dispense: 30 capsule; Refill: 2  Patient to follow up in 2 months with typical provider.  Provider spent a total of 35 minutes with the patient, reviewing patient's chart, documentation, and medication management.  Alda Berthold, MD 01/05/22

## 2022-01-05 NOTE — Patient Instructions (Signed)
Thank you for attending your appointment today.  -- We did not make any medication changes today. Please continue medications as prescribed.  Please do not make any changes to medications without first discussing with your provider. If you are experiencing a psychiatric emergency, please call 911 or present to your nearest emergency department. Additional crisis, medication management, and therapy resources are included below.  [facility]  [address], [place], [place] [address] [phone] or [phone] WALK-IN URGENT CARE 24/7 FOR ANYONE [address], , [place]  [phone] Fax: [phone] [email or web address] *Interpreters available *Accepts all insurance and uninsured for Urgent Care needs *Accepts Medicaid and uninsured for outpatient treatment (below)      ONLY FOR [place] Residents  Below:    Outpatient New Patient Assessment/Therapy Walk-ins:        Monday -Thursday 8am until slots are full.        Every Friday 1pm-4pm  (first come, first served)                   New Patient Psychiatry/Medication Management        Monday-Friday 8am-11am (first come, first served)               For all walk-ins we ask that you arrive by 7:15am, because patients will be seen in the order of arrival.   

## 2022-01-07 ENCOUNTER — Telehealth (HOSPITAL_COMMUNITY): Payer: Self-pay | Admitting: Clinical

## 2022-01-07 ENCOUNTER — Ambulatory Visit (HOSPITAL_COMMUNITY): Payer: Medicaid Other | Admitting: Clinical

## 2022-01-07 NOTE — Telephone Encounter (Signed)
Therapist contacted the client due to no showing for scheduled in person 1p therapy appointment. Therapist was unable to reach the client by telephone number left on file. Therapist left a voicemail with the office number if she would like to call back and reschedule.

## 2022-03-09 ENCOUNTER — Encounter (HOSPITAL_COMMUNITY): Payer: Self-pay | Admitting: Physician Assistant

## 2022-03-15 ENCOUNTER — Encounter (HOSPITAL_COMMUNITY): Payer: Self-pay | Admitting: Psychiatry

## 2022-03-15 ENCOUNTER — Telehealth (INDEPENDENT_AMBULATORY_CARE_PROVIDER_SITE_OTHER): Payer: Medicaid Other | Admitting: Psychiatry

## 2022-03-15 DIAGNOSIS — F331 Major depressive disorder, recurrent, moderate: Secondary | ICD-10-CM | POA: Diagnosis not present

## 2022-03-15 DIAGNOSIS — F411 Generalized anxiety disorder: Secondary | ICD-10-CM | POA: Diagnosis not present

## 2022-03-15 MED ORDER — FLUOXETINE HCL 20 MG PO CAPS: 20.0000 mg | ORAL_CAPSULE | Freq: Every day | ORAL | 2 refills | Status: DC

## 2022-03-15 NOTE — Patient Instructions (Signed)
Thank you for attending your appointment today.  -- RESTART Prozac at 20 mg daily -- Hold off on restarting Wellbutrin for the time being -- Continue other medications as prescribed.  Please do not make any changes to medications without first discussing with your provider. If you are experiencing a psychiatric emergency, please call 911 or present to your nearest emergency department. Additional crisis, medication management, and therapy resources are included below.  [facility]  [address], [place], [place] [address] [phone] WALK-IN URGENT CARE 24/7 FOR ANYONE [address] [place], [place] Fax: [phone] [email or web address] *Interpreters available *Accepts all insurance and uninsured for Urgent Care needs *Accepts Medicaid and uninsured for outpatient treatment (below)      ONLY FOR [facility]  Below:    Outpatient New Patient Assessment/Therapy Walk-ins:        Monday -Thursday 8am until slots are full.        Every Friday 1pm-4pm  (first come, first served)                   New Patient Psychiatry/Medication Management        Monday-Friday 8am-11am (first come, first served)               For all walk-ins we ask that you arrive by 7:15am, because patients will be seen in the order of arrival.

## 2022-03-15 NOTE — Progress Notes (Signed)
[place] MD Outpatient Progress Note  03/15/2022 4:20 PM Julie Rios  MRN:  [id number]  Assessment:  Julie Rios presents for follow-up evaluation. Today, 03/15/22, patient reports she has not taken psychiatric medications for past month (outside of taking one dose yesterday) with mild exacerbation of depressive symptoms. No acute safety concerns. She attributes medication non-adherence to poor sleep schedule as well as concern that medications will further interfere with sleep. Discussed restarting fluoxetine alone given greater probability that WBT was interfering with sleep (although could be related to fluoxetine as well). Will plan for continued titration at next visit if tolerating well. She also notes she has not followed up recently for therapy but has appointment currently scheduled. Plan to RTC in 7 weeks.  Identifying Information: Julie Rios is a 21 y.o. female with a history of MDD and GAD who is an established patient with [facility] participating in follow-up via video conferencing.   Plan:  # MDD  GAD Past medication trials: Wellbutrin XL Status of problem: mild exacerbation Interventions: -- RESTART Prozac at 20 mg daily -- DEFER restarting Bupropion XL for now as patient reports concern medication was interfering with sleep, leading to poor medication adherence -- Continue individual psychotherapy with Gara Kroner, LCSW  Patient was given contact information for behavioral health clinic and was instructed to call 911 for emergencies.   Subjective:  Chief Complaint:  Chief Complaint  Patient presents with   Medication Management    Interval History:   Today, patient reports she hasn't been taking medications consistently due to disrupted sleep schedule (waking up at 11AM most mornings). Notes sleep schedule being altered due to staying up too late with boyfriend and at times pulling all-nighters. She states when she wakes up too late, worried about  taking medications as they then make it hard to sleep the next night. Had been about a month without taking medications but did restart yesterday. States that she experienced vivid dreams last night after restarting medication but denies experiencing this on medications previously. Notes that without medications, mood has been a bit more depressed with more episodes of crying. Appetite has been normal. States she hasn't seen her therapist in a while which may also be contributing to mood worsening and has appt scheduled. Denies SI, HI, AVH.   Discussed importance of regular sleep/wake cycle. Discussed importance of consistent adherence to medications and given her hesitancy taking medication due to fear of interfering with sleep, can restart Prozac alone as WBT is more likely causative medication. Discussed plan to start at lower dose given recent non-adherence and vivid dreams. Expressed agreement with this plan; all questions/concerns addressed.  Visit Diagnosis:    ICD-10-CM   1. Moderate episode of recurrent major depressive disorder (HCC)  F33.1     2. Generalized anxiety disorder  F41.1       Past Psychiatric History:  Diagnoses: MDD, GAD Medication trials: Wellbutrin XL 150 mg Substance use: denies etoh, tobacco, or illicit drug use   Past Medical History:  Past Medical History:  Diagnosis Date   Anxiety    Depression    History reviewed. No pertinent surgical history.  Family Psychiatric History:  Sister - Depression, borderline personality   Family History:  Family History  Problem Relation Age of Onset   Diabetes Mother    Cirrhosis Mother     Social History:  Social History   Socioeconomic History   Marital status: Single    Spouse name: Not on file   Number  of children: Not on file   Years of education: Not on file   Highest education level: Not on file  Occupational History   Not on file  Tobacco Use   Smoking status: Never    Passive exposure: Yes    Smokeless tobacco: Never  Substance and Sexual Activity   Alcohol use: No   Drug use: No   Sexual activity: Not Currently  Other Topics Concern   Not on file  Social History Narrative   Not on file   Social Determinants of Health   Financial Resource Strain: Not on file  Food Insecurity: Not on file  Transportation Needs: Not on file  Physical Activity: Not on file  Stress: Not on file  Social Connections: Not on file    Allergies: No Known Allergies  Current Medications: Current Outpatient Medications  Medication Sig Dispense Refill   FLUoxetine (PROZAC) 20 MG capsule Take 1 capsule (20 mg total) by mouth daily. 30 capsule 2   No current facility-administered medications for this visit.    ROS: Endorses fatigue  Objective:  Psychiatric Specialty Exam: There were no vitals taken for this visit.There is no height or weight on file to calculate BMI.  General Appearance: Casual  Eye Contact:  Good  Speech:  Clear and Coherent and Normal Rate  Volume:  Normal  Mood:   "a bit more down"  Affect:   Euthymic; calm  Thought Content:  Denies AVH; IOR; no overt delusional content on interview    Suicidal Thoughts:  No  Homicidal Thoughts:  No  Thought Process:  Goal Directed and Linear  Orientation:  Full (Time, Place, and Person)    Memory:   Grossly intact  Judgment:  Good  Insight:  Good  Concentration:  Concentration: Good  Recall:  NA  Fund of Knowledge: Good  Language: Good  Psychomotor Activity:  Normal  Akathisia:  No  AIMS (if indicated): not done  Assets:  Communication Skills Desire for Improvement Housing Intimacy Physical Health Social Support Vocational/Educational  ADL's:  Intact  Cognition: WNL  Sleep:   dysregulated   PE: General: sits comfortably in view of camera; no acute distress  Pulm: no increased work of breathing on room air  MSK: all extremity movements appear intact  Neuro: no focal neurological deficits observed  Gait &  Station: unable to assess by video    Metabolic Disorder Labs: Lab Results  Component Value Date   HGBA1C 5.1 04/17/2021   MPG 99.67 04/17/2021   No results found for: "PROLACTIN" Lab Results  Component Value Date   CHOL 150 04/17/2021   TRIG 71 04/17/2021   HDL 38 (L) 04/17/2021   CHOLHDL 3.9 04/17/2021   VLDL 14 04/17/2021   LDLCALC 98 04/17/2021   Lab Results  Component Value Date   TSH 1.495 04/17/2021    Therapeutic Level Labs: No results found for: "LITHIUM" No results found for: "VALPROATE" No results found for: "CBMZ"  Screenings:  GAD-7    Flowsheet Row Video Visit from 09/23/2021 in [facility] Video Visit from 07/23/2021 in [facility] Counselor from 06/18/2021 in [facility] Office Visit from 04/22/2021 in [facility]  Total GAD-7 Score 12 16 0 18      PHQ2-9    Flowsheet Row Video Visit from 09/23/2021 in [facility] Counselor from 09/03/2021 in [facility] Video Visit from 07/23/2021 in [facility] Counselor from 06/18/2021 in [facility] Office Visit from 04/22/2021 in [facility]  PHQ-2 Total Score 1 0 4 2 4   PHQ-9 Total Score -- 0 14 9 19       Flowsheet Row Video Visit from 09/23/2021 in [facility] ED from 08/28/2021 in [facility] EMERGENCY DEPARTMENT Video Visit from 07/23/2021 in [facility]  C-SSRS RISK CATEGORY No Risk No Risk No Risk       Collaboration of Care: Collaboration of Care: Medication Management AEB ongoing medication management, Psychiatrist AEB established with this provider, and Other provider involved in patient's care AEB established with individual psychotherapy  Patient/Guardian was advised Release of  Information must be obtained prior to any record release in order to collaborate their care with an outside provider. Patient/Guardian was advised if they have not already done so to contact the registration department to sign all necessary forms in order for 07/25/2021 to release information regarding their care.   Consent: Patient/Guardian gives verbal consent for treatment and assignment of benefits for services provided during this visit. Patient/Guardian expressed understanding and agreed to proceed.   Televisit via video: I connected with patient on 03/15/22 at  3:30 PM EST by a video enabled telemedicine application and verified that I am speaking with the correct person using two identifiers.  Location: Patient: home address in [place] Provider: remote office in [place]   I discussed the limitations of evaluation and management by telemedicine and the availability of in person appointments. The patient expressed understanding and agreed to proceed.  I discussed the assessment and treatment plan with the patient. The patient was provided an opportunity to ask questions and all were answered. The patient agreed with the plan and demonstrated an understanding of the instructions.   The patient was advised to call back or seek an in-person evaluation if the symptoms worsen or if the condition fails to improve as anticipated.  I provided 35 minutes of non-face-to-face time during this encounter.  Justino Boze A Abhay Godbolt 03/15/2022, 4:20 PM

## 2022-03-21 ENCOUNTER — Other Ambulatory Visit: Payer: Self-pay

## 2022-03-21 DIAGNOSIS — J029 Acute pharyngitis, unspecified: Secondary | ICD-10-CM | POA: Diagnosis present

## 2022-03-21 DIAGNOSIS — J069 Acute upper respiratory infection, unspecified: Secondary | ICD-10-CM | POA: Diagnosis not present

## 2022-03-21 DIAGNOSIS — Z20822 Contact with and (suspected) exposure to covid-19: Secondary | ICD-10-CM | POA: Diagnosis not present

## 2022-03-21 LAB — GROUP A STREP BY PCR: Group A Strep by PCR: NOT DETECTED

## 2022-03-21 NOTE — ED Triage Notes (Signed)
Pt c/o sore throat, headache and nasal congestion that started yesterday.

## 2022-03-22 ENCOUNTER — Emergency Department
Admission: EM | Admit: 2022-03-22 | Discharge: 2022-03-22 | Disposition: A | Discharge status: Home / Self Care | Payer: Medicaid Other | Attending: Emergency Medicine | Admitting: Emergency Medicine

## 2022-03-22 DIAGNOSIS — J111 Influenza due to unidentified influenza virus with other respiratory manifestations: Secondary | ICD-10-CM

## 2022-03-22 DIAGNOSIS — J069 Acute upper respiratory infection, unspecified: Secondary | ICD-10-CM

## 2022-03-22 LAB — RESP PANEL BY RT-PCR (FLU A&B, COVID) ARPGX2
Influenza A by PCR: NEGATIVE
Influenza B by PCR: NEGATIVE
SARS Coronavirus 2 by RT PCR: NEGATIVE

## 2022-03-22 MED ORDER — HYDROCOD POLI-CHLORPHE POLI ER 10-8 MG/5ML PO SUER: 5.0000 mL | Freq: Two times a day (BID) | ORAL | 0 refills | Status: DC | PRN

## 2022-03-22 MED ORDER — IBUPROFEN 400 MG PO TABS
600.0000 mg | ORAL_TABLET | Freq: Once | ORAL | Status: AC
  Administered 2022-03-22: 600 mg via ORAL
  Filled 2022-03-22: qty 2

## 2022-03-22 MED ORDER — HYDROCOD POLI-CHLORPHE POLI ER 10-8 MG/5ML PO SUER
5.0000 mL | Freq: Once | ORAL | Status: AC
  Administered 2022-03-22: 5 mL via ORAL
  Filled 2022-03-22: qty 5

## 2022-03-22 NOTE — ED Provider Notes (Signed)
[facility] Provider Note    Event Date/Time   First MD Initiated Contact with Patient 03/22/22 0144     (approximate)   History   Headache and Nasal Congestion   HPI  Julie Rios is a 21 y.o. female whose medical history includes depression and anxiety as well as migraines.  She presents for evaluation of symptoms that been going on since yesterday and include sore throat, headache, nasal congestion, and "lumps" on her neck primarily on the right side.  The patient says she feels really bad and nothing seems to make it better.  No cough at this point, no shortness of breath.  No difficulty swallowing, but it hurts to do so.  No passing out, no chest pain, no nausea, no vomiting, no abdominal pain.     Physical Exam   Triage Vital Signs: ED Triage Vitals  Enc Vitals Group     BP 03/21/22 2315 (!) 133/93     Pulse Rate 03/21/22 2315 (!) 120     Resp 03/21/22 2315 18     Temp 03/21/22 2315 98.3 F (36.8 C)     Temp src --      SpO2 03/21/22 2315 94 %     Weight 03/21/22 2317 84 kg (185 lb 3 oz)     Height 03/21/22 2317 1.6 m (5\' 3" )     Head Circumference --      Peak Flow --      Pain Score 03/21/22 2317 7     Pain Loc --      Pain Edu? --      Excl. in GC? --     Most recent vital signs: Vitals:   03/21/22 2315 03/22/22 0203  BP: (!) 133/93   Pulse: (!) 120 (!) 105  Resp: 18 18  Temp: 98.3 F (36.8 C)   SpO2: 94% 96%     General: Awake, no distress.  Appears uncomfortable as if from a viral illness but is nontoxic in appearance. CV:  Good peripheral perfusion.  Mild tachycardia, initially higher in triage but down around 100 when I saw her.  Regular rate and rhythm. Resp:  Normal effort.  Lungs are clear to auscultation.  No wheezing.  No cough observed during my interview. Abd:  No distention.  No tenderness to palpation. Other:  Erythematous oropharynx but without exudate or petechiae.  No swelling in the throat to suggest a  peritonsillar abscess or odontogenic infection.  No Ludwig's angina.  Tolerating oral secretions, speaking easily and comfortably.  She has palpable submandibular and some cervical lymphadenopathy but it is minimally uncomfortable and she has no tenderness to manipulation of the larynx that might suggest epiglottitis.   ED Results / Procedures / Treatments   Labs (all labs ordered are listed, but only abnormal results are displayed) Labs Reviewed  GROUP A STREP BY PCR  RESP PANEL BY RT-PCR (FLU A&B, COVID) [id number]     PROCEDURES:  Critical Care performed: No  Procedures   MEDICATIONS ORDERED IN ED: Medications  chlorpheniramine-HYDROcodone (TUSSIONEX) 10-8 MG/5ML suspension 5 mL (5 mLs Oral Given 03/22/22 0158)  ibuprofen (ADVIL) tablet 600 mg (600 mg Oral Given 03/22/22 0158)     IMPRESSION / MDM / ASSESSMENT AND PLAN / ED COURSE  I reviewed the triage vital signs and the nursing notes.  Differential diagnosis includes, but is not limited to, nonspecific viral illness, influenza, COVID-19, bacterial pharyngitis such as strep, viral pharyngitis, pneumonia.  Patient's presentation is most consistent with acute complicated illness / injury requiring diagnostic workup.  Mild tachycardia, otherwise stable vital signs.  Patient appears uncomfortable but is nontoxic.  Symptoms are strongly suggestive of viral upper respiratory infection.  She is having no difficulty breathing, no no abnormal breath sounds on exam, no cough.  No indication for chest x-ray.  Labs/studies ordered include respiratory viral panel and group A strep by PCR.  These tests were all negative.  I talked with her and her mother about the probability of a nonspecific viral illness and they understand and agree.  There is no indication for antibiotics at this time.  I provided a prescription for cough syrup with hydrocodone both for comfort and anticipating she will likely develop a cough  as the respiratory infection starts to involve her lungs.  I encouraged close outpatient follow-up and I gave my usual and customary return precautions.  The patient and her mother understand and agree with the plan.         FINAL CLINICAL IMPRESSION(S) / ED DIAGNOSES   Final diagnoses:  Viral URI  Influenza-like illness     Rx / DC Orders   ED Discharge Orders          Ordered    chlorpheniramine-HYDROcodone (TUSSIONEX) 10-8 MG/5ML  Every 12 hours PRN        03/22/22 0203             Note:  This document was prepared using Dragon voice recognition software and may include unintentional dictation errors.   Loleta Rose, MD 03/22/22 [phone]

## 2022-03-22 NOTE — ED Notes (Signed)
ED Provider at bedside. 

## 2022-03-22 NOTE — Discharge Instructions (Signed)

## 2022-03-22 NOTE — ED Notes (Signed)
Signing pad did not work, pt verbalized understanding of DC instructions. 

## 2022-03-23 ENCOUNTER — Ambulatory Visit (HOSPITAL_COMMUNITY): Payer: Medicaid Other | Admitting: Clinical

## 2022-04-06 ENCOUNTER — Ambulatory Visit (HOSPITAL_COMMUNITY): Payer: Medicaid Other | Admitting: Clinical

## 2022-05-06 ENCOUNTER — Encounter (HOSPITAL_COMMUNITY): Payer: Self-pay

## 2022-05-06 ENCOUNTER — Encounter (HOSPITAL_COMMUNITY): Payer: Medicaid Other | Admitting: Psychiatry

## 2022-05-06 NOTE — Progress Notes (Signed)
Patient did not connect for virtual psychiatric medication management appointment on 05/06/22 at 1:30PM. Sent secure video link with no response; left VM with callback number to reschedule.  Daine Gip, MD 05/06/22

## 2022-05-10 NOTE — L&D Delivery Note (Cosign Needed Addendum)
Delivery Note Julie Rios is a 22 y.o. G1P0 at [redacted]w[redacted]d admitted for IOL for GDMA1 and cholestasis with elevated bile acids.   GBS Status: Negative/-- (11/27 1420) Maximum Maternal Temperature: 98.6  Labor course: Initial SVE: 1/20/-3. Augmentation with:  Cooks balloon and AROM at 0535 . She then progressed to complete.  ROM: 8h 49m with clear/pink fluid  Birth: At 1337 a viable female was delivered via spontaneous vaginal delivery (Presentation: LOA). Nuchal cord present: Yes.  Shoulders and body delivered in usual fashion. Infant placed directly on mom's abdomen for bonding/skin-to-skin, baby dried and stimulated. Cord clamped x 2 after 1 minute and cut by FOB.  Cord blood collected.  The placenta separated spontaneously and delivered via gentle cord traction.  Pitocin infused rapidly IV per protocol.  Fundus firm with massage.  Placenta inspected and appears to be intact with a 3 VC.   Sponge and instrument count were correct x2.  Intrapartum complications:  Gestational Diabetes, diet controlled and ICP Anesthesia:  epidural Episiotomy: none Lacerations:  2nd degree perineal and bilateral labial  Suture Repair: 3.0 EBL (mL): 220  Infant: APGAR (1 MIN): 8  APGAR (5 MINS): 9   Infant weight: 2700g  Mom to postpartum.  Baby to Couplet care / Skin to Skin. Placenta to Home with patient   Plans to Breastfeed Contraception:  undecided Circumcision: N/A  Note sent to [facility]: [facility] for pp visit.  Gwenlyn Perking, MD 04/18/2023 2:36 PM   GME ATTESTATION:  Evaluation and management procedures were performed by the [facility] Resident under my supervision. I was immediately available and gloved for direct supervision, assistance and direction throughout this encounter.  I also confirm that I have verified the information documented in the resident's note, and that I have also personally reperformed the pertinent components of the physical exam and all of the medical decision making  activities.  I have also made any necessary editorial changes.  Wyn Forster, MD OB Fellow, Faculty Practice [facility], [facility] 04/18/2023 5:38 PM

## 2022-07-04 ENCOUNTER — Other Ambulatory Visit (HOSPITAL_COMMUNITY): Payer: Self-pay | Admitting: Psychiatry

## 2022-09-22 ENCOUNTER — Encounter (HOSPITAL_COMMUNITY): Payer: Self-pay | Admitting: *Deleted

## 2022-09-22 ENCOUNTER — Inpatient Hospital Stay (HOSPITAL_COMMUNITY)
Admission: AD | Admit: 2022-09-22 | Discharge: 2022-09-22 | Disposition: A | Discharge status: Home / Self Care | Payer: Medicaid Other | Attending: Obstetrics & Gynecology | Admitting: Obstetrics & Gynecology

## 2022-09-22 ENCOUNTER — Other Ambulatory Visit: Payer: Self-pay

## 2022-09-22 DIAGNOSIS — O210 Mild hyperemesis gravidarum: Secondary | ICD-10-CM | POA: Diagnosis present

## 2022-09-22 DIAGNOSIS — O219 Vomiting of pregnancy, unspecified: Secondary | ICD-10-CM | POA: Diagnosis not present

## 2022-09-22 DIAGNOSIS — A084 Viral intestinal infection, unspecified: Secondary | ICD-10-CM

## 2022-09-22 DIAGNOSIS — Z3A08 8 weeks gestation of pregnancy: Secondary | ICD-10-CM | POA: Insufficient documentation

## 2022-09-22 LAB — URINALYSIS, ROUTINE W REFLEX MICROSCOPIC
Bilirubin Urine: NEGATIVE
Glucose, UA: NEGATIVE mg/dL
Hgb urine dipstick: NEGATIVE
Ketones, ur: 20 mg/dL — AB
Nitrite: NEGATIVE
Protein, ur: 30 mg/dL — AB
Specific Gravity, Urine: 1.017 (ref 1.005–1.030)
Squamous Epithelial / HPF: >50 /HPF (ref 0–5)
pH: 5 (ref 5.0–8.0)

## 2022-09-22 MED ORDER — SCOPOLAMINE 1 MG/3DAYS TD PT72: 1.0000 | MEDICATED_PATCH | TRANSDERMAL | Status: DC

## 2022-09-22 MED ORDER — SCOPOLAMINE 1 MG/3DAYS TD PT72: 1.0000 | MEDICATED_PATCH | TRANSDERMAL | 1 refills | Status: DC

## 2022-09-22 MED ORDER — ONDANSETRON 4 MG PO TBDP
4.0000 mg | ORAL_TABLET | Freq: Once | ORAL | Status: AC
  Administered 2022-09-22: 4 mg via ORAL
  Filled 2022-09-22: qty 1

## 2022-09-22 MED ORDER — ONDANSETRON 4 MG PO TBDP: 4.0000 mg | ORAL_TABLET | Freq: Four times a day (QID) | ORAL | 2 refills | Status: DC | PRN

## 2022-09-22 MED ORDER — PROMETHAZINE HCL 25 MG RE SUPP: 25.0000 mg | Freq: Four times a day (QID) | RECTAL | 2 refills | Status: DC | PRN

## 2022-09-22 MED ORDER — PROMETHAZINE HCL 25 MG PO TABS: 25.0000 mg | ORAL_TABLET | Freq: Four times a day (QID) | ORAL | 2 refills | Status: DC | PRN

## 2022-09-22 NOTE — MAU Provider Note (Signed)
History     [id number]  Arrival date and time: 09/22/22 1040    Chief Complaint  Patient presents with  . Nausea  . Emesis     HPI Julie Rios is a 22 y.o. at [redacted]w[redacted]d by 6 wk [place] with PMHx notable for anxiety/depression, who presents for nausea and vomiting.   Patient reports she is worried she has a stomach bug About 5 days ago a 22 year old in her household came down with stomach bug symptoms and was very ill She developed systems about two days ago and has had a very hard time keeping down food or water She has been taking PO zofran (she does not have an ODT prescription) which has been so so for controlling her symptoms She has had some cramping intermittently in her abdomen but none right now No diarrhea Denies vaginal bleeding or leaking of fluid      OB History     Gravida  1   Para      Term      Preterm      AB      Living         SAB      IAB      Ectopic      Multiple      Live Births              Past Medical History:  Diagnosis Date  . Anxiety   . Depression     No past surgical history on file.  Family History  Problem Relation Age of Onset  . Diabetes Mother   . Cirrhosis Mother     Social History   Socioeconomic History  . Marital status: Single    Spouse name: Not on file  . Number of children: Not on file  . Years of education: Not on file  . Highest education level: Not on file  Occupational History  . Not on file  Tobacco Use  . Smoking status: Never    Passive exposure: Yes  . Smokeless tobacco: Never  Substance and Sexual Activity  . Alcohol use: No  . Drug use: No  . Sexual activity: Not Currently  Other Topics Concern  . Not on file  Social History Narrative  . Not on file   Social Determinants of Health   Financial Resource Strain: Not on file  Food Insecurity: Not on file  Transportation Needs: Not on file  Physical Activity: Not on file  Stress: Not on file  Social Connections: Not on file   Intimate Partner Violence: Not on file    No Known Allergies  No current facility-administered medications on file prior to encounter.   Current Outpatient Medications on File Prior to Encounter  Medication Sig Dispense Refill  . chlorpheniramine-HYDROcodone (TUSSIONEX) 10-8 MG/5ML Take 5 mLs by mouth every 12 (twelve) hours as needed for cough. 115 mL 0  . FLUoxetine (PROZAC) 20 MG capsule TAKE 1 CAPSULE BY MOUTH EVERY DAY 30 capsule 2     ROS Pertinent positives and negative per HPI, all others reviewed and negative  Physical Exam   BP 116/81 (BP Location: Right Arm)   Pulse (!) 110   Temp 97.7 F (36.5 C) (Oral)   Resp 18   Ht 5\' 4"  (1.626 m)   Wt 82.5 kg   LMP 07/24/2022   SpO2 98%   BMI 31.22 kg/m   Patient Vitals for the past 24 hrs:  BP Temp Temp src Pulse Resp  SpO2 Height Weight  09/22/22 1102 116/81 97.7 F (36.5 C) Oral (!) 110 18 98 % -- --  09/22/22 1056 -- -- -- -- -- -- 5\' 4"  (1.626 m) 82.5 kg    Physical Exam Vitals reviewed.  Constitutional:      General: She is not in acute distress.    Appearance: She is well-developed. She is not diaphoretic.  Eyes:     General: No scleral icterus. Pulmonary:     Effort: Pulmonary effort is normal. No respiratory distress.  Skin:    General: Skin is warm and dry.  Neurological:     Mental Status: She is alert.     Coordination: Coordination normal.     Cervical Exam    Bedside Ultrasound Not performed.  My interpretation: n/a  FHT Not done  Labs Results for orders placed or performed during the hospital encounter of 09/22/22 (from the past 24 hour(s))  Urinalysis, Routine w reflex microscopic -Urine, Clean Catch     Status: Abnormal   Collection Time: 09/22/22 10:52 AM  Result Value Ref Range   Color, Urine AMBER (A) YELLOW   APPearance CLOUDY (A) CLEAR   Specific Gravity, Urine 1.017 1.005 - 1.030   pH 5.0 5.0 - 8.0   Glucose, UA NEGATIVE NEGATIVE mg/dL   Hgb urine dipstick NEGATIVE  NEGATIVE   Bilirubin Urine NEGATIVE NEGATIVE   Ketones, ur 20 (A) NEGATIVE mg/dL   Protein, ur 30 (A) NEGATIVE mg/dL   Nitrite NEGATIVE NEGATIVE   Leukocytes,Ua MODERATE (A) NEGATIVE   RBC / HPF 11-20 0 - 5 RBC/hpf   WBC, UA 21-50 0 - 5 WBC/hpf   Bacteria, UA MANY (A) NONE SEEN   Squamous Epithelial / HPF >50 0 - 5 /HPF   Mucus PRESENT     Imaging No results found.  MAU Course  Procedures Lab Orders         Urinalysis, Routine w reflex microscopic -Urine, Clean Catch    Meds ordered this encounter  Medications  . ondansetron (ZOFRAN-ODT) disintegrating tablet 4 mg  . ondansetron (ZOFRAN-ODT) 4 MG disintegrating tablet    Sig: Take 1 tablet (4 mg total) by mouth every 6 (six) hours as needed for nausea.    Dispense:  20 tablet    Refill:  2  . promethazine (PHENERGAN) 25 MG tablet    Sig: Take 1 tablet (25 mg total) by mouth every 6 (six) hours as needed for nausea or vomiting.    Dispense:  30 tablet    Refill:  2  . promethazine (PHENERGAN) 25 MG suppository    Sig: Place 1 suppository (25 mg total) rectally every 6 (six) hours as needed for nausea or vomiting.    Dispense:  12 each    Refill:  2  . scopolamine (TRANSDERM-SCOP) 1 MG/3DAYS 1.5 mg  . scopolamine (TRANSDERM-SCOP) 1 MG/3DAYS    Sig: Place 1 patch (1.5 mg total) onto the skin every 3 (three) days.    Dispense:  10 patch    Refill:  1   Imaging Orders  No imaging studies ordered today    MDM Moderate (Level 3-4)  Assessment and Plan  #Nausea and vomiting of pregnancy #?Viral gastroenteritis? #[redacted] weeks gestation of pregnancy N/v of pregnancy vs viral gastro, given ODT zofran and able to pass PO challenge. Given rx for ODT zofran, phenergan tablets and suppositories, and scopolamine patches.     Dispo: discharged to home in stable condition    Venora Maples, MD/MPH 09/22/22  12:39 PM  Allergies as of 09/22/2022   No Known Allergies      Medication List     STOP taking these  medications    chlorpheniramine-HYDROcodone 10-8 MG/5ML Commonly known as: TUSSIONEX       TAKE these medications    FLUoxetine 20 MG capsule Commonly known as: PROZAC TAKE 1 CAPSULE BY MOUTH EVERY DAY   ondansetron 4 MG disintegrating tablet Commonly known as: ZOFRAN-ODT Take 1 tablet (4 mg total) by mouth every 6 (six) hours as needed for nausea.   promethazine 25 MG tablet Commonly known as: PHENERGAN Take 1 tablet (25 mg total) by mouth every 6 (six) hours as needed for nausea or vomiting.   promethazine 25 MG suppository Commonly known as: PHENERGAN Place 1 suppository (25 mg total) rectally every 6 (six) hours as needed for nausea or vomiting.   scopolamine 1 MG/3DAYS Commonly known as: TRANSDERM-SCOP Place 1 patch (1.5 mg total) onto the skin every 3 (three) days.

## 2022-09-22 NOTE — MAU Note (Signed)
Julie Rios is a 22 y.o. at Unknown here in MAU reporting: N/V and fatigue that began 2 days ago.  Reports hasn't been able to keep anything down including fluids.  States has been in contact with someone with a "stomach bug".   LMP: 07/27/2022 Onset of complaint: 2 days ago Pain score: 0 Vitals:   09/22/22 1102  BP: 116/81  Pulse: (!) 110  Resp: 18  Temp: 97.7 F (36.5 C)  SpO2: 98%     FHT:NA Lab orders placed from triage:   UA

## 2022-10-06 ENCOUNTER — Ambulatory Visit (INDEPENDENT_AMBULATORY_CARE_PROVIDER_SITE_OTHER): Payer: Medicaid Other | Admitting: *Deleted

## 2022-10-06 ENCOUNTER — Other Ambulatory Visit (INDEPENDENT_AMBULATORY_CARE_PROVIDER_SITE_OTHER): Payer: Medicaid Other

## 2022-10-06 VITALS — BP 115/74 | HR 82 | Wt 176.0 lb

## 2022-10-06 DIAGNOSIS — Z6791 Unspecified blood type, Rh negative: Secondary | ICD-10-CM

## 2022-10-06 DIAGNOSIS — Z3400 Encounter for supervision of normal first pregnancy, unspecified trimester: Secondary | ICD-10-CM | POA: Insufficient documentation

## 2022-10-06 DIAGNOSIS — Z3401 Encounter for supervision of normal first pregnancy, first trimester: Secondary | ICD-10-CM

## 2022-10-06 DIAGNOSIS — Z3A10 10 weeks gestation of pregnancy: Secondary | ICD-10-CM

## 2022-10-06 DIAGNOSIS — R8271 Bacteriuria: Secondary | ICD-10-CM

## 2022-10-06 DIAGNOSIS — O219 Vomiting of pregnancy, unspecified: Secondary | ICD-10-CM | POA: Insufficient documentation

## 2022-10-06 MED ORDER — SCOPOLAMINE 1 MG/3DAYS TD PT72: 1.0000 | MEDICATED_PATCH | TRANSDERMAL | 12 refills | Status: DC

## 2022-10-06 MED ORDER — FAMOTIDINE 20 MG PO TABS: 20.0000 mg | ORAL_TABLET | Freq: Two times a day (BID) | ORAL | 3 refills | Status: DC

## 2022-10-06 NOTE — Progress Notes (Signed)
New OB Intake  I explained I am completing New OB Intake today. We discussed her EDD of 04/30/23 that is based on LMP. LMP of 07/24/22. Pt is G1/P0. I reviewed her allergies, medications, Medical/Surgical/OB history, and appropriate screenings.   Patient Active Problem List   Diagnosis Date Noted   Encounter for supervision of normal first pregnancy in first trimester 10/06/2022   Nausea and vomiting in pregnancy 10/06/2022   Moderate episode of recurrent major depressive disorder (HCC) 04/22/2021   Generalized anxiety disorder 04/22/2021   MDD (major depressive disorder), recurrent severe, without psychosis (HCC) 04/17/2021   Suicidal ideation     Concerns addressed today  Delivery Plans:  Plans to deliver at [facility].   MyChart/Babyscripts MyChart access verified. I explained pt will have some visits in office and some virtually. Babyscripts app discussed and ordered.   Blood Pressure Cuff  BP cuff discussed and given  Discussed to be used for virtual visits and or if needed BP checks weekly.    Anatomy US Explained first scheduled [place] will be around 19 weeks.   Labs Discussed Avelina Laine genetic screening with patient.   Routine prenatal labs collected today.   Placed OB Box on problem list and updated   Patient informed that the ultrasound is considered a limited obstetric ultrasound and is not intended to be a complete ultrasound exam.  Patient also informed that the ultrasound is not being completed with the intent of assessing for fetal or placental anomalies or any pelvic abnormalities. Explained that the purpose of today's ultrasound is to assess for dating and fetal heart rate.  Patient acknowledges the purpose of the exam and the limitations of the study.      First visit review I reviewed new OB appt with pt. I explained she will have a pap smear, and possible genetic screening. Explained pt will be seen by Dr Shawnie Pons at first visit.    Scheryl Marten, RN 10/06/2022   11:01 AM

## 2022-10-07 DIAGNOSIS — Z6791 Unspecified blood type, Rh negative: Secondary | ICD-10-CM | POA: Insufficient documentation

## 2022-10-07 LAB — CBC/D/PLT+RPR+RH+ABO+RUBIGG...
Antibody Screen: NEGATIVE
Basophils Absolute: 0 10*3/uL (ref 0.0–0.2)
Basos: 0 %
EOS (ABSOLUTE): 0.1 10*3/uL (ref 0.0–0.4)
Eos: 1 %
HCV Ab: NONREACTIVE
HIV Screen 4th Generation wRfx: NONREACTIVE
Hematocrit: 45.2 % (ref 34.0–46.6)
Hemoglobin: 14.9 g/dL (ref 11.1–15.9)
Hepatitis B Surface Ag: NEGATIVE
Immature Grans (Abs): 0 10*3/uL (ref 0.0–0.1)
Immature Granulocytes: 0 %
Lymphocytes Absolute: 2.5 10*3/uL (ref 0.7–3.1)
Lymphs: 18 %
MCH: 28.7 pg (ref 26.6–33.0)
MCHC: 33 g/dL (ref 31.5–35.7)
MCV: 87 fL (ref 79–97)
Monocytes Absolute: 0.6 10*3/uL (ref 0.1–0.9)
Monocytes: 5 %
Neutrophils Absolute: 10.4 10*3/uL — ABNORMAL HIGH (ref 1.4–7.0)
Neutrophils: 76 %
Platelets: 400 10*3/uL (ref 150–450)
RBC: 5.19 x10E6/uL (ref 3.77–5.28)
RDW: 13.5 % (ref 11.7–15.4)
RPR Ser Ql: NONREACTIVE
Rh Factor: NEGATIVE
Rubella Antibodies, IGG: 1.38 index (ref >0.99)
WBC: 13.7 10*3/uL — ABNORMAL HIGH (ref 3.4–10.8)

## 2022-10-07 LAB — HEMOGLOBIN A1C
Est. average glucose Bld gHb Est-mCnc: 100 mg/dL
Hgb A1c MFr Bld: 5.1 % (ref 4.8–5.6)

## 2022-10-07 LAB — HCV INTERPRETATION

## 2022-10-07 LAB — T4F: T4,Free (Direct): 1.18 ng/dL (ref 0.82–1.77)

## 2022-10-07 LAB — TSH RFX ON ABNORMAL TO FREE T4: TSH: 0.424 u[IU]/mL — ABNORMAL LOW (ref 0.450–4.500)

## 2022-10-11 LAB — URINE CULTURE, OB REFLEX

## 2022-10-11 LAB — CULTURE, OB URINE

## 2022-10-12 ENCOUNTER — Other Ambulatory Visit: Payer: Self-pay | Admitting: *Deleted

## 2022-10-12 DIAGNOSIS — R8271 Bacteriuria: Secondary | ICD-10-CM | POA: Insufficient documentation

## 2022-10-12 MED ORDER — AMOXICILLIN 500 MG PO CAPS: 500.0000 mg | ORAL_CAPSULE | Freq: Three times a day (TID) | ORAL | 0 refills | Status: AC

## 2022-10-12 NOTE — Addendum Note (Signed)
Addended by: Reva Bores on: 10/12/2022 09:25 AM   Modules accepted: Orders

## 2022-10-12 NOTE — Progress Notes (Signed)
Erroneous

## 2022-10-13 ENCOUNTER — Other Ambulatory Visit: Payer: Self-pay | Admitting: Family Medicine

## 2022-10-17 ENCOUNTER — Encounter: Payer: Self-pay | Admitting: Obstetrics and Gynecology

## 2022-10-27 ENCOUNTER — Ambulatory Visit (INDEPENDENT_AMBULATORY_CARE_PROVIDER_SITE_OTHER): Payer: Medicaid Other | Admitting: Family Medicine

## 2022-10-27 ENCOUNTER — Encounter: Payer: Self-pay | Admitting: Family Medicine

## 2022-10-27 ENCOUNTER — Other Ambulatory Visit (HOSPITAL_COMMUNITY)
Admission: RE | Admit: 2022-10-27 | Discharge: 2022-10-27 | Disposition: A | Discharge status: Home / Self Care | Payer: Medicaid Other | Source: Ambulatory Visit | Attending: Family Medicine | Admitting: Family Medicine

## 2022-10-27 VITALS — BP 120/83 | HR 94 | Wt 175.0 lb

## 2022-10-27 DIAGNOSIS — Z6791 Unspecified blood type, Rh negative: Secondary | ICD-10-CM

## 2022-10-27 DIAGNOSIS — O26891 Other specified pregnancy related conditions, first trimester: Secondary | ICD-10-CM | POA: Diagnosis not present

## 2022-10-27 DIAGNOSIS — R8271 Bacteriuria: Secondary | ICD-10-CM

## 2022-10-27 DIAGNOSIS — Z3A13 13 weeks gestation of pregnancy: Secondary | ICD-10-CM | POA: Diagnosis not present

## 2022-10-27 DIAGNOSIS — Z124 Encounter for screening for malignant neoplasm of cervix: Secondary | ICD-10-CM | POA: Diagnosis not present

## 2022-10-27 DIAGNOSIS — Z113 Encounter for screening for infections with a predominantly sexual mode of transmission: Secondary | ICD-10-CM | POA: Insufficient documentation

## 2022-10-27 DIAGNOSIS — O99891 Other specified diseases and conditions complicating pregnancy: Secondary | ICD-10-CM | POA: Diagnosis not present

## 2022-10-27 DIAGNOSIS — Z1339 Encounter for screening examination for other mental health and behavioral disorders: Secondary | ICD-10-CM | POA: Diagnosis not present

## 2022-10-27 DIAGNOSIS — Z3401 Encounter for supervision of normal first pregnancy, first trimester: Secondary | ICD-10-CM

## 2022-10-27 MED ORDER — PRENATAL VITAMIN 27-0.8 MG PO TABS: 1.0000 | ORAL_TABLET | Freq: Every day | ORAL | 0 refills | Status: DC

## 2022-10-27 NOTE — Progress Notes (Signed)
NOB   NOB labs drawn at intake.  Pap - Today   Genetic Screening:Desires and planning gender reveal wants in envelope once.  Notes nausea is improving.

## 2022-10-27 NOTE — Progress Notes (Signed)
Subjective:   Julie Rios is a 22 y.o. G1P0 at [redacted]w[redacted]d by LMP, early ultrasound being seen today for her first obstetrical visit.  Her obstetrical history is not significant. Patient does intend to breast feed. Pregnancy history fully reviewed.  Patient reports no complaints.  HISTORY: OB History  Gravida Para Term Preterm AB Living  1 0 0 0 0 0  SAB IAB Ectopic Multiple Live Births  0 0 0 0 0    # Outcome Date GA Lbr Len/2nd Weight Sex Delivery Anes PTL Lv  1 Current            Past Medical History:  Diagnosis Date   Anxiety    Depression    MDD (major depressive disorder), single episode, moderate (HCC)    Past Surgical History:  Procedure Laterality Date   NO PAST SURGERIES     Family History  Problem Relation Age of Onset   Diabetes Mother    Cirrhosis Mother    Diabetes Father    Social History   Tobacco Use   Smoking status: Never    Passive exposure: Yes   Smokeless tobacco: Never  Vaping Use   Vaping Use: Some days   Substances: Nicotine  Substance Use Topics   Alcohol use: No   Drug use: No   No Known Allergies Current Outpatient Medications on File Prior to Visit  Medication Sig Dispense Refill   famotidine (PEPCID) 20 MG tablet Take 1 tablet (20 mg total) by mouth 2 (two) times daily. 60 tablet 3   ondansetron (ZOFRAN-ODT) 4 MG disintegrating tablet Take 1 tablet (4 mg total) by mouth every 6 (six) hours as needed for nausea. 20 tablet 2   promethazine (PHENERGAN) 25 MG suppository Place 1 suppository (25 mg total) rectally every 6 (six) hours as needed for nausea or vomiting. 12 each 2   No current facility-administered medications on file prior to visit.     Exam   Vitals:   10/27/22 1018  BP: 120/83  Pulse: 94  Weight: 175 lb (79.4 kg)      Uterus:     Pelvic Exam: Perineum: no hemorrhoids, normal perineum   Vulva: normal external genitalia, no lesions   Vagina:  normal mucosa, normal discharge   Cervix: no lesions and normal,  pap smear done.    Adnexa: normal adnexa and no mass, fullness, tenderness   Bony Pelvis: average  System: General: well-developed, well-nourished female in no acute distress   Breast:  normal appearance, no masses or tenderness   Skin: normal coloration and turgor, no rashes   Neurologic: oriented, normal, negative, normal mood   Extremities: normal strength, tone, and muscle mass, ROM of all joints is normal   HEENT extraocular movement intact and sclera clear, anicteric   Mouth/Teeth mucous membranes moist, pharynx normal without lesions and dental hygiene good   Neck supple and no masses   Cardiovascular: regular rate and rhythm   Respiratory:  no respiratory distress, normal breath sounds   Abdomen: soft, non-tender; bowel sounds normal; no masses,  no organomegaly     Assessment:   Pregnancy: G1P0 Patient Active Problem List   Diagnosis Date Noted   Asymptomatic bacteriuria during pregnancy in first trimester 10/12/2022   Rh negative state in antepartum period 10/07/2022   Encounter for supervision of normal first pregnancy in first trimester 10/06/2022   Nausea and vomiting in pregnancy 10/06/2022   Generalized anxiety disorder 04/22/2021   MDD (major depressive disorder), recurrent  severe, without psychosis (HCC) 04/17/2021   Suicidal ideation      Plan:  1. Rh negative state in antepartum period Discussed implications Rhogam @ 28 weeks and pp if indicated  2. Encounter for supervision of normal first pregnancy in first trimester - PANORAMA PRENATAL TEST - [facility] Basic Panel - Prenatal Vit-Fe Fumarate-FA (PRENATAL VITAMIN) 27-0.8 MG TABS; Take 1 tablet by mouth daily.  Dispense: 30 tablet; Refill: 0 - [place] MFM OB DETAIL +14 WK; Future  3. Asymptomatic bacteriuria during pregnancy in first trimester Enterococcus - treated  4. Screen for cervical cancer - Cytology - PAP   Initial labs drawn. Continue prenatal vitamins. Genetic Screening discussed, NIPS:  ordered. Ultrasound discussed; fetal anatomic survey: ordered. Problem list reviewed and updated.  Routine obstetric precautions reviewed. No follow-ups on file.

## 2022-10-30 ENCOUNTER — Emergency Department
Admission: EM | Admit: 2022-10-30 | Discharge: 2022-10-31 | Disposition: A | Discharge status: Home / Self Care | Payer: Medicaid Other | Attending: Emergency Medicine | Admitting: Emergency Medicine

## 2022-10-30 ENCOUNTER — Other Ambulatory Visit: Payer: Self-pay

## 2022-10-30 DIAGNOSIS — R102 Pelvic and perineal pain: Secondary | ICD-10-CM | POA: Diagnosis not present

## 2022-10-30 DIAGNOSIS — O4690 Antepartum hemorrhage, unspecified, unspecified trimester: Secondary | ICD-10-CM

## 2022-10-30 DIAGNOSIS — O209 Hemorrhage in early pregnancy, unspecified: Secondary | ICD-10-CM | POA: Insufficient documentation

## 2022-10-30 DIAGNOSIS — Z2914 Encounter for prophylactic rabies immune globin: Secondary | ICD-10-CM | POA: Insufficient documentation

## 2022-10-30 DIAGNOSIS — Z3A13 13 weeks gestation of pregnancy: Secondary | ICD-10-CM | POA: Insufficient documentation

## 2022-10-30 LAB — COMPREHENSIVE METABOLIC PANEL
ALT: 34 U/L (ref 0–44)
AST: 23 U/L (ref 15–41)
Albumin: 4.1 g/dL (ref 3.5–5.0)
Alkaline Phosphatase: 47 U/L (ref 38–126)
Anion gap: 10 (ref 5–15)
BUN: <5 mg/dL — ABNORMAL LOW (ref 6–20)
CO2: 17 mmol/L — ABNORMAL LOW (ref 22–32)
Calcium: 8.7 mg/dL — ABNORMAL LOW (ref 8.9–10.3)
Chloride: 102 mmol/L (ref 98–111)
Creatinine, Ser: 0.42 mg/dL — ABNORMAL LOW (ref 0.44–1.00)
GFR, Estimated: >60 mL/min (ref >60)
Glucose, Bld: 87 mg/dL (ref 70–99)
Potassium: 3.1 mmol/L — ABNORMAL LOW (ref 3.5–5.1)
Sodium: 129 mmol/L — ABNORMAL LOW (ref 135–145)
Total Bilirubin: 0.6 mg/dL (ref 0.3–1.2)
Total Protein: 7.4 g/dL (ref 6.5–8.1)

## 2022-10-30 LAB — CBC
HCT: 40.5 % (ref 36.0–46.0)
Hemoglobin: 14 g/dL (ref 12.0–15.0)
MCH: 29.7 pg (ref 26.0–34.0)
MCHC: 34.6 g/dL (ref 30.0–36.0)
MCV: 85.8 fL (ref 80.0–100.0)
Platelets: 345 10*3/uL (ref 150–400)
RBC: 4.72 MIL/uL (ref 3.87–5.11)
RDW: 13.6 % (ref 11.5–15.5)
WBC: 12.7 10*3/uL — ABNORMAL HIGH (ref 4.0–10.5)
nRBC: 0 % (ref 0.0–0.2)

## 2022-10-30 LAB — HCG, QUANTITATIVE, PREGNANCY: hCG, Beta Chain, Quant, S: 82784 m[IU]/mL — ABNORMAL HIGH (ref <5)

## 2022-10-30 NOTE — ED Triage Notes (Addendum)
Pt to ed from home via POV for vaginal bleeding and abdominal cramping. EDD 12/21. First pregnancy. OB is [facility] in [place].  Pt is CAOx4, in no acute distress in triage. Pt does have pics, and looks like very light spotting and bright red blood when she wiped. Started at 2pm today.

## 2022-10-31 ENCOUNTER — Emergency Department: Payer: Medicaid Other

## 2022-10-31 LAB — ANTIBODY SCREEN: Antibody Screen: NEGATIVE

## 2022-10-31 MED ORDER — RHO D IMMUNE GLOBULIN 1500 UNIT/2ML IJ SOSY
300.0000 ug | PREFILLED_SYRINGE | Freq: Once | INTRAMUSCULAR | Status: AC
  Administered 2022-10-31: 300 ug via INTRAMUSCULAR
  Filled 2022-10-31: qty 2

## 2022-10-31 NOTE — ED Provider Notes (Signed)
[facility] Provider Note    Event Date/Time   First MD Initiated Contact with Patient 10/31/22 0015     (approximate)   History   Chief Complaint: Vaginal Bleeding (Pregnant [redacted] weeks)   HPI  Julie Rios is a 22 y.o. female with a past history of depression and currently pregnant, G1, P0, 14 weeks by dates, who complains of vaginal bleeding and pelvic cramping pain that started today at about 1:00 PM.  It resolved at 7:00 PM and she has not had any further bleeding.  No fever or chills, no chest pain or shortness of breath.  No dysuria or abnormal vaginal discharge.  She has established with prenatal care.  Review of outside records shows she had ultrasound Oct 06, 2022 which confirmed IUP as well as fetal size consistent with dates.     Physical Exam   Triage Vital Signs: ED Triage Vitals  Enc Vitals Group     BP 10/30/22 2046 130/88     Pulse Rate 10/30/22 2046 93     Resp 10/30/22 2046 16     Temp 10/30/22 2046 98 F (36.7 C)     Temp Source 10/30/22 2046 Oral     SpO2 10/30/22 2046 98 %     Weight 10/30/22 2047 175 lb (79.4 kg)     Height 10/30/22 2047 5\' 4"  (1.626 m)     Head Circumference --      Peak Flow --      Pain Score 10/30/22 2047 3     Pain Loc --      Pain Edu? --      Excl. in GC? --     Most recent vital signs: Vitals:   10/31/22 0455 10/31/22 0457  BP:    Pulse: 83   Resp:    Temp:  98.2 F (36.8 C)  SpO2:      General: Awake, no distress.  CV:  Good peripheral perfusion.  Resp:  Normal effort.  Abd:  No distention.  Soft nontender    ED Results / Procedures / Treatments   Labs (all labs ordered are listed, but only abnormal results are displayed) Labs Reviewed  CBC - Abnormal; Notable for the following components:      Result Value   WBC 12.7 (*)    All other components within normal limits  COMPREHENSIVE METABOLIC PANEL - Abnormal; Notable for the following components:   Sodium 129 (*)    Potassium  3.1 (*)    CO2 17 (*)    BUN <5 (*)    Creatinine, Ser 0.42 (*)    Calcium 8.7 (*)    All other components within normal limits  HCG, QUANTITATIVE, PREGNANCY - Abnormal; Notable for the following components:   hCG, Beta Chain, Quant, S 29,562 (*)    All other components within normal limits  ABO/RH  ANTIBODY SCREEN  RHOGAM INJECTION     EKG    RADIOLOGY Ultrasound pelvis interpreted by me, shows IUP, normal fetal cardiac activity, rate of 147.  Radiology report reviewed   PROCEDURES:  Procedures   MEDICATIONS ORDERED IN ED: Medications  rho (d) immune globulin (RHIG/RHOPHYLAC) injection 300 mcg (300 mcg Intramuscular Given 10/31/22 0213)     IMPRESSION / MDM / ASSESSMENT AND PLAN / ED COURSE  I reviewed the triage vital signs and the nursing notes.  DDx: Threatened miscarriage, subchorionic hematoma, fetomaternal blood type incompatibility.  Doubt pelvic infection.  Doubt appendicitis colitis or GI perforation.  Patient's presentation is most consistent with acute presentation with potential threat to life or bodily function.  Patient presents with pelvic pain and vaginal bleeding.  Will obtain ultrasound.  Labs unremarkable.   Clinical Course as of 10/31/22 1610  [name] Oct 31, 2022  [id number] Ultrasound unremarkable.  No subchorionic hemorrhage.  Normal fetal heart rate.  RhoGAM given.  Stable for discharge [PS]    Clinical Course User Index [PS] Sharman Cheek, MD     FINAL CLINICAL IMPRESSION(S) / ED DIAGNOSES   Final diagnoses:  Vaginal bleeding in pregnancy     Rx / DC Orders   ED Discharge Orders     None        Note:  This document was prepared using Dragon voice recognition software and may include unintentional dictation errors.   Sharman Cheek, MD 10/31/22 [phone]

## 2022-10-31 NOTE — Discharge Instructions (Addendum)
Please follow up with your obstetrics provider this week for further monitoring of your symptoms.  Your HCG level today is 82,800. We gave you an injection of RhoGAM to protect against possible blood type mismatch between you and the pregnancy.

## 2022-11-01 LAB — RHOGAM INJECTION: Unit division: 0

## 2022-11-01 LAB — CYTOLOGY - PAP
Chlamydia: NEGATIVE
Comment: NEGATIVE
Comment: NEGATIVE
Comment: NORMAL
Diagnosis: UNDETERMINED — AB
High risk HPV: NEGATIVE
Neisseria Gonorrhea: NEGATIVE

## 2022-11-01 LAB — ABO/RH: ABO/RH(D): A NEG

## 2022-11-03 ENCOUNTER — Ambulatory Visit (INDEPENDENT_AMBULATORY_CARE_PROVIDER_SITE_OTHER): Payer: Medicaid Other | Admitting: Family Medicine

## 2022-11-03 ENCOUNTER — Encounter: Payer: Self-pay | Admitting: Family Medicine

## 2022-11-03 VITALS — BP 127/85 | HR 108 | Wt 173.0 lb

## 2022-11-03 DIAGNOSIS — R8271 Bacteriuria: Secondary | ICD-10-CM

## 2022-11-03 DIAGNOSIS — Z3401 Encounter for supervision of normal first pregnancy, first trimester: Secondary | ICD-10-CM

## 2022-11-03 DIAGNOSIS — Z6791 Unspecified blood type, Rh negative: Secondary | ICD-10-CM

## 2022-11-03 DIAGNOSIS — O99891 Other specified diseases and conditions complicating pregnancy: Secondary | ICD-10-CM

## 2022-11-03 DIAGNOSIS — O26899 Other specified pregnancy related conditions, unspecified trimester: Secondary | ICD-10-CM

## 2022-11-03 NOTE — Progress Notes (Signed)
ROB here for F/U seen at [facility] ER. 10/30/22 for vaginal bleeding noted intercourse prior yet bleeding did not start until 3 hrs later.  pt reports waiting from 8 pm-5 am to be seen pt made aware to go to MAU from now on   denies any bleeding today, and no pain.

## 2022-11-04 LAB — PANORAMA PRENATAL TEST FULL PANEL:PANORAMA TEST PLUS 5 ADDITIONAL MICRODELETIONS: FETAL FRACTION: 9.6

## 2022-11-04 NOTE — Progress Notes (Signed)
   PRENATAL VISIT NOTE  Subjective:  Julie Rios is a 22 y.o. G1P0 at 110w5d being seen today for ongoing prenatal care.  She is currently monitored for the following issues for this low-risk pregnancy and has Suicidal ideation; MDD (major depressive disorder), recurrent severe, without psychosis (HCC); Generalized anxiety disorder; Encounter for supervision of normal first pregnancy in first trimester; Nausea and vomiting in pregnancy; Rh negative state in antepartum period; and Asymptomatic bacteriuria during pregnancy in first trimester on their problem list.  Patient reports no complaints. Recent ED visit for postcoital bleeding, now resolved.  Contractions: Not present. Vag. Bleeding: None.  Movement: Absent. Denies leaking of fluid.   The following portions of the patient's history were reviewed and updated as appropriate: allergies, current medications, past family history, past medical history, past social history, past surgical history and problem list.   Objective:   Vitals:   11/03/22 1441  BP: 127/85  Pulse: (!) 108  Weight: 173 lb (78.5 kg)    Fetal Status: Fetal Heart Rate (bpm): 154   Movement: Absent     General:  Alert, oriented and cooperative. Patient is in no acute distress.  Skin: Skin is warm and dry. No rash noted.   Cardiovascular: Normal heart rate noted  Respiratory: Normal respiratory effort, no problems with respiration noted  Abdomen: Soft, gravid, appropriate for gestational age.  Pain/Pressure: Absent     Pelvic: Cervical exam deferred        Extremities: Normal range of motion.  Edema: None  Mental Status: Normal mood and affect. Normal behavior. Normal judgment and thought content.   Assessment and Plan:  Pregnancy: G1P0 at [redacted]w[redacted]d 1. Rh negative state in antepartum period S/p Rhogam due to bleeding  2. Encounter for supervision of normal first pregnancy in first trimester Has anatomy u/s scheduled Bleeding precautions.   3. Asymptomatic  bacteriuria during pregnancy in first trimester Enterococcus, s/p treatment  General obstetric precautions including but not limited to vaginal bleeding, contractions, leaking of fluid and fetal movement were reviewed in detail with the patient. Please refer to After Visit Summary for other counseling recommendations.   No follow-ups on file.  Future Appointments  Date Time Provider Department Center  11/22/2022  1:50 PM Tereso Newcomer, MD [facility]  12/08/2022 10:00 AM [facility]  12/22/2022 10:35 AM Reva Bores, MD [facility]    Reva Bores, MD

## 2022-11-06 LAB — HORIZON CUSTOM: REPORT SUMMARY: NEGATIVE

## 2022-11-22 ENCOUNTER — Ambulatory Visit: Payer: MEDICAID | Admitting: Obstetrics & Gynecology

## 2022-11-22 VITALS — BP 105/70 | HR 97 | Wt 173.0 lb

## 2022-11-22 DIAGNOSIS — Z3402 Encounter for supervision of normal first pregnancy, second trimester: Secondary | ICD-10-CM

## 2022-11-22 DIAGNOSIS — Z3A17 17 weeks gestation of pregnancy: Secondary | ICD-10-CM

## 2022-11-22 NOTE — Progress Notes (Signed)
   PRENATAL VISIT NOTE  Subjective:  Julie Rios is a 22 y.o. G1P0 at [redacted]w[redacted]d being seen today for ongoing prenatal care.  She is currently monitored for the following issues for this low-risk pregnancy and has Suicidal ideation; MDD (major depressive disorder), recurrent severe, without psychosis (HCC); Generalized anxiety disorder; Encounter for supervision of normal first pregnancy; Nausea and vomiting in pregnancy; Rh negative state in antepartum period; and Asymptomatic bacteriuria during pregnancy in first trimester on their problem list.  Patient reports no complaints.  Contractions: Not present. Vag. Bleeding: None.  Movement: Present. Denies leaking of fluid.   The following portions of the patient's history were reviewed and updated as appropriate: allergies, current medications, past family history, past medical history, past social history, past surgical history and problem list.   Objective:   Vitals:   11/22/22 1424  BP: 105/70  Pulse: 97  Weight: 173 lb (78.5 kg)    Fetal Status: Fetal Heart Rate (bpm): 142   Movement: Present     General:  Alert, oriented and cooperative. Patient is in no acute distress.  Skin: Skin is warm and dry. No rash noted.   Cardiovascular: Normal heart rate noted  Respiratory: Normal respiratory effort, no problems with respiration noted  Abdomen: Soft, gravid, appropriate for gestational age.  Pain/Pressure: Present     Pelvic: Cervical exam deferred        Extremities: Normal range of motion.  Edema: None  Mental Status: Normal mood and affect. Normal behavior. Normal judgment and thought content.   Assessment and Plan:  Pregnancy: G1P0 at [redacted]w[redacted]d 1. [redacted] weeks gestation of pregnancy 2. Encounter for supervision of normal first pregnancy in second trimester LR NIPS.  Declined AFP today. Anatomy scan scheduled. No other complaints or concerns.  Routine obstetric precautions reviewed.  Please refer to After Visit Summary for other counseling  recommendations.   Return in 4 weeks (on 12/20/2022) for OFFICE OB VISIT (MD or APP).  Future Appointments  Date Time Provider Department Center  12/08/2022 10:00 AM [facility] [place]  12/22/2022 10:35 AM Reva Bores, MD [facility]    Jaynie Collins, MD

## 2022-11-26 ENCOUNTER — Encounter (HOSPITAL_COMMUNITY): Payer: Self-pay | Admitting: Obstetrics and Gynecology

## 2022-11-26 ENCOUNTER — Inpatient Hospital Stay (HOSPITAL_COMMUNITY): Payer: MEDICAID

## 2022-11-26 ENCOUNTER — Other Ambulatory Visit: Payer: Self-pay

## 2022-11-26 ENCOUNTER — Inpatient Hospital Stay (HOSPITAL_COMMUNITY)
Admission: AD | Admit: 2022-11-26 | Discharge: 2022-11-29 | DRG: 832 | Disposition: A | Discharge status: Home / Self Care | Payer: MEDICAID | Attending: Obstetrics and Gynecology | Admitting: Obstetrics and Gynecology

## 2022-11-26 DIAGNOSIS — O2302 Infections of kidney in pregnancy, second trimester: Secondary | ICD-10-CM | POA: Diagnosis present

## 2022-11-26 DIAGNOSIS — Z6791 Unspecified blood type, Rh negative: Secondary | ICD-10-CM

## 2022-11-26 DIAGNOSIS — Z3A18 18 weeks gestation of pregnancy: Secondary | ICD-10-CM | POA: Diagnosis not present

## 2022-11-26 DIAGNOSIS — N12 Tubulo-interstitial nephritis, not specified as acute or chronic: Secondary | ICD-10-CM | POA: Diagnosis not present

## 2022-11-26 DIAGNOSIS — O210 Mild hyperemesis gravidarum: Secondary | ICD-10-CM | POA: Diagnosis present

## 2022-11-26 DIAGNOSIS — Z87440 Personal history of urinary (tract) infections: Secondary | ICD-10-CM

## 2022-11-26 DIAGNOSIS — O219 Vomiting of pregnancy, unspecified: Secondary | ICD-10-CM

## 2022-11-26 DIAGNOSIS — Z1611 Resistance to penicillins: Secondary | ICD-10-CM | POA: Diagnosis present

## 2022-11-26 DIAGNOSIS — Z3A17 17 weeks gestation of pregnancy: Secondary | ICD-10-CM

## 2022-11-26 DIAGNOSIS — R8271 Bacteriuria: Secondary | ICD-10-CM

## 2022-11-26 DIAGNOSIS — Z3402 Encounter for supervision of normal first pregnancy, second trimester: Principal | ICD-10-CM

## 2022-11-26 DIAGNOSIS — M549 Dorsalgia, unspecified: Secondary | ICD-10-CM | POA: Diagnosis present

## 2022-11-26 DIAGNOSIS — O99891 Other specified diseases and conditions complicating pregnancy: Secondary | ICD-10-CM

## 2022-11-26 DIAGNOSIS — O26892 Other specified pregnancy related conditions, second trimester: Secondary | ICD-10-CM | POA: Diagnosis present

## 2022-11-26 LAB — COMPREHENSIVE METABOLIC PANEL
ALT: 18 U/L (ref 0–44)
AST: 18 U/L (ref 15–41)
Albumin: 3.2 g/dL — ABNORMAL LOW (ref 3.5–5.0)
Alkaline Phosphatase: 59 U/L (ref 38–126)
Anion gap: 11 (ref 5–15)
BUN: <5 mg/dL — ABNORMAL LOW (ref 6–20)
CO2: 18 mmol/L — ABNORMAL LOW (ref 22–32)
Calcium: 8.9 mg/dL (ref 8.9–10.3)
Chloride: 103 mmol/L (ref 98–111)
Creatinine, Ser: 0.6 mg/dL (ref 0.44–1.00)
GFR, Estimated: >60 mL/min (ref >60)
Glucose, Bld: 78 mg/dL (ref 70–99)
Potassium: 3.6 mmol/L (ref 3.5–5.1)
Sodium: 132 mmol/L — ABNORMAL LOW (ref 135–145)
Total Bilirubin: 2.1 mg/dL — ABNORMAL HIGH (ref 0.3–1.2)
Total Protein: 7.1 g/dL (ref 6.5–8.1)

## 2022-11-26 LAB — TYPE AND SCREEN
ABO/RH(D): A NEG
Antibody Screen: POSITIVE

## 2022-11-26 LAB — URINALYSIS, ROUTINE W REFLEX MICROSCOPIC
Bilirubin Urine: NEGATIVE
Glucose, UA: NEGATIVE mg/dL
Ketones, ur: 80 mg/dL — AB
Nitrite: NEGATIVE
Protein, ur: 30 mg/dL — AB
Specific Gravity, Urine: 1.019 (ref 1.005–1.030)
WBC, UA: >50 WBC/hpf (ref 0–5)
pH: 5 (ref 5.0–8.0)

## 2022-11-26 LAB — CBC WITH DIFFERENTIAL/PLATELET
Abs Immature Granulocytes: 0.12 10*3/uL — ABNORMAL HIGH (ref 0.00–0.07)
Basophils Absolute: 0 10*3/uL (ref 0.0–0.1)
Basophils Relative: 0 %
Eosinophils Absolute: 0 10*3/uL (ref 0.0–0.5)
Eosinophils Relative: 0 %
HCT: 38.5 % (ref 36.0–46.0)
Hemoglobin: 12.8 g/dL (ref 12.0–15.0)
Immature Granulocytes: 1 %
Lymphocytes Relative: 6 %
Lymphs Abs: 1 10*3/uL (ref 0.7–4.0)
MCH: 29.9 pg (ref 26.0–34.0)
MCHC: 33.2 g/dL (ref 30.0–36.0)
MCV: 90 fL (ref 80.0–100.0)
Monocytes Absolute: 0.7 10*3/uL (ref 0.1–1.0)
Monocytes Relative: 4 %
Neutro Abs: 15.4 10*3/uL — ABNORMAL HIGH (ref 1.7–7.7)
Neutrophils Relative %: 89 %
Platelets: 338 10*3/uL (ref 150–400)
RBC: 4.28 MIL/uL (ref 3.87–5.11)
RDW: 13.8 % (ref 11.5–15.5)
WBC: 17.3 10*3/uL — ABNORMAL HIGH (ref 4.0–10.5)
nRBC: 0 % (ref 0.0–0.2)

## 2022-11-26 MED ORDER — ONDANSETRON HCL 4 MG/2ML IJ SOLN
4.0000 mg | Freq: Three times a day (TID) | INTRAMUSCULAR | Status: DC | PRN
  Administered 2022-11-26 – 2022-11-27 (×2): 4 mg via INTRAVENOUS
  Filled 2022-11-26 (×2): qty 2

## 2022-11-26 MED ORDER — LACTATED RINGERS IV SOLN: INTRAVENOUS | Status: AC

## 2022-11-26 MED ORDER — ACETAMINOPHEN 325 MG PO TABS: 650.0000 mg | ORAL_TABLET | ORAL | Status: DC | PRN

## 2022-11-26 MED ORDER — OXYCODONE HCL 5 MG PO TABS
5.0000 mg | ORAL_TABLET | ORAL | Status: DC | PRN
  Administered 2022-11-26: 5 mg via ORAL
  Administered 2022-11-27: 10 mg via ORAL
  Administered 2022-11-27: 5 mg via ORAL
  Administered 2022-11-28 (×2): 10 mg via ORAL
  Filled 2022-11-26: qty 2
  Filled 2022-11-26 (×2): qty 1
  Filled 2022-11-26 (×2): qty 2

## 2022-11-26 MED ORDER — LACTATED RINGERS IV SOLN: INTRAVENOUS | Status: DC

## 2022-11-26 MED ORDER — PRENATAL MULTIVITAMIN CH
1.0000 | ORAL_TABLET | Freq: Every day | ORAL | Status: DC
  Administered 2022-11-27 – 2022-11-29 (×3): 1 via ORAL
  Filled 2022-11-26 (×3): qty 1

## 2022-11-26 MED ORDER — ACETAMINOPHEN 325 MG PO TABS
650.0000 mg | ORAL_TABLET | Freq: Four times a day (QID) | ORAL | Status: DC | PRN
  Administered 2022-11-27 – 2022-11-28 (×2): 650 mg via ORAL
  Filled 2022-11-26 (×2): qty 2

## 2022-11-26 MED ORDER — ONDANSETRON HCL 4 MG PO TABS: 4.0000 mg | ORAL_TABLET | Freq: Three times a day (TID) | ORAL | Status: DC | PRN

## 2022-11-26 MED ORDER — ONDANSETRON HCL 4 MG/2ML IJ SOLN: 4.0000 mg | Freq: Three times a day (TID) | INTRAMUSCULAR | Status: DC | PRN

## 2022-11-26 MED ORDER — LACTATED RINGERS IV BOLUS
1000.0000 mL | Freq: Once | INTRAVENOUS | Status: AC
  Administered 2022-11-26: 1000 mL via INTRAVENOUS

## 2022-11-26 MED ORDER — ACETAMINOPHEN 500 MG PO TABS
1000.0000 mg | ORAL_TABLET | Freq: Once | ORAL | Status: AC
  Administered 2022-11-26: 1000 mg via ORAL
  Filled 2022-11-26: qty 2

## 2022-11-26 MED ORDER — DOCUSATE SODIUM 100 MG PO CAPS: 100.0000 mg | ORAL_CAPSULE | Freq: Two times a day (BID) | ORAL | Status: DC | PRN

## 2022-11-26 MED ORDER — SODIUM CHLORIDE 0.9 % IV SOLN: INTRAVENOUS | Status: DC

## 2022-11-26 MED ORDER — ENOXAPARIN SODIUM 40 MG/0.4ML IJ SOSY
40.0000 mg | PREFILLED_SYRINGE | INTRAMUSCULAR | Status: DC
  Administered 2022-11-26 – 2022-11-27 (×2): 40 mg via SUBCUTANEOUS
  Filled 2022-11-26 (×2): qty 0.4

## 2022-11-26 MED ORDER — CALCIUM CARBONATE ANTACID 500 MG PO CHEW
2.0000 | CHEWABLE_TABLET | ORAL | Status: DC | PRN
  Administered 2022-11-27: 400 mg via ORAL
  Filled 2022-11-26: qty 2

## 2022-11-26 MED ORDER — ONDANSETRON HCL 4 MG PO TABS
4.0000 mg | ORAL_TABLET | Freq: Three times a day (TID) | ORAL | Status: DC | PRN
  Administered 2022-11-27 – 2022-11-28 (×3): 4 mg via ORAL
  Filled 2022-11-26 (×3): qty 1

## 2022-11-26 MED ORDER — SODIUM CHLORIDE 0.9 % IV SOLN
2.0000 g | INTRAVENOUS | Status: DC
  Administered 2022-11-26 – 2022-11-27 (×2): 2 g via INTRAVENOUS
  Filled 2022-11-26 (×2): qty 20

## 2022-11-26 MED ORDER — ZOLPIDEM TARTRATE 5 MG PO TABS
5.0000 mg | ORAL_TABLET | Freq: Every evening | ORAL | Status: DC | PRN
  Administered 2022-11-27 – 2022-11-28 (×2): 5 mg via ORAL
  Filled 2022-11-26 (×2): qty 1

## 2022-11-26 MED ORDER — MORPHINE SULFATE (PF) 4 MG/ML IV SOLN
2.0000 mg | Freq: Once | INTRAVENOUS | Status: AC
  Administered 2022-11-26: 2 mg via INTRAVENOUS

## 2022-11-26 MED ORDER — ONDANSETRON HCL 4 MG/2ML IJ SOLN
4.0000 mg | Freq: Once | INTRAMUSCULAR | Status: AC
  Administered 2022-11-26: 4 mg via INTRAVENOUS

## 2022-11-26 NOTE — MAU Note (Signed)
.  Julie Rios is a 22 y.o. at [redacted]w[redacted]d here in MAU reporting: right back/flank pain that  started yesterday. Has hd pain like this with kidney infection. Pt stated she has had several and feels just like this. Pt is crying in triage because of pain and frustration of   Onset of complaint: yesterday  Pain score: 8-9 Vitals:   11/26/22 1557  BP: 114/70  Pulse: (!) 103  Resp: 18  Temp: 98.1 F (36.7 C)     FHT:147  Lab orders placed from triage:  u/a

## 2022-11-26 NOTE — H&P (Signed)
Chief Complaint:  Back Pain  HPI   Event Date/Time   First Provider Initiated Contact with Patient 11/26/22 1624     Julie Rios is a 22 y.o. G1P0 at [redacted]w[redacted]d who presents to maternity admissions reporting severe flank pain on both sides, dysuria and nausea/vomiting. Has a history of recurrent UTIs and has had kidney infections, says this feels like every kidney infection she's had. Pain started yesterday afternoon and got worse throughout the night, has not been able to keep anything down since yesterday. Denies vaginal bleeding or discharge, can feel occasional fetal movement.   Pregnancy Course: Receives care at [facility], prenatal records reviewed.  Past Medical History:  Diagnosis Date   Anxiety    Depression    MDD (major depressive disorder), single episode, moderate (HCC)    OB History  Gravida Para Term Preterm AB Living  1            SAB IAB Ectopic Multiple Live Births               # Outcome Date GA Lbr Len/2nd Weight Sex Type Anes PTL Lv  1 Current            Past Surgical History:  Procedure Laterality Date   NO PAST SURGERIES     Family History  Problem Relation Age of Onset   Diabetes Mother    Cirrhosis Mother    Diabetes Father    Social History   Tobacco Use   Smoking status: Never    Passive exposure: Yes   Smokeless tobacco: Never  Vaping Use   Vaping status: Some Days   Substances: Nicotine  Substance Use Topics   Alcohol use: No   Drug use: No   No Known Allergies Medications Prior to Admission  Medication Sig Dispense Refill Last Dose   famotidine (PEPCID) 20 MG tablet Take 1 tablet (20 mg total) by mouth 2 (two) times daily. 60 tablet 3 11/25/2022   ondansetron (ZOFRAN-ODT) 4 MG disintegrating tablet Take 1 tablet (4 mg total) by mouth every 6 (six) hours as needed for nausea. 20 tablet 2    Prenatal Vit-Fe Fumarate-FA (PRENATAL VITAMIN) 27-0.8 MG TABS Take 1 tablet by mouth daily. (Patient not taking: Reported on 11/22/2022) 30 tablet 0     promethazine (PHENERGAN) 25 MG suppository Place 1 suppository (25 mg total) rectally every 6 (six) hours as needed for nausea or vomiting. (Patient not taking: Reported on 11/22/2022) 12 each 2    I have reviewed patient's Past Medical Hx, Surgical Hx, Family Hx, Social Hx, medications and allergies.   ROS  Pertinent items noted in HPI and remainder of comprehensive ROS otherwise negative.   PHYSICAL EXAM  Patient Vitals for the past 24 hrs:  BP Temp Temp src Pulse Resp Height  11/26/22 1709 -- 99.3 F (37.4 C) Oral -- -- --  11/26/22 1557 114/70 98.1 F (36.7 C) -- (!) 103 18 5\' 4"  (1.626 m)   Constitutional: Well-developed, well-nourished female in no acute distress.  Cardiovascular: normal rate & rhythm, warm and well-perfused Respiratory: normal effort, no problems with respiration noted GI: Abd soft, non-tender, non-distended MS: Extremities nontender, no edema, normal ROM Neurologic: Alert and oriented x 4.  GU: bilateral CVA tenderness Pelvic: exam deferred  FHR:  147   Labs: Results for orders placed or performed during the hospital encounter of 11/26/22 (from the past 24 hour(s))  Urinalysis, Routine w reflex microscopic -Urine, Clean Catch     Status: Abnormal  Collection Time: 11/26/22  4:13 PM  Result Value Ref Range   Color, Urine AMBER (A) YELLOW   APPearance HAZY (A) CLEAR   Specific Gravity, Urine 1.019 1.005 - 1.030   pH 5.0 5.0 - 8.0   Glucose, UA NEGATIVE NEGATIVE mg/dL   Hgb urine dipstick SMALL (A) NEGATIVE   Bilirubin Urine NEGATIVE NEGATIVE   Ketones, ur 80 (A) NEGATIVE mg/dL   Protein, ur 30 (A) NEGATIVE mg/dL   Nitrite NEGATIVE NEGATIVE   Leukocytes,Ua MODERATE (A) NEGATIVE   RBC / HPF 11-20 0 - 5 RBC/hpf   WBC, UA >50 0 - 5 WBC/hpf   Bacteria, UA FEW (A) NONE SEEN   Squamous Epithelial / HPF 11-20 0 - 5 /HPF   Mucus PRESENT   CBC with Differential/Platelet     Status: Abnormal   Collection Time: 11/26/22  5:06 PM  Result Value Ref Range    WBC 17.3 (H) 4.0 - 10.5 K/uL   RBC 4.28 3.87 - 5.11 MIL/uL   Hemoglobin 12.8 12.0 - 15.0 g/dL   HCT 16.1 09.6 - 04.5 %   MCV 90.0 80.0 - 100.0 fL   MCH 29.9 26.0 - 34.0 pg   MCHC 33.2 30.0 - 36.0 g/dL   RDW 40.9 81.1 - 91.4 %   Platelets 338 150 - 400 K/uL   nRBC 0.0 0.0 - 0.2 %   Neutrophils Relative % 89 %   Neutro Abs 15.4 (H) 1.7 - 7.7 K/uL   Lymphocytes Relative 6 %   Lymphs Abs 1.0 0.7 - 4.0 K/uL   Monocytes Relative 4 %   Monocytes Absolute 0.7 0.1 - 1.0 K/uL   Eosinophils Relative 0 %   Eosinophils Absolute 0.0 0.0 - 0.5 K/uL   Basophils Relative 0 %   Basophils Absolute 0.0 0.0 - 0.1 K/uL   Immature Granulocytes 1 %   Abs Immature Granulocytes 0.12 (H) 0.00 - 0.07 K/uL   Imaging:  US RENAL  Result Date: 11/26/2022 CLINICAL DATA:  Seventeen weeks and 6 days pregnant, presenting with flank pain. EXAM: RENAL / URINARY TRACT ULTRASOUND COMPLETE COMPARISON:  None Available. FINDINGS: Right Kidney: Renal measurements: 10.2 cm x 4.8 cm x 5.8 cm = volume: 147.5 mL. Echogenicity within normal limits. No mass or hydronephrosis visualized. Left Kidney: Renal measurements: 11.3 cm x 5.1 cm x 4.5 cm = volume: 134.91 mL. Echogenicity within normal limits. No mass or hydronephrosis visualized. Bladder: The urinary bladder is poorly distended and subsequently limited in evaluation. Other: None. IMPRESSION: Unremarkable renal ultrasound. Electronically Signed   By: Aram Candela M.D.   On: 11/26/2022 17:42    MDM & MAU COURSE  MDM: Moderate  MAU Course: Orders Placed This Encounter  Procedures   Culture, OB Urine   Urine Culture   US RENAL   Urinalysis, Routine w reflex microscopic -Urine, Clean Catch   CBC with Differential/Platelet   Comprehensive metabolic panel   Diet regular Room service appropriate? Yes; Fluid consistency: Thin   Notify physician (specify)   Vital signs   Defer vaginal exam for vaginal bleeding or PROM <37 weeks   Apply Antepartum Care Plan   Initiate  Oral Care Protocol   Initiate Carrier Fluid Protocol   Full code   Type and screen [facility]   Admit to Inpatient (patient's expected length of stay will be greater than 2 midnights or inpatient only procedure)   Meds ordered this encounter  Medications   lactated ringers bolus 1,000 mL  ondansetron (ZOFRAN) injection 4 mg   morphine (PF) 4 MG/ML injection 2 mg   prenatal multivitamin tablet 1 tablet   lactated ringers infusion   acetaminophen (TYLENOL) tablet 1,000 mg   cefTRIAXone (ROCEPHIN) 2 g in sodium chloride 0.9 % 100 mL IVPB    Order Specific Question:   Antibiotic Indication:    Answer:   UTI   ASSESSMENT   1. Encounter for supervision of normal first pregnancy in second trimester   2. Nausea and vomiting in pregnancy   3. Rh negative state in antepartum period   4. Asymptomatic bacteriuria during pregnancy in first trimester    Pt visibly in pain, cannot lay down and tearful on admission. UA, LR bolus, zofran and morphine ordered as well as CBC with diff, CMP and renal U/S.  Labs resulted showing pyelonephritis without nephrolithiasis. Back pain down from 9/10 to 5, still a 6-7/10 with movement. Called Dr. Vergie Living to review case who agreed to admit to antenatal for IV antibiotics. Basic antenatal orders + IV rocephin q24hrs placed.  PLAN  Admit to antenatal for IV antibiotics and pain management Care turned over to Dr. Julien Nordmann, CNM, MSN, [facility] Certified Nurse Midwife, [facility]

## 2022-11-27 LAB — CBC
HCT: 37.9 % (ref 36.0–46.0)
Hemoglobin: 12.7 g/dL (ref 12.0–15.0)
MCH: 30.1 pg (ref 26.0–34.0)
MCHC: 33.5 g/dL (ref 30.0–36.0)
MCV: 89.8 fL (ref 80.0–100.0)
Platelets: 326 10*3/uL (ref 150–400)
RBC: 4.22 MIL/uL (ref 3.87–5.11)
RDW: 14 % (ref 11.5–15.5)
WBC: 17.4 10*3/uL — ABNORMAL HIGH (ref 4.0–10.5)
nRBC: 0 % (ref 0.0–0.2)

## 2022-11-27 LAB — CULTURE, OB URINE

## 2022-11-27 MED ORDER — SODIUM CHLORIDE 0.9 % IV SOLN
INTRAVENOUS | Status: DC | PRN
  Administered 2022-11-27: 10 mL/h via INTRAVENOUS

## 2022-11-27 NOTE — Progress Notes (Signed)
FACULTY PRACTICE ANTEPARTUM PROGRESS NOTE  Julie Rios is a 22 y.o. G1P0 at [redacted]w[redacted]d who is admitted for pyelonephritis.  Estimated Date of Delivery: 04/30/23 Fetal presentation is unsure.  Length of Stay:  1 Days. Admitted 11/26/2022  Subjective: Pt states she feels some better today, but she still had some discomfort in her right flank and diffuse lower pelvic pain.  She denies fever/chills.   She denies uterine contractions, denies bleeding and leaking of fluid per vagina.  Vitals:  Blood pressure 107/66, pulse 99, temperature 99 F (37.2 C), temperature source Oral, resp. rate 17, height 5\' 4"  (1.626 m), last menstrual period 07/24/2022, SpO2 99%. Physical Examination: CONSTITUTIONAL: Well-developed, well-nourished female in no acute distress.  HENT:  Normocephalic, atraumatic. EYES: Conjunctivae and EOM are normal.  NECK: Normal range of motion, supple, no masses. SKIN: Skin is warm and dry. No rash noted. Not diaphoretic. No erythema. No pallor. NEUROLGIC: Alert and oriented to person, place, and time. Normal reflexes, muscle tone coordination. No cranial nerve deficit noted. PSYCHIATRIC: Normal mood and affect. Normal behavior. Normal judgment and thought content. CARDIOVASCULAR: Normal heart rate, mild tachycardia noted, regular rhythm RESPIRATORY: Effort and breath sounds normal, no problems with respiration noted MUSCULOSKELETAL: Normal range of motion. No edema and no tenderness. ABDOMEN: Soft, nontender, nondistended, gravid. CERVIX: deferred  Fetal monitoring: KGM:WNUUVOZ  Results for orders placed or performed during the hospital encounter of 11/26/22 (from the past 48 hour(s))  Urinalysis, Routine w reflex microscopic -Urine, Clean Catch     Status: Abnormal   Collection Time: 11/26/22  4:13 PM  Result Value Ref Range   Color, Urine AMBER (A) YELLOW    Comment: BIOCHEMICALS MAY BE AFFECTED BY COLOR   APPearance HAZY (A) CLEAR   Specific Gravity, Urine 1.019 1.005 -  1.030   pH 5.0 5.0 - 8.0   Glucose, UA NEGATIVE NEGATIVE mg/dL   Hgb urine dipstick SMALL (A) NEGATIVE   Bilirubin Urine NEGATIVE NEGATIVE   Ketones, ur 80 (A) NEGATIVE mg/dL   Protein, ur 30 (A) NEGATIVE mg/dL   Nitrite NEGATIVE NEGATIVE   Leukocytes,Ua MODERATE (A) NEGATIVE   RBC / HPF 11-20 0 - 5 RBC/hpf   WBC, UA >50 0 - 5 WBC/hpf   Bacteria, UA FEW (A) NONE SEEN   Squamous Epithelial / HPF 11-20 0 - 5 /HPF   Mucus PRESENT     Comment: Performed at [facility], [address], [place], [place] [address]  CBC with Differential/Platelet     Status: Abnormal   Collection Time: 11/26/22  5:06 PM  Result Value Ref Range   WBC 17.3 (H) 4.0 - 10.5 K/uL   RBC 4.28 3.87 - 5.11 MIL/uL   Hemoglobin 12.8 12.0 - 15.0 g/dL   HCT 03.4 74.2 - 59.5 %   MCV 90.0 80.0 - 100.0 fL   MCH 29.9 26.0 - 34.0 pg   MCHC 33.2 30.0 - 36.0 g/dL   RDW 63.8 75.6 - 43.3 %   Platelets 338 150 - 400 K/uL   nRBC 0.0 0.0 - 0.2 %   Neutrophils Relative % 89 %   Neutro Abs 15.4 (H) 1.7 - 7.7 K/uL   Lymphocytes Relative 6 %   Lymphs Abs 1.0 0.7 - 4.0 K/uL   Monocytes Relative 4 %   Monocytes Absolute 0.7 0.1 - 1.0 K/uL   Eosinophils Relative 0 %   Eosinophils Absolute 0.0 0.0 - 0.5 K/uL   Basophils Relative 0 %   Basophils Absolute 0.0 0.0 -  0.1 K/uL   Immature Granulocytes 1 %   Abs Immature Granulocytes 0.12 (H) 0.00 - 0.07 K/uL    Comment: Performed at [facility], [address] [place], [place] [address]  Comprehensive metabolic panel     Status: Abnormal   Collection Time: 11/26/22  5:06 PM  Result Value Ref Range   Sodium 132 (L) 135 - 145 mmol/L   Potassium 3.6 3.5 - 5.1 mmol/L   Chloride 103 98 - 111 mmol/L   CO2 18 (L) 22 - 32 mmol/L   Glucose, Bld 78 70 - 99 mg/dL    Comment: Glucose reference range applies only to samples taken after fasting for at least 8 hours.   BUN <5 (L) 6 - 20 mg/dL   Creatinine, Ser 6.04 0.44 - 1.00 mg/dL   Calcium 8.9 8.9 - 54.0 mg/dL   Total  Protein 7.1 6.5 - 8.1 g/dL   Albumin 3.2 (L) 3.5 - 5.0 g/dL   AST 18 15 - 41 U/L   ALT 18 0 - 44 U/L   Alkaline Phosphatase 59 38 - 126 U/L   Total Bilirubin 2.1 (H) 0.3 - 1.2 mg/dL   GFR, Estimated >98 >11 mL/min    Comment: (NOTE) Calculated using the CKD-EPI Creatinine Equation (2021)    Anion gap 11 5 - 15    Comment: Performed at [facility], [address], [place], [place] [address]  Type and screen [facility]     Status: None   Collection Time: 11/26/22  6:29 PM  Result Value Ref Range   ABO/RH(D) A NEG    Antibody Screen POS    Sample Expiration 11/29/2022,2359    Antibody Identification      PASSIVELY ACQUIRED ANTI-D Performed at [facility], [address] [place], [place] [address]   CBC     Status: Abnormal   Collection Time: 11/27/22  2:56 AM  Result Value Ref Range   WBC 17.4 (H) 4.0 - 10.5 K/uL   RBC 4.22 3.87 - 5.11 MIL/uL   Hemoglobin 12.7 12.0 - 15.0 g/dL   HCT 13.0 86.5 - 78.4 %   MCV 89.8 80.0 - 100.0 fL   MCH 30.1 26.0 - 34.0 pg   MCHC 33.5 30.0 - 36.0 g/dL   RDW 69.6 29.5 - 28.4 %   Platelets 326 150 - 400 K/uL   nRBC 0.0 0.0 - 0.2 %    Comment: Performed at [facility], [address], [place], [place] [address]    I have reviewed the patient's current medications.  ASSESSMENT: Principal Problem:   Pyelonephritis Active Problems:   Pyelonephritis affecting pregnancy in second trimester   PLAN: Continue inpatient IV abx. Urine culture is pending. Pt needs to remain afebrile with improvement of WBC and symptoms.   Suppressive oral abx at time of discharge   Continue routine antenatal care.   Mariel Aloe, MD [facility] Faculty Attending, Center for [facility] 11/27/2022 10:41 AM

## 2022-11-28 LAB — CULTURE, OB URINE: Culture: 30000 — AB

## 2022-11-28 LAB — CBC
HCT: 32.3 % — ABNORMAL LOW (ref 36.0–46.0)
Hemoglobin: 10.8 g/dL — ABNORMAL LOW (ref 12.0–15.0)
MCH: 29.2 pg (ref 26.0–34.0)
MCHC: 33.4 g/dL (ref 30.0–36.0)
MCV: 87.3 fL (ref 80.0–100.0)
Platelets: 268 10*3/uL (ref 150–400)
RBC: 3.7 MIL/uL — ABNORMAL LOW (ref 3.87–5.11)
RDW: 13.9 % (ref 11.5–15.5)
WBC: 13.1 10*3/uL — ABNORMAL HIGH (ref 4.0–10.5)
nRBC: 0 % (ref 0.0–0.2)

## 2022-11-28 MED ORDER — CEPHALEXIN 500 MG PO CAPS: 500.0000 mg | ORAL_CAPSULE | Freq: Every day | ORAL | Status: DC

## 2022-11-28 MED ORDER — CEFADROXIL 500 MG PO CAPS: 500.0000 mg | ORAL_CAPSULE | Freq: Two times a day (BID) | ORAL | Status: DC

## 2022-11-28 MED ORDER — CEFADROXIL 500 MG PO CAPS
1000.0000 mg | ORAL_CAPSULE | Freq: Two times a day (BID) | ORAL | Status: DC
  Administered 2022-11-28 – 2022-11-29 (×2): 1000 mg via ORAL
  Filled 2022-11-28 (×3): qty 2

## 2022-11-28 NOTE — Plan of Care (Signed)
  Problem: Education: Goal: Knowledge of disease or condition will improve Outcome: Progressing Goal: Knowledge of the prescribed therapeutic regimen will improve Outcome: Progressing Goal: Individualized Educational Video(s) Outcome: Progressing   Problem: Clinical Measurements: Goal: Complications related to the disease process, condition or treatment will be avoided or minimized Outcome: Progressing   Problem: Health Behavior/Discharge Planning: Goal: Ability to manage health-related needs will improve Outcome: Progressing   Problem: Clinical Measurements: Goal: Ability to maintain clinical measurements within normal limits will improve Outcome: Progressing Goal: Will remain free from infection Outcome: Progressing Goal: Diagnostic test results will improve Outcome: Progressing Goal: Respiratory complications will improve Outcome: Progressing Goal: Cardiovascular complication will be avoided Outcome: Progressing   Problem: Activity: Goal: Risk for activity intolerance will decrease Outcome: Progressing   Problem: Nutrition: Goal: Adequate nutrition will be maintained Outcome: Progressing   Problem: Coping: Goal: Level of anxiety will decrease Outcome: Progressing   Problem: Elimination: Goal: Will not experience complications related to bowel motility Outcome: Progressing Goal: Will not experience complications related to urinary retention Outcome: Progressing   Problem: Pain Managment: Goal: General experience of comfort will improve Outcome: Progressing   Problem: Safety: Goal: Ability to remain free from injury will improve Outcome: Progressing   Problem: Skin Integrity: Goal: Risk for impaired skin integrity will decrease Outcome: Progressing   

## 2022-11-28 NOTE — Plan of Care (Signed)
  Problem: Education: Goal: Knowledge of disease or condition will improve Outcome: Progressing Goal: Knowledge of the prescribed therapeutic regimen will improve Outcome: Progressing   Problem: Clinical Measurements: Goal: Complications related to the disease process, condition or treatment will be avoided or minimized Outcome: Progressing   Problem: Health Behavior/Discharge Planning: Goal: Ability to manage health-related needs will improve Outcome: Progressing   Problem: Clinical Measurements: Goal: Ability to maintain clinical measurements within normal limits will improve Outcome: Progressing Goal: Will remain free from infection Outcome: Progressing Goal: Diagnostic test results will improve Outcome: Progressing Goal: Respiratory complications will improve Outcome: Progressing Goal: Cardiovascular complication will be avoided Outcome: Progressing   Problem: Activity: Goal: Risk for activity intolerance will decrease Outcome: Progressing   Problem: Nutrition: Goal: Adequate nutrition will be maintained Outcome: Progressing   Problem: Coping: Goal: Level of anxiety will decrease Outcome: Progressing   Problem: Elimination: Goal: Will not experience complications related to bowel motility Outcome: Progressing   Problem: Safety: Goal: Ability to remain free from injury will improve Outcome: Progressing   Problem: Skin Integrity: Goal: Risk for impaired skin integrity will decrease Outcome: Progressing

## 2022-11-28 NOTE — Progress Notes (Signed)
Daily Antepartum Note  Admission Date: 11/26/2022 Current Date: 11/28/2022 8:39 AM  Julie Rios is a 22 y.o. G1P0 at [redacted]w[redacted]d, HD#3, admitted for right sided pyelo.  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 Patient taking some PO this morning.   Objective:    Current Vital Signs 24h Vital Sign Ranges  T 97.9 F (36.6 C) Temp  Avg: 98.4 F (36.9 C)  Min: 97.9 F (36.6 C)  Max: 98.9 F (37.2 C)  BP 110/65 BP  Min: 105/57  Max: 126/73  HR (!) 101 Pulse  Avg: 98  Min: 92  Max: 101  RR 17 Resp  Avg: 17.2  Min: 16  Max: 19  SaO2 96 % Room Air SpO2  Avg: 96.8 %  Min: 95 %  Max: 100 %       24 Hour I/O Current Shift I/O  Time Ins Outs 07/20 0701 - 07/21 0700 In: -  Out: 300  No intake/output data recorded.   Fetal Heart Tones: 137  Physical exam: General: Well nourished, well developed female in no acute distress. Abdomen: gravid nttp Back: no CVAT Cardiovascular: S1, S2 normal, no murmur, rub or gallop, regular rate and rhythm Respiratory: CTAB Extremities: no clubbing, cyanosis or edema Skin: Warm and dry.   Medications: Current Facility-Administered Medications  Medication Dose Route Frequency Provider Last Rate Last Admin   0.9 %  sodium chloride infusion   Intravenous PRN Warden Fillers, MD 10 mL/hr at 11/27/22 0926 10 mL/hr at 11/27/22 0926    acetaminophen (TYLENOL) tablet 650 mg  650 mg Oral Q6H PRN [place] Bing, MD   650 mg at 11/27/22 1803   calcium carbonate (TUMS - dosed in mg elemental calcium) chewable tablet 400 mg of elemental calcium  2 tablet Oral Q4H PRN [place] Bing, MD   400 mg of elemental calcium at 11/27/22 2027   cefTRIAXone (ROCEPHIN) 2 g in sodium chloride 0.9 % 100 mL IVPB  2 g Intravenous Q24H Edd Arbour R, CNM 200 mL/hr at 11/27/22 1759 2 g at 11/27/22 1759   docusate sodium (COLACE) capsule 100 mg  100 mg Oral BID PRN [place] Bing, MD       enoxaparin (LOVENOX) injection 40 mg  40 mg Subcutaneous Q24H Linneus Bing, MD   40 mg at 11/27/22 2239   ondansetron (ZOFRAN) injection 4 mg  4 mg Intravenous Q8H PRN [place] Bing, MD   4 mg at 11/27/22 2027   ondansetron (ZOFRAN) tablet 4 mg  4 mg Oral Q8H PRN [place] Bing, MD   4 mg at 11/27/22 1803   oxyCODONE (Oxy IR/ROXICODONE) immediate release tablet 5-10 mg  5-10 mg Oral Q4H PRN Edd Arbour R, CNM   10 mg at 11/28/22 0026   prenatal multivitamin tablet 1 tablet  1 tablet Oral Q1200 Edd Arbour R, CNM   1 tablet at 11/27/22 0842   zolpidem (AMBIEN) tablet 5 mg  5 mg  Oral QHS PRN [place] Bing, MD   5 mg at 11/27/22 0235    Labs:  UCx: 30k e.coli, susceptibilities pending  Recent Labs  Lab 11/26/22 1706 11/27/22 0256 11/28/22 0450  WBC 17.3* 17.4* 13.1*  HGB 12.8 12.7 10.8*  HCT 38.5 37.9 32.3*  PLT 338 326 268    Recent Labs  Lab 11/26/22 1706  NA 132*  K 3.6  CL 103  CO2 18*  BUN <5*  CREATININE 0.60  CALCIUM 8.9  PROT 7.1  BILITOT 2.1*  ALKPHOS 59  ALT 18  AST 18  GLUCOSE 78   Radiology:  No new imaging  Assessment & Plan:  Patient improving *Pregnancy: continue with qday fetal heart tones *Renal: s/p rocephin x 2 doses. Will switch to duricef and if does okay can d/c home to finish out two week course and then start keflex at bedtime. F/u susceptibilities.  *Preterm: no issues *PPx: lovenox,  oob ad lib *FEN/GI: sliv, regular diet *Dispo: likely tomorrow.  Cornelia Copa MD Attending Center for [facility] (Faculty Practice) GYN Consult Phone: [phone] (M-F, [phone]) & [phone]  (Off hours, weekends, holidays)

## 2022-11-29 DIAGNOSIS — N12 Tubulo-interstitial nephritis, not specified as acute or chronic: Secondary | ICD-10-CM

## 2022-11-29 DIAGNOSIS — Z3A18 18 weeks gestation of pregnancy: Secondary | ICD-10-CM

## 2022-11-29 MED ORDER — CEFADROXIL 500 MG PO CAPS: 1000.0000 mg | ORAL_CAPSULE | Freq: Two times a day (BID) | ORAL | 0 refills | Status: AC

## 2022-11-29 MED ORDER — CEPHALEXIN 500 MG PO CAPS: 500.0000 mg | ORAL_CAPSULE | Freq: Every day | ORAL | 4 refills | Status: DC

## 2022-11-29 MED ORDER — ACETAMINOPHEN 325 MG PO TABS: 650.0000 mg | ORAL_TABLET | Freq: Three times a day (TID) | ORAL | 0 refills | Status: DC | PRN

## 2022-11-29 NOTE — Discharge Summary (Incomplete)
Physician Discharge Summary  Patient ID: Julie Rios MRN: [id number] DOB/AGE: 2000-07-09 21 y.o.  Admit date: 11/26/2022 Discharge date: 11/29/2022  Admission Diagnoses:  Discharge Diagnoses:  Principal Problem:   Pyelonephritis Active Problems:   Pyelonephritis affecting pregnancy in second trimester   Discharged Condition: {condition:18240}  Hospital Course: ***  Consults: {consultation:18241}  Significant Diagnostic Studies: {diagnostics:18242}  Treatments: {Tx:18249}  Discharge Exam: Blood pressure (!) 109/56, pulse 90, temperature 98 F (36.7 C), resp. rate 16, height 5\' 4"  (1.626 m), last menstrual period 07/24/2022, SpO2 100%. {physical AOZH:0865784}  Disposition: Discharge disposition: 01-Home or Self Care       Discharge Instructions     Discharge patient   Complete by: As directed    After morning medications are given.   Discharge disposition: 01-Home or Self Care   Discharge patient date: 11/29/2022      Allergies as of 11/29/2022   No Known Allergies      Medication List     TAKE these medications    acetaminophen 325 MG tablet Commonly known as: TYLENOL Take 2 tablets (650 mg total) by mouth every 8 (eight) hours as needed for mild pain, fever or moderate pain.   cefadroxil 500 MG capsule Commonly known as: DURICEF Take 2 capsules (1,000 mg total) by mouth 2 (two) times daily for 11 days.   cephALEXin 500 MG capsule Commonly known as: KEFLEX Take 1 capsule (500 mg total) by mouth at bedtime. Continue this for the rest of the pregnancy Start taking on: December 11, 2022   famotidine 20 MG tablet Commonly known as: PEPCID Take 1 tablet (20 mg total) by mouth 2 (two) times daily.   ondansetron 4 MG disintegrating tablet Commonly known as: ZOFRAN-ODT Take 1 tablet (4 mg total) by mouth every 6 (six) hours as needed for nausea.   Prenatal Vitamin 27-0.8 MG Tabs Take 1 tablet by mouth daily.   promethazine 25 MG suppository Commonly  known as: PHENERGAN Place 1 suppository (25 mg total) rectally every 6 (six) hours as needed for nausea or vomiting.        Follow-up Information     [facility] at [facility]. Go on 12/22/2022.   Specialty: Obstetrics and Gynecology Contact information: [address] [place] [address] [phone]        [facility] at [facility]. Go on 12/08/2022.   Specialty: Perinatology Contact information: [address] [place] [address] [phone]                Signed: [place] Bing 11/29/2022, 7:44 AM

## 2022-11-29 NOTE — Progress Notes (Signed)
Patient given discharge instructions and denies any questions or concerns. Patient ambulatory off unit at this time. 

## 2022-12-08 ENCOUNTER — Ambulatory Visit: Payer: MEDICAID | Attending: Family Medicine

## 2022-12-08 ENCOUNTER — Other Ambulatory Visit: Payer: Self-pay

## 2022-12-08 VITALS — BP 119/71 | HR 106 | Temp 98.1°F | Ht 64.0 in | Wt 174.0 lb

## 2022-12-08 DIAGNOSIS — Z363 Encounter for antenatal screening for malformations: Secondary | ICD-10-CM | POA: Diagnosis not present

## 2022-12-08 DIAGNOSIS — O99212 Obesity complicating pregnancy, second trimester: Secondary | ICD-10-CM

## 2022-12-08 DIAGNOSIS — Z3401 Encounter for supervision of normal first pregnancy, first trimester: Secondary | ICD-10-CM

## 2022-12-08 DIAGNOSIS — Z3A19 19 weeks gestation of pregnancy: Secondary | ICD-10-CM | POA: Diagnosis not present

## 2022-12-08 DIAGNOSIS — E669 Obesity, unspecified: Secondary | ICD-10-CM

## 2022-12-08 DIAGNOSIS — O219 Vomiting of pregnancy, unspecified: Secondary | ICD-10-CM

## 2022-12-08 DIAGNOSIS — R8271 Bacteriuria: Secondary | ICD-10-CM

## 2022-12-08 DIAGNOSIS — O26899 Other specified pregnancy related conditions, unspecified trimester: Secondary | ICD-10-CM

## 2022-12-22 ENCOUNTER — Ambulatory Visit (INDEPENDENT_AMBULATORY_CARE_PROVIDER_SITE_OTHER): Payer: MEDICAID | Admitting: Family Medicine

## 2022-12-22 VITALS — BP 111/72 | HR 94 | Wt 168.0 lb

## 2022-12-22 DIAGNOSIS — O2302 Infections of kidney in pregnancy, second trimester: Secondary | ICD-10-CM

## 2022-12-22 DIAGNOSIS — O219 Vomiting of pregnancy, unspecified: Secondary | ICD-10-CM

## 2022-12-22 DIAGNOSIS — Z3402 Encounter for supervision of normal first pregnancy, second trimester: Secondary | ICD-10-CM

## 2022-12-22 MED ORDER — PROCHLORPERAZINE MALEATE 10 MG PO TABS: 10.0000 mg | ORAL_TABLET | Freq: Four times a day (QID) | ORAL | 0 refills | Status: DC | PRN

## 2022-12-22 MED ORDER — ENSURE COMPLETE SHAKE PO LIQD
1.0000 | Freq: Three times a day (TID) | ORAL | 3 refills | Status: DC
Start: 2022-12-22 — End: 2023-01-18

## 2022-12-22 NOTE — Progress Notes (Signed)
   PRENATAL VISIT NOTE  Subjective:  Julie Rios is a 22 y.o. G1P0 at [redacted]w[redacted]d being seen today for ongoing prenatal care.  She is currently monitored for the following issues for this low-risk pregnancy and has Suicidal ideation; MDD (major depressive disorder), recurrent severe, without psychosis (HCC); Generalized anxiety disorder; Encounter for supervision of normal first pregnancy; Nausea and vomiting in pregnancy; Rh negative state in antepartum period; Asymptomatic bacteriuria during pregnancy in first trimester; Pyelonephritis; and Pyelonephritis affecting pregnancy in second trimester on their problem list.  Patient reports no complaints.  Contractions: Not present. Vag. Bleeding: None.  Movement: (!) Decreased. Denies leaking of fluid.   The following portions of the patient's history were reviewed and updated as appropriate: allergies, current medications, past family history, past medical history, past social history, past surgical history and problem list.   Objective:   Vitals:   12/22/22 1059  BP: 111/72  Pulse: 94  Weight: 168 lb (76.2 kg)    Fetal Status: Fetal Heart Rate (bpm): 140   Movement: (!) Decreased     General:  Alert, oriented and cooperative. Patient is in no acute distress.  Skin: Skin is warm and dry. No rash noted.   Cardiovascular: Normal heart rate noted  Respiratory: Normal respiratory effort, no problems with respiration noted  Abdomen: Soft, gravid, appropriate for gestational age.  Pain/Pressure: Present (Cramping last night)     Pelvic: Cervical exam deferred        Extremities: Normal range of motion.  Edema: None  Mental Status: Normal mood and affect. Normal behavior. Normal judgment and thought content.   Assessment and Plan:  Pregnancy: G1P0 at [redacted]w[redacted]d 1. Encounter for supervision of normal first pregnancy in second trimester   2. Pyelonephritis affecting pregnancy in second trimester On ppx  3. Nausea and vomiting in pregnancy Trial of  compazine Has failed phenergan and scopolamine. Has Zofran. Try Sea bands and hs protein and grazing throughout the day. - Nutritional Supplements (ENSURE COMPLETE SHAKE) LIQD; Take 1 Can by mouth 3 (three) times daily.  Dispense: 237 mL; Refill: 3  Preterm labor symptoms and general obstetric precautions including but not limited to vaginal bleeding, contractions, leaking of fluid and fetal movement were reviewed in detail with the patient. Please refer to After Visit Summary for other counseling recommendations.   Return in 4 weeks (on 01/19/2023).  Future Appointments  Date Time Provider Department Center  01/17/2023  3:30 PM Federico Flake, MD [facility]  02/01/2023  8:15 AM [facility]  02/01/2023  8:35 AM Lamoni Bing, MD [facility]  03/02/2023  9:00 AM [facility]    Reva Bores, MD

## 2022-12-22 NOTE — Progress Notes (Signed)
ROB   CC: Pt stating that she has lost a lot of weight and is crying and very concerned, Pt stating that over all she feels bad, Pt stating that the ODT zofran helps and some times no relief with the zofran     Decreased movement x 2 days

## 2022-12-23 ENCOUNTER — Emergency Department (HOSPITAL_COMMUNITY)
Admission: EM | Admit: 2022-12-23 | Discharge: 2022-12-23 | Disposition: A | Discharge status: Home / Self Care | Payer: MEDICAID | Attending: Emergency Medicine | Admitting: Emergency Medicine

## 2022-12-23 ENCOUNTER — Encounter (HOSPITAL_COMMUNITY): Payer: Self-pay

## 2022-12-23 ENCOUNTER — Emergency Department (HOSPITAL_COMMUNITY)
Admission: RE | Admit: 2022-12-23 | Discharge: 2022-12-23 | Disposition: A | Discharge status: Home / Self Care | Payer: MEDICAID | Source: Ambulatory Visit | Attending: Emergency Medicine | Admitting: Emergency Medicine

## 2022-12-23 ENCOUNTER — Other Ambulatory Visit: Payer: Self-pay

## 2022-12-23 ENCOUNTER — Emergency Department (HOSPITAL_COMMUNITY): Payer: MEDICAID

## 2022-12-23 DIAGNOSIS — R569 Unspecified convulsions: Secondary | ICD-10-CM | POA: Insufficient documentation

## 2022-12-23 DIAGNOSIS — R8290 Unspecified abnormal findings in urine: Secondary | ICD-10-CM

## 2022-12-23 LAB — CBC WITH DIFFERENTIAL/PLATELET
Abs Immature Granulocytes: 0.08 10*3/uL — ABNORMAL HIGH (ref 0.00–0.07)
Basophils Absolute: 0 10*3/uL (ref 0.0–0.1)
Basophils Relative: 0 %
Eosinophils Absolute: 0 10*3/uL (ref 0.0–0.5)
Eosinophils Relative: 0 %
HCT: 34.7 % — ABNORMAL LOW (ref 36.0–46.0)
Hemoglobin: 11.6 g/dL — ABNORMAL LOW (ref 12.0–15.0)
Immature Granulocytes: 1 %
Lymphocytes Relative: 17 %
Lymphs Abs: 2 10*3/uL (ref 0.7–4.0)
MCH: 28.9 pg (ref 26.0–34.0)
MCHC: 33.4 g/dL (ref 30.0–36.0)
MCV: 86.5 fL (ref 80.0–100.0)
Monocytes Absolute: 0.7 10*3/uL (ref 0.1–1.0)
Monocytes Relative: 6 %
Neutro Abs: 9 10*3/uL — ABNORMAL HIGH (ref 1.7–7.7)
Neutrophils Relative %: 76 %
Platelets: 325 10*3/uL (ref 150–400)
RBC: 4.01 MIL/uL (ref 3.87–5.11)
RDW: 13.5 % (ref 11.5–15.5)
WBC: 11.8 10*3/uL — ABNORMAL HIGH (ref 4.0–10.5)
nRBC: 0 % (ref 0.0–0.2)

## 2022-12-23 LAB — URINALYSIS, ROUTINE W REFLEX MICROSCOPIC
Bilirubin Urine: NEGATIVE
Glucose, UA: NEGATIVE mg/dL
Hgb urine dipstick: NEGATIVE
Ketones, ur: 80 mg/dL — AB
Nitrite: NEGATIVE
Protein, ur: NEGATIVE mg/dL
Specific Gravity, Urine: 1.013 (ref 1.005–1.030)
pH: 6 (ref 5.0–8.0)

## 2022-12-23 LAB — I-STAT CHEM 8, ED
BUN: <3 mg/dL — ABNORMAL LOW (ref 6–20)
Calcium, Ion: 1.15 mmol/L (ref 1.15–1.40)
Chloride: 104 mmol/L (ref 98–111)
Creatinine, Ser: 0.4 mg/dL — ABNORMAL LOW (ref 0.44–1.00)
Glucose, Bld: 83 mg/dL (ref 70–99)
HCT: 35 % — ABNORMAL LOW (ref 36.0–46.0)
Hemoglobin: 11.9 g/dL — ABNORMAL LOW (ref 12.0–15.0)
Potassium: 3.3 mmol/L — ABNORMAL LOW (ref 3.5–5.1)
Sodium: 137 mmol/L (ref 135–145)
TCO2: 20 mmol/L — ABNORMAL LOW (ref 22–32)

## 2022-12-23 LAB — RAPID URINE DRUG SCREEN, HOSP PERFORMED
Amphetamines: NOT DETECTED
Barbiturates: NOT DETECTED
Benzodiazepines: NOT DETECTED
Cocaine: NOT DETECTED
Opiates: NOT DETECTED
Tetrahydrocannabinol: POSITIVE — AB

## 2022-12-23 LAB — ETHANOL: Alcohol, Ethyl (B): <10 mg/dL (ref <10)

## 2022-12-23 NOTE — Progress Notes (Signed)
Pt not available for EEG and in MRI currently

## 2022-12-23 NOTE — Discharge Instructions (Addendum)
No driving for 6 months or until cleared by neurology Follow-up with OB/GYN after the weekend for urine culture result.  Return for further seizure activity, fevers, pain with urination, abdominal pain, vaginal bleeding, fluid leaking or new concern.

## 2022-12-23 NOTE — Progress Notes (Signed)
EEG complete - results pending 

## 2022-12-23 NOTE — ED Provider Notes (Addendum)
Patient care signed out to follow-up urinalysis result.  Patient had seizure-like activity was value by neurology and OB/GYN earlier this morning.  Night provider explained neurology recommend outpatient neurology follow-up.  On reassessment patient well-appearing no seizure activity neurologically doing well.  No infectious symptoms.  Patient is on daily Keflex due to recurrent UTIs.  Urinalysis reviewed independently showing THC positive and small leukocytes.  Urine culture added on and patient is already on Keflex.  Recommend close follow-up with OB/GYN and follow-up culture results.  Oral fluids for mild dehydration with ketones in the urine.  Labs Reviewed  CBC WITH DIFFERENTIAL/PLATELET - Abnormal; Notable for the following components:      Result Value   WBC 11.8 (*)    Hemoglobin 11.6 (*)    HCT 34.7 (*)    Neutro Abs 9.0 (*)    Abs Immature Granulocytes 0.08 (*)    All other components within normal limits  RAPID URINE DRUG SCREEN, HOSP PERFORMED - Abnormal; Notable for the following components:   Tetrahydrocannabinol POSITIVE (*)    All other components within normal limits  URINALYSIS, ROUTINE W REFLEX MICROSCOPIC - Abnormal; Notable for the following components:   APPearance CLOUDY (*)    Ketones, ur 80 (*)    Leukocytes,Ua SMALL (*)    Bacteria, UA FEW (*)    All other components within normal limits  I-STAT CHEM 8, ED - Abnormal; Notable for the following components:   Potassium 3.3 (*)    BUN <3 (*)    Creatinine, Ser 0.40 (*)    TCO2 20 (*)    Hemoglobin 11.9 (*)    HCT 35.0 (*)    All other components within normal limits  URINE CULTURE  ETHANOL   Prior to discharge reviewed medical records and no formal neurology note was placed this morning by on-call neurologist.  Discussed with current on-call neurologist recommended MRI and stat EEG.  MRI results reviewed negative.  Discussed with neurology who is reading EEG currently.   Blane Ohara, MD 12/23/22 6440     Blane Ohara, MD 12/23/22 1600

## 2022-12-23 NOTE — Progress Notes (Signed)
55Isidore Rios called for pt in [facility] 22weeks with c/o possible seizures.   45Isidore Rios at bedside, pt in CT at this time.   0541: Pt returned from CT. FHR dopplered 136-145bpm, movement audible upon doppler. Pt unable to feel baby move so far in pregnancy. Roommates of pt say she woke up and started acting strange. She was throwing things at the wall and threw herself into the wall. Was not responding to her name and when roommates cornered pt she had a seizure and hit her head on a dresser. Then had several mini seizures lasting 10seconds each.. pt c/o sensitivity to light, headache, and all over body aches and tension. No complain of LOF, vaginal bleeding, or abdominal cramps.   34: Dr. Berton Lan called and notified of pt 21&5, G1P0. Came to [facility] w/ complaint of altered mental status and potential seizures.  Roommates of pt say she woke up and started acting strange. She was throwing things at the wall and threw herself into the wall. Was not responding to her name and when roommates cornered pt she had a seizure and hit her head on a dresser. Then had several mini seizures lasting 10seconds each. No complaint of LOF, vaginal bleeding, or abdominal cramps. CT normal, BP WNL, K+ 3.3, BUN <3, Crt 0.4. Pt OB cleared, no further orders.

## 2022-12-23 NOTE — ED Notes (Signed)
Returned from MRI 

## 2022-12-23 NOTE — ED Provider Notes (Signed)
[place] EMERGENCY DEPARTMENT AT [facility] Provider Note   CSN: [id number] Arrival date & time: 12/23/22  0502     History  Chief Complaint  Patient presents with   Seizures    Julie Rios is a 22 y.o. female.  The history is provided by the EMS personnel. The history is limited by the condition of the patient.  Seizures Seizure activity on arrival: no   Seizure type:  Grand mal Preceding symptoms comment:  Hitting walls and throwing water Episode characteristics: generalized shaking   Postictal symptoms: somnolence   Return to baseline: no   Severity:  Mild Timing:  Once Progression:  Resolved Context: not change in medication and not intracranial shunt   Recent head injury:  Immediately preceding the event PTA treatment:  None History of seizures: no   Patient with depression and who is 21weeks 5/8 with her first pregnancy presents with hitting walls and throwing water at her home and then was cornered by roommates reported had generalized shaking.      Past Medical History:  Diagnosis Date   Anxiety    Depression    MDD (major depressive disorder), single episode, moderate (HCC)      Home Medications Prior to Admission medications   Medication Sig Start Date End Date Taking? Authorizing Provider  acetaminophen (TYLENOL) 325 MG tablet Take 2 tablets (650 mg total) by mouth every 8 (eight) hours as needed for mild pain, fever or moderate pain. Patient not taking: Reported on 12/22/2022 11/29/22   [place] Bing, MD  cephALEXin (KEFLEX) 500 MG capsule Take 1 capsule (500 mg total) by mouth at bedtime. Continue this for the rest of the pregnancy 12/11/22   [place] Bing, MD  famotidine (PEPCID) 20 MG tablet Take 1 tablet (20 mg total) by mouth 2 (two) times daily. 10/06/22   Reva Bores, MD  Nutritional Supplements (ENSURE COMPLETE SHAKE) LIQD Take 1 Can by mouth 3 (three) times daily. 12/22/22   Reva Bores, MD  ondansetron (ZOFRAN-ODT) 4 MG  disintegrating tablet Take 1 tablet (4 mg total) by mouth every 6 (six) hours as needed for nausea. 09/22/22   Venora Maples, MD  Prenatal Vit-Fe Fumarate-FA (PRENATAL VITAMIN) 27-0.8 MG TABS Take 1 tablet by mouth daily. Patient not taking: Reported on 11/22/2022 10/27/22   Reva Bores, MD  prochlorperazine (COMPAZINE) 10 MG tablet Take 1 tablet (10 mg total) by mouth every 6 (six) hours as needed for nausea or vomiting. 12/22/22   Reva Bores, MD      Allergies    Patient has no known allergies.    Review of Systems   Review of Systems  Unable to perform ROS: Acuity of condition  Neurological:  Positive for seizures.    Physical Exam Updated Vital Signs BP 111/68 (BP Location: Right Arm)   Pulse 95   Temp 98.5 F (36.9 C) (Oral)   Resp 18   Ht 5\' 4"  (1.626 m)   Wt 74.8 kg   LMP 07/24/2022   SpO2 98%   BMI 28.32 kg/m  Physical Exam Vitals and nursing note reviewed. Exam conducted with a chaperone present.  Constitutional:      General: She is not in acute distress.    Appearance: She is well-developed.  HENT:     Head: Normocephalic and atraumatic.     Nose: Nose normal.     Mouth/Throat:     Mouth: Mucous membranes are moist.     Pharynx: Oropharynx is  clear.  Eyes:     Pupils: Pupils are equal, round, and reactive to light.  Cardiovascular:     Rate and Rhythm: Normal rate and regular rhythm.     Pulses: Normal pulses.     Heart sounds: Normal heart sounds.  Pulmonary:     Effort: Pulmonary effort is normal. No respiratory distress.     Breath sounds: Normal breath sounds.  Abdominal:     General: Bowel sounds are normal. There is no distension.     Palpations: Abdomen is soft.     Tenderness: There is no abdominal tenderness. There is no guarding or rebound.  Musculoskeletal:        General: Normal range of motion.     Cervical back: Normal range of motion and neck supple.  Skin:    General: Skin is warm and dry.     Capillary Refill: Capillary  refill takes less than 2 seconds.     Findings: No erythema or rash.  Neurological:     General: No focal deficit present.     Deep Tendon Reflexes: Reflexes normal.     ED Results / Procedures / Treatments   Labs (all labs ordered are listed, but only abnormal results are displayed) Results for orders placed or performed during the hospital encounter of 12/23/22  CBC with Differential  Result Value Ref Range   WBC 11.8 (H) 4.0 - 10.5 K/uL   RBC 4.01 3.87 - 5.11 MIL/uL   Hemoglobin 11.6 (L) 12.0 - 15.0 g/dL   HCT 16.1 (L) 09.6 - 04.5 %   MCV 86.5 80.0 - 100.0 fL   MCH 28.9 26.0 - 34.0 pg   MCHC 33.4 30.0 - 36.0 g/dL   RDW 40.9 81.1 - 91.4 %   Platelets 325 150 - 400 K/uL   nRBC 0.0 0.0 - 0.2 %   Neutrophils Relative % 76 %   Neutro Abs 9.0 (H) 1.7 - 7.7 K/uL   Lymphocytes Relative 17 %   Lymphs Abs 2.0 0.7 - 4.0 K/uL   Monocytes Relative 6 %   Monocytes Absolute 0.7 0.1 - 1.0 K/uL   Eosinophils Relative 0 %   Eosinophils Absolute 0.0 0.0 - 0.5 K/uL   Basophils Relative 0 %   Basophils Absolute 0.0 0.0 - 0.1 K/uL   Immature Granulocytes 1 %   Abs Immature Granulocytes 0.08 (H) 0.00 - 0.07 K/uL  Ethanol  Result Value Ref Range   Alcohol, Ethyl (B) <10 <10 mg/dL  I-stat chem 8, ED (not at [facility], [facility] or [facility])  Result Value Ref Range   Sodium 137 135 - 145 mmol/L   Potassium 3.3 (L) 3.5 - 5.1 mmol/L   Chloride 104 98 - 111 mmol/L   BUN <3 (L) 6 - 20 mg/dL   Creatinine, Ser 7.82 (L) 0.44 - 1.00 mg/dL   Glucose, Bld 83 70 - 99 mg/dL   Calcium, Ion 9.56 2.13 - 1.40 mmol/L   TCO2 20 (L) 22 - 32 mmol/L   Hemoglobin 11.9 (L) 12.0 - 15.0 g/dL   HCT 08.6 (L) 57.8 - 46.9 %   CT Head Wo Contrast  Result Date: 12/23/2022 CLINICAL DATA:  Blunt poly trauma.  Seizure and strange behavior. EXAM: CT HEAD WITHOUT CONTRAST TECHNIQUE: Contiguous axial images were obtained from the base of the skull through the vertex without intravenous contrast. RADIATION DOSE REDUCTION: This exam was  performed according to the departmental dose-optimization program which includes automated exposure control, adjustment of the mA and/or kV  according to patient size and/or use of iterative reconstruction technique. COMPARISON:  None Available. FINDINGS: Brain: No evidence of acute infarction, hemorrhage, hydrocephalus, extra-axial collection or mass lesion/mass effect. Vascular: No hyperdense vessel or unexpected calcification. Skull: Normal. Negative for fracture or focal lesion. Sinuses/Orbits: No acute finding. IMPRESSION: Normal head CT. Electronically Signed   By: Tiburcio Pea M.D.   On: 12/23/2022 05:51   [place] MFM OB DETAIL +14 WK  Result Date: 12/08/2022 ----------------------------------------------------------------------  OBSTETRICS REPORT                       (Signed Final 12/08/2022 11:25 am) ---------------------------------------------------------------------- Patient Info  ID #:       [id number]                          D.O.B.:  15-Feb-2001 (21 yrs)  Name:       WANELL NEWVILLE                     Visit Date: 12/08/2022 10:18 am ---------------------------------------------------------------------- Performed By  Attending:        Noralee Space MD        Ref. Address:     [address]                                                             [address]  Performed By:     Eden Lathe BS      [facility] RV[facility]                                 [facility] at                                                             [facility]  Referred By:      [facility] ---------------------------------------------------------------------- Orders  #  Description                           Code        Ordered By  1  [place] MFM OB DETAIL +14 WK               40981.19    Tinnie Gens ----------------------------------------------------------------------  #  Order #                     Accession #                Episode #  1  [id number]                   [id number]                  [id number] ---------------------------------------------------------------------- Indications  [redacted] weeks gestation of pregnancy  Z3A.19  Antenatal screening for malformations          Z36.3  Obesity complicating pregnancy, second         O99.212  trimester (pregravid BMI 32)  Low risk NIPS, neg Horizon ---------------------------------------------------------------------- Fetal Evaluation  Num Of Fetuses:         1  Fetal Heart Rate(bpm):  143  Cardiac Activity:       Observed  Presentation:           Cephalic  Placenta:               Anterior  P. Cord Insertion:      Visualized  Amniotic Fluid  AFI FV:      Within normal limits                              Largest Pocket(cm)                              4.1 ---------------------------------------------------------------------- Biometry  BPD:     44.03  mm     G. Age:  19w 2d         39  %    CI:         73.8   %    70 - 86                                                          FL/HC:      18.5   %    16.8 - 19.8  HC:    162.79   mm     G. Age:  19w 0d         19  %    HC/AC:      1.14        1.09 - 1.39  AC:    142.27   mm     G. Age:  19w 4d         45  %    FL/BPD:     68.4   %  FL:       30.1  mm     G. Age:  19w 2d         33  %    FL/AC:      21.2   %    20 - 24  HUM:      27.5  mm     G. Age:  18w 6d         32  %  CER:      19.3  mm     G. Age:  18w 6d         31  %  NFT:       3.3  mm  LV:        6.4  mm  CM:        4.1  mm  Est. FW:     291  gm    0 lb 10 oz      36  % ---------------------------------------------------------------------- OB History  Gravidity:    1         Term:   0  Prem:   0        SAB:   0  TOP:          0       Ectopic:  0        Living: 0 ---------------------------------------------------------------------- Gestational Age  LMP:           19w 4d        Date:  07/24/22                  EDD:   04/30/23  U/S Today:     19w 2d                                        EDD:   05/02/23  Best:          19w 4d      Det. By:  LMP  (07/24/22)          EDD:   04/30/23 ---------------------------------------------------------------------- Anatomy  Cranium:               Appears normal         LVOT:                   Appears normal  Cavum:                 Appears normal         Aortic Arch:            Appears normal  Ventricles:            Appears normal         Ductal Arch:            Appears normal  Choroid Plexus:        Appears normal         Diaphragm:              Appears normal  Cerebellum:            Appears normal         Stomach:                Appears normal, left                                                                        sided  Posterior Fossa:       Appears normal         Abdomen:                Appears normal  Nuchal Fold:           Appears normal         Abdominal Wall:         Appears nml (cord  insert, abd wall)  Face:                  Appears normal         Cord Vessels:           Appears normal (3                         (orbits and profile)                           vessel cord)  Lips:                  Appears normal         Kidneys:                Appear normal  Palate:                Appears normal         Bladder:                Appears normal  Thoracic:              Appears normal         Spine:                  Appears normal  Heart:                 Appears normal         Upper Extremities:      Appears normal                         (4CH, axis, and                         situs)  RVOT:                  Appears normal         Lower Extremities:      Appears normal  Other:  Fetus appears to be a female. Heels/feet and open hands/5th digits          visualized. Nasal bone, lenses, maxilla, mandible and falx visualized          VC, 3VV and 3VTV visualized. ---------------------------------------------------------------------- Cervix Uterus Adnexa  Cervix  Length:            4.5  cm.  Normal appearance by transabdominal scan  Uterus   No abnormality visualized.  Right Ovary  Within normal limits.  Left Ovary  Within normal limits.  Cul De Sac  No free fluid seen.  Adnexa  No abnormality visualized ---------------------------------------------------------------------- Impression  G1 P0. Patient is here for fetal anatomy scan. On cell-free  fetal DNA screening, the risks of aneuploidies are not  increased.  Patient was admitted last week with acute pyelonephritis.  She will be taking neuroprophylaxis in this pregnancy.  We performed fetal anatomy scan. No makers of  aneuploidies or fetal structural defects are seen. Fetal  biometry is consistent with her previously-established dates.  Amniotic fluid is normal and good fetal activity is seen.  Patient understands the limitations of ultrasound in detecting  fetal anomalies. ---------------------------------------------------------------------- Recommendations  -An appointment was made for her to return in 12 weeks for  fetal growth assessment (pyelonephritis in pregnancy). ----------------------------------------------------------------------  Noralee Space, MD Electronically Signed Final Report   12/08/2022 11:25 am ----------------------------------------------------------------------   US RENAL  Result Date: 11/26/2022 CLINICAL DATA:  Seventeen weeks and 6 days pregnant, presenting with flank pain. EXAM: RENAL / URINARY TRACT ULTRASOUND COMPLETE COMPARISON:  None Available. FINDINGS: Right Kidney: Renal measurements: 10.2 cm x 4.8 cm x 5.8 cm = volume: 147.5 mL. Echogenicity within normal limits. No mass or hydronephrosis visualized. Left Kidney: Renal measurements: 11.3 cm x 5.1 cm x 4.5 cm = volume: 134.91 mL. Echogenicity within normal limits. No mass or hydronephrosis visualized. Bladder: The urinary bladder is poorly distended and subsequently limited in evaluation. Other: None. IMPRESSION: Unremarkable renal ultrasound. Electronically Signed   By: Aram Candela M.D.    On: 11/26/2022 17:42    EKG EKG reviewed and is NSR, no prolongation of the QT Radiology CT Head Wo Contrast  Result Date: 12/23/2022 CLINICAL DATA:  Blunt poly trauma.  Seizure and strange behavior. EXAM: CT HEAD WITHOUT CONTRAST TECHNIQUE: Contiguous axial images were obtained from the base of the skull through the vertex without intravenous contrast. RADIATION DOSE REDUCTION: This exam was performed according to the departmental dose-optimization program which includes automated exposure control, adjustment of the mA and/or kV according to patient size and/or use of iterative reconstruction technique. COMPARISON:  None Available. FINDINGS: Brain: No evidence of acute infarction, hemorrhage, hydrocephalus, extra-axial collection or mass lesion/mass effect. Vascular: No hyperdense vessel or unexpected calcification. Skull: Normal. Negative for fracture or focal lesion. Sinuses/Orbits: No acute finding. IMPRESSION: Normal head CT. Electronically Signed   By: Tiburcio Pea M.D.   On: 12/23/2022 05:51    Procedures Procedures    Medications Ordered in ED Medications - No data to display  ED Course/ Medical Decision Making/ [name]                                 Medical Decision Making Throwing things including water and cornered by roommates and then had seizure like activity   Amount and/or Complexity of Data Reviewed Independent Historian: EMS    Details: See above  External Data Reviewed: notes.    Details: Previous notes reviewed  Labs: ordered.    Details: Elevated white count 11.8, low hemoglobin 11.6, normal platelet count. Negative ETOH.  Normal sodium 137, potassium slight low 3.3, normal creatinine  Radiology: ordered and independent interpretation performed.    Details: Negative head CT by me  ECG/medicine tests: ordered and independent interpretation performed. Decision-making details documented in ED Course.  Risk Risk Details: See and cleared by OB rapid response, they  have nothing additional to offer at this time.     Final Clinical Impression(s) / ED Diagnoses Final diagnoses:  Seizure-like activity (HCC)   Signed out to Dr. Jodi Mourning pending alertness and PO challenged.    Rx / DC Orders ED Discharge Orders     None         Ovadia Lopp, MD 12/23/22 7846

## 2022-12-23 NOTE — ED Provider Notes (Signed)
Dr. Selina Cooley with neuro has evaluated the EEG which was normal and MRI normal.  At this time pt is clear for d/c and outpt neuro f/u.   Gwyneth Sprout, MD 12/23/22 1616

## 2022-12-23 NOTE — ED Notes (Signed)
Patient transported to MRI 

## 2022-12-23 NOTE — ED Triage Notes (Signed)
Pt from home by ambulance. Per ems from roommates of pt she woke up and started acting strange. Throwing things at the wall and herself into the wall. Was not responding to her name and when roommates cornered pt she had a seizure and hit her head on a dresser. Then had several mini seizures lasting 10seconds each.. pt c/o sensitivity to light, headache, all extremity numbness and weakness. Hx anxiety/depression, blood sugar 99. 21 weeks prego 1rst pregnancy. A and o x 4.very sleepy.

## 2022-12-23 NOTE — Procedures (Signed)
Patient Name: Julie Rios  MRN: [id number]  Epilepsy Attending: Charlsie Quest  Referring Physician/Provider: Blane Ohara, MD  Date: 12/23/2022 Duration: 25.17 mins  Patient history: 22 yo F with seizure getting eeg to evaluate fr seizure  Level of alertness: Awake  AEDs during EEG study: None  Technical aspects: This EEG study was done with scalp electrodes positioned according to the 10-20 International system of electrode placement. Electrical activity was reviewed with band pass filter of 1-70Hz , sensitivity of 7 uV/mm, display speed of 44mm/sec with a 60Hz  notched filter applied as appropriate. EEG data were recorded continuously and digitally stored.  Video monitoring was available and reviewed as appropriate.  Description: The posterior dominant rhythm consists of 9-10 Hz activity of moderate voltage (25-35 uV) seen predominantly in posterior head regions, symmetric and reactive to eye opening and eye closing.  Hyperventilation and photic stimulation were not performed.     IMPRESSION: This study is within normal limits. No seizures or epileptiform discharges were seen throughout the recording.  A normal interictal EEG does not exclude  the diagnosis of epilepsy.   Roddy Bellamy Annabelle Harman

## 2022-12-24 LAB — URINE CULTURE

## 2023-01-17 ENCOUNTER — Ambulatory Visit (INDEPENDENT_AMBULATORY_CARE_PROVIDER_SITE_OTHER): Payer: MEDICAID | Admitting: Family Medicine

## 2023-01-17 VITALS — BP 111/75 | HR 79 | Wt 164.0 lb

## 2023-01-17 DIAGNOSIS — N12 Tubulo-interstitial nephritis, not specified as acute or chronic: Secondary | ICD-10-CM

## 2023-01-17 DIAGNOSIS — O26899 Other specified pregnancy related conditions, unspecified trimester: Secondary | ICD-10-CM

## 2023-01-17 DIAGNOSIS — F332 Major depressive disorder, recurrent severe without psychotic features: Secondary | ICD-10-CM

## 2023-01-17 DIAGNOSIS — Z3402 Encounter for supervision of normal first pregnancy, second trimester: Secondary | ICD-10-CM

## 2023-01-17 DIAGNOSIS — Z6791 Unspecified blood type, Rh negative: Secondary | ICD-10-CM

## 2023-01-17 DIAGNOSIS — O219 Vomiting of pregnancy, unspecified: Secondary | ICD-10-CM

## 2023-01-17 MED ORDER — METOCLOPRAMIDE HCL 10 MG PO TABS: 10.0000 mg | ORAL_TABLET | Freq: Three times a day (TID) | ORAL | 2 refills | Status: DC

## 2023-01-17 NOTE — Progress Notes (Signed)
   PRENATAL VISIT NOTE  Subjective:  Julie Rios is a 22 y.o. G1P0 at [redacted]w[redacted]d being seen today for ongoing prenatal care.  She is currently monitored for the following issues for this high-risk pregnancy and has Suicidal ideation; MDD (major depressive disorder), recurrent severe, without psychosis (HCC); Generalized anxiety disorder; Encounter for supervision of normal first pregnancy; Nausea and vomiting in pregnancy; Rh negative state in antepartum period; Asymptomatic bacteriuria during pregnancy in first trimester; Pyelonephritis; and Pyelonephritis affecting pregnancy in second trimester on their problem list.  Patient reports no complaints.  Contractions: Not present. Vag. Bleeding: None.  Movement: Present. Denies leaking of fluid.   The following portions of the patient's history were reviewed and updated as appropriate: allergies, current medications, past family history, past medical history, past social history, past surgical history and problem list.   Objective:   Vitals:   01/17/23 1535  BP: 111/75  Pulse: 79  Weight: 164 lb (74.4 kg)    Fetal Status: Fetal Heart Rate (bpm): 142   Movement: Present     General:  Alert, oriented and cooperative. Patient is in no acute distress.  Skin: Skin is warm and dry. No rash noted.   Cardiovascular: Normal heart rate noted  Respiratory: Normal respiratory effort, no problems with respiration noted  Abdomen: Soft, gravid, appropriate for gestational age.  Pain/Pressure: Absent     Pelvic: Cervical exam deferred        Extremities: Normal range of motion.  Edema: Trace  Mental Status: Normal mood and affect. Normal behavior. Normal judgment and thought content.   Assessment and Plan:  Pregnancy: G1P0 at [redacted]w[redacted]d 1. MDD (major depressive disorder), recurrent severe, without psychosis (HCC) Was on Prozac and wellbutrin Seen by [facility] but was kicked out of practice Not on medications currently  Had auditory hallucinations and stopped  medications She is very anxious appearing in the room today  2. Encounter for supervision of normal first pregnancy in second trimester Was see for seizure like activity on 8/15-- cornered by roommates and threw water, ran into a wall. EEG was WNL Plan from ER was outpatient neuro follow up and was referred but they made three attempts to contact patient and left VM per referral documentation.   3. Rh negative state in antepartum period Rhogam at next visit  4. Pyelonephritis Taking keflex Ucx today  5. Nausea and vomiting in pregnancy Using THC to help with nausea, using 2 times per day.Uses zofran daily and phenergan prn Recommended starting reglan to help with symptoms TWG is  -17 lb (-7.711 kg) which is concerning for HG Consider IV fluid outpatient Dicussed with patient hospital policy for [facility].   Preterm labor symptoms and general obstetric precautions including but not limited to vaginal bleeding, contractions, leaking of fluid and fetal movement were reviewed in detail with the patient. Please refer to After Visit Summary for other counseling recommendations.   Return in about 2 weeks (around 01/31/2023) for Routine prenatal care.  Future Appointments  Date Time Provider Department Center  02/01/2023  8:15 AM [facility]  02/01/2023  8:35 AM [place] Bing, MD [facility]  02/22/2023  1:30 PM [place] Bing, MD [facility]  03/02/2023  9:00 AM [facility]  03/08/2023  1:30 PM Anyanwu, Jethro Bastos, MD [facility]    Federico Flake, MD

## 2023-01-17 NOTE — Progress Notes (Signed)
[facility]   Hospital visit on 12/23/22 for seizure like activity   Pt states she is doing better.  CC: Not able to eat. Concerned about UTI.  Pt was not able to get ensure

## 2023-01-18 ENCOUNTER — Encounter: Payer: Self-pay | Admitting: Family Medicine

## 2023-01-18 ENCOUNTER — Other Ambulatory Visit: Payer: Self-pay | Admitting: *Deleted

## 2023-01-18 DIAGNOSIS — O219 Vomiting of pregnancy, unspecified: Secondary | ICD-10-CM

## 2023-01-18 MED ORDER — ENSURE COMPLETE SHAKE PO LIQD
1.0000 | Freq: Three times a day (TID) | ORAL | 3 refills | Status: AC
Start: 2023-01-18 — End: ?

## 2023-02-01 ENCOUNTER — Telehealth: Payer: Self-pay

## 2023-02-01 ENCOUNTER — Ambulatory Visit: Payer: MEDICAID | Admitting: Obstetrics and Gynecology

## 2023-02-01 ENCOUNTER — Other Ambulatory Visit: Payer: MEDICAID

## 2023-02-01 VITALS — BP 107/74 | HR 88 | Wt 166.0 lb

## 2023-02-01 DIAGNOSIS — Z3A27 27 weeks gestation of pregnancy: Secondary | ICD-10-CM

## 2023-02-01 DIAGNOSIS — Z6791 Unspecified blood type, Rh negative: Secondary | ICD-10-CM | POA: Diagnosis not present

## 2023-02-01 DIAGNOSIS — F32A Depression, unspecified: Secondary | ICD-10-CM

## 2023-02-01 DIAGNOSIS — O360921 Maternal care for other rhesus isoimmunization, second trimester, fetus 1: Secondary | ICD-10-CM | POA: Diagnosis not present

## 2023-02-01 DIAGNOSIS — Z1339 Encounter for screening examination for other mental health and behavioral disorders: Secondary | ICD-10-CM

## 2023-02-01 DIAGNOSIS — O26892 Other specified pregnancy related conditions, second trimester: Secondary | ICD-10-CM

## 2023-02-01 DIAGNOSIS — Z3400 Encounter for supervision of normal first pregnancy, unspecified trimester: Secondary | ICD-10-CM

## 2023-02-01 DIAGNOSIS — Z87440 Personal history of urinary (tract) infections: Secondary | ICD-10-CM

## 2023-02-01 DIAGNOSIS — O219 Vomiting of pregnancy, unspecified: Secondary | ICD-10-CM

## 2023-02-01 DIAGNOSIS — R569 Unspecified convulsions: Secondary | ICD-10-CM

## 2023-02-01 DIAGNOSIS — F419 Anxiety disorder, unspecified: Secondary | ICD-10-CM

## 2023-02-01 DIAGNOSIS — Z8759 Personal history of other complications of pregnancy, childbirth and the puerperium: Secondary | ICD-10-CM

## 2023-02-01 MED ORDER — RHO D IMMUNE GLOBULIN 1500 UNIT/2ML IJ SOSY
300.0000 ug | PREFILLED_SYRINGE | Freq: Once | INTRAMUSCULAR | Status: AC
Start: 2023-02-01 — End: 2023-02-01
  Administered 2023-02-01: 300 ug via INTRAMUSCULAR

## 2023-02-01 MED ORDER — ONDANSETRON 4 MG PO TBDP: 4.0000 mg | ORAL_TABLET | Freq: Four times a day (QID) | ORAL | 1 refills | Status: DC | PRN

## 2023-02-01 NOTE — Progress Notes (Signed)
ROB 2hr GTT today    Needs Rhogam today.

## 2023-02-01 NOTE — Telephone Encounter (Signed)
Patients ultrasound appointment at [facility] on 03/02/23 needs to be move due to clinic being closed that day.    Left message for patient to call office to reschedule.  Will also sen MyChart message

## 2023-02-01 NOTE — Progress Notes (Signed)
PRENATAL VISIT NOTE  Subjective:  Julie Rios is a 22 y.o. G1P0 at [redacted]w[redacted]d being seen today for ongoing prenatal care.  She is currently monitored for the following issues for this high-risk pregnancy and has Suicidal ideation; MDD (major depressive disorder), recurrent severe, without psychosis (HCC); Generalized anxiety disorder; Encounter for supervision of normal first pregnancy; Nausea and vomiting in pregnancy; Rh negative state in antepartum period; Asymptomatic bacteriuria during pregnancy in first trimester; Pyelonephritis; Pyelonephritis affecting pregnancy in second trimester; and Seizure-like activity (HCC) on their problem list.  Patient reports  see below .  Contractions: Not present. Vag. Bleeding: None.  Movement: Present. Denies leaking of fluid.   The following portions of the patient's history were reviewed and updated as appropriate: allergies, current medications, past family history, past medical history, past social history, past surgical history and problem list.   Objective:   Vitals:   02/01/23 0907  BP: 107/74  Pulse: 88  Weight: 166 lb (75.3 kg)    Fetal Status: Fetal Heart Rate (bpm): 142   Movement: Present     General:  Alert, oriented and cooperative. Patient is in no acute distress.  Skin: Skin is warm and dry. No rash noted.   Cardiovascular: Normal heart rate noted  Respiratory: Normal respiratory effort, no problems with respiration noted  Abdomen: Soft, gravid, appropriate for gestational age.  Pain/Pressure: Present     Pelvic: Cervical exam deferred        Extremities: Normal range of motion.  Edema: None  Mental Status: Normal mood and affect. Normal behavior. Normal judgment and thought content.   Assessment and Plan:  Pregnancy: G1P0 at [redacted]w[redacted]d 1. [redacted] weeks gestation of pregnancy 28wk labs today  2. History of pyelonephritis during pregnancy Confirms taking keflex suppression w/o issue. Last ucx negative  3. Rh negative state in antepartum  period, second trimester Rhogam and ab screen today  4. Seizure-like activity [facility]) August 2024 with negative imaging and EEG in the ED. Patient confirms she got voicemails for outpatient neuro follow up but didn't think it necessary. I told her I recommend calling them to set up an appointment  5. Nausea and vomiting in pregnancy Weight up 2lbs. Continue on current medications: pepcid, reglan, compazine. Zofran ODT refill sent in  6. Anxiety and depression Patient last seen by [facility] 03/2022 and she missed her 04/2022 f/u visit. Patient self d/c'ed prozac and wellbutrin during the pregnancy. I told her to reach out to her [facility] provider and I will CC her on this note to see about getting patient in for follow up.   Patient states suicidal thoughts were last year. She denies anything current, no plan. I d/w her emergency resources if she has these thoughts.      02/01/2023    9:08 AM 10/27/2022   10:31 AM 09/23/2021    3:50 PM  Depression screen PHQ 2/9  Decreased Interest 0 0 1  Down, Depressed, Hopeless 2 0 0  PHQ - 2 Score 2 0 1  Altered sleeping 1 2   Tired, decreased energy 1 2   Change in appetite 2 0   Feeling bad or failure about yourself  3 2   Trouble concentrating 1 0   Moving slowly or fidgety/restless 2 0   Suicidal thoughts 2 0   PHQ-9 Score 14 6   Difficult doing work/chores  Not difficult at all         02/01/2023    9:11 AM 10/27/2022   10:35 AM  09/23/2021    3:51 PM 07/23/2021    2:51 PM  GAD 7 : Generalized Anxiety Score  Nervous, Anxious, on Edge 1 2 1 2   Control/stop worrying 3 3 1 2   Worry too much - different things 3 2 2 3   Trouble relaxing 3 3 3 3   Restless 2 2 2 3   Easily annoyed or irritable 3 2 2 2   Afraid - awful might happen 3 2 1 1   Total GAD 7 Score 18 16 12 16   Anxiety Difficulty   Not difficult at all Somewhat difficult     Preterm labor symptoms and general obstetric precautions including but not limited to vaginal bleeding, contractions,  leaking of fluid and fetal movement were reviewed in detail with the patient. Please refer to After Visit Summary for other counseling recommendations.   No follow-ups on file.  Future Appointments  Date Time Provider Department Center  02/22/2023  1:30 PM [place] Bing, MD [facility]  03/02/2023  9:00 AM [facility]  03/08/2023  1:30 PM Anyanwu, Jethro Bastos, MD [facility]  03/22/2023  1:30 PM [place] Bing, MD [facility]  04/05/2023  1:30 PM Anyanwu, Jethro Bastos, MD [facility]    [place] Bing, MD

## 2023-02-02 ENCOUNTER — Telehealth: Payer: Self-pay

## 2023-02-02 ENCOUNTER — Encounter: Payer: Self-pay | Admitting: Obstetrics and Gynecology

## 2023-02-02 ENCOUNTER — Other Ambulatory Visit: Payer: Self-pay | Admitting: *Deleted

## 2023-02-02 DIAGNOSIS — O24410 Gestational diabetes mellitus in pregnancy, diet controlled: Secondary | ICD-10-CM

## 2023-02-02 DIAGNOSIS — O24419 Gestational diabetes mellitus in pregnancy, unspecified control: Secondary | ICD-10-CM | POA: Insufficient documentation

## 2023-02-02 LAB — CBC
Hematocrit: 38.2 % (ref 34.0–46.6)
Hemoglobin: 12.3 g/dL (ref 11.1–15.9)
MCH: 29.2 pg (ref 26.6–33.0)
MCHC: 32.2 g/dL (ref 31.5–35.7)
MCV: 91 fL (ref 79–97)
Platelets: 419 10*3/uL (ref 150–450)
RBC: 4.21 x10E6/uL (ref 3.77–5.28)
RDW: 13.5 % (ref 11.7–15.4)
WBC: 11.6 10*3/uL — ABNORMAL HIGH (ref 3.4–10.8)

## 2023-02-02 LAB — GLUCOSE TOLERANCE, 2 HOURS W/ 1HR
Glucose, 1 hour: 180 mg/dL — ABNORMAL HIGH (ref 70–179)
Glucose, 2 hour: 135 mg/dL (ref 70–152)
Glucose, Fasting: 90 mg/dL (ref 70–91)

## 2023-02-02 LAB — HIV ANTIBODY (ROUTINE TESTING W REFLEX): HIV Screen 4th Generation wRfx: NONREACTIVE

## 2023-02-02 LAB — ANTIBODY SCREEN: Antibody Screen: NEGATIVE

## 2023-02-02 LAB — RPR: RPR Ser Ql: NONREACTIVE

## 2023-02-02 MED ORDER — ACCU-CHEK GUIDE W/DEVICE KIT: 1.0000 | PACK | Freq: Four times a day (QID) | 0 refills | Status: DC

## 2023-02-02 MED ORDER — ACCU-CHEK GUIDE VI STRP: ORAL_STRIP | 12 refills | Status: DC

## 2023-02-02 MED ORDER — ACCU-CHEK SOFTCLIX LANCETS MISC: 1.0000 | Freq: Four times a day (QID) | 12 refills | Status: DC

## 2023-02-02 NOTE — Telephone Encounter (Signed)
Left message for patient to call office to reschedule Follow up ultrasound at [facility] from 10/23 - office will be closed

## 2023-02-03 ENCOUNTER — Encounter: Payer: Self-pay | Admitting: Dietician

## 2023-02-10 MED ORDER — ENSURE COMPLETE SHAKE PO LIQD
1.0000 | Freq: Three times a day (TID) | ORAL | 3 refills | Status: DC
Start: 2023-02-10 — End: 2023-02-11

## 2023-02-11 MED ORDER — ENSURE COMPLETE SHAKE PO LIQD
1.0000 | Freq: Three times a day (TID) | ORAL | 3 refills | Status: DC
Start: 2023-02-11 — End: 2023-03-09

## 2023-02-16 ENCOUNTER — Encounter: Payer: Self-pay | Admitting: Dietician

## 2023-02-16 ENCOUNTER — Encounter: Payer: MEDICAID | Attending: Obstetrics and Gynecology | Admitting: Dietician

## 2023-02-16 ENCOUNTER — Other Ambulatory Visit: Payer: Self-pay | Admitting: Obstetrics and Gynecology

## 2023-02-16 DIAGNOSIS — O24419 Gestational diabetes mellitus in pregnancy, unspecified control: Secondary | ICD-10-CM | POA: Insufficient documentation

## 2023-02-16 DIAGNOSIS — O2303 Infections of kidney in pregnancy, third trimester: Secondary | ICD-10-CM

## 2023-02-16 NOTE — Progress Notes (Signed)
Patient was seen on 02/16/2023 for Gestational Diabetes self-management class at the [facility]. The following learning objectives were met by the patient during this course:  States the definition of Gestational Diabetes States why dietary management is important in controlling blood glucose Describes the effects each nutrient has on blood glucose levels Demonstrates ability to create a balanced meal plan Demonstrates carbohydrate counting  States when to check blood glucose levels Demonstrates proper blood glucose monitoring techniques States the effect of stress and exercise on blood glucose levels States the importance of limiting caffeine and abstaining from alcohol and smoking  Blood glucose monitor: Pt present today with Accu Chek Guide Fasting 83-95 mg/dL  2 hour post prandial: 107-124 with a elevation of 194 most likely related to food/beverages choices  Patient instructed to monitor glucose levels: QID FBS: 60 - <90 1 hour: <140 2 hour: <120  *Patient received handouts: Nutrition Diabetes and Pregnancy Carbohydrate Counting List Blood glucose log Snack ideas for diabetes during pregnancy  Patient will be seen for follow-up as needed.

## 2023-02-22 ENCOUNTER — Ambulatory Visit (INDEPENDENT_AMBULATORY_CARE_PROVIDER_SITE_OTHER): Payer: MEDICAID | Admitting: Obstetrics and Gynecology

## 2023-02-22 ENCOUNTER — Encounter: Payer: Self-pay | Admitting: Obstetrics and Gynecology

## 2023-02-22 VITALS — BP 135/84 | HR 111 | Wt 166.0 lb

## 2023-02-22 DIAGNOSIS — O24415 Gestational diabetes mellitus in pregnancy, controlled by oral hypoglycemic drugs: Secondary | ICD-10-CM

## 2023-02-22 DIAGNOSIS — O26899 Other specified pregnancy related conditions, unspecified trimester: Secondary | ICD-10-CM

## 2023-02-22 DIAGNOSIS — Z3A30 30 weeks gestation of pregnancy: Secondary | ICD-10-CM

## 2023-02-22 DIAGNOSIS — O2302 Infections of kidney in pregnancy, second trimester: Secondary | ICD-10-CM

## 2023-02-22 DIAGNOSIS — R569 Unspecified convulsions: Secondary | ICD-10-CM

## 2023-02-22 DIAGNOSIS — Z6791 Unspecified blood type, Rh negative: Secondary | ICD-10-CM

## 2023-02-22 DIAGNOSIS — F419 Anxiety disorder, unspecified: Secondary | ICD-10-CM

## 2023-02-22 DIAGNOSIS — F32A Depression, unspecified: Secondary | ICD-10-CM

## 2023-02-22 MED ORDER — ACCU-CHEK GUIDE VI STRP: ORAL_STRIP | 3 refills | Status: DC

## 2023-02-22 NOTE — Progress Notes (Signed)
   PRENATAL VISIT NOTE  Subjective:  Julie Rios is a 22 y.o. G1P0 at [redacted]w[redacted]d being seen today for ongoing prenatal care.  She is currently monitored for the following issues for this high-risk pregnancy and has Suicidal ideation; MDD (major depressive disorder), recurrent severe, without psychosis (HCC); Generalized anxiety disorder; Encounter for supervision of normal first pregnancy; Nausea and vomiting in pregnancy; Rh negative state in antepartum period; Asymptomatic bacteriuria during pregnancy in first trimester; Pyelonephritis; Pyelonephritis affecting pregnancy in second trimester; Seizure-like activity (HCC); and GDM (gestational diabetes mellitus) on their problem list.  Patient reports no complaints.  Contractions: Not present. Vag. Bleeding: None.  Movement: Present. Denies leaking of fluid.   The following portions of the patient's history were reviewed and updated as appropriate: allergies, current medications, past family history, past medical history, past social history, past surgical history and problem list.   Objective:   Vitals:   02/22/23 1330  BP: 135/84  Pulse: (!) 111  Weight: 166 lb (75.3 kg)    Fetal Status: Fetal Heart Rate (bpm): 140   Movement: Present     General:  Alert, oriented and cooperative. Patient is in no acute distress.  Skin: Skin is warm and dry. No rash noted.   Cardiovascular: Normal heart rate noted  Respiratory: Normal respiratory effort, no problems with respiration noted  Abdomen: Soft, gravid, appropriate for gestational age.  Pain/Pressure: Present     Pelvic: Cervical exam deferred        Extremities: Normal range of motion.  Edema: None  Mental Status: Normal mood and affect. Normal behavior. Normal judgment and thought content.   Assessment and Plan:  Pregnancy: G1P0 at [redacted]w[redacted]d 1. [redacted] weeks gestation of pregnancy D/w pt re: birth control next visit  2. Gestational diabetes mellitus (GDM) in third trimester controlled on diet  controlled A few slightly elevated AM fasting but may not be true fastings; normal post prandial numbers. Continue with diet controlled. F/u surveillance growth u/s later this month  3. Pyelonephritis affecting pregnancy in second trimester Continue keflex qhs  4. Seizure-like activity (HCC) No issues; see last visit  5. Anxiety and depression No issues currently  6. Rh negative state in antepartum period S/p rhogam   Preterm labor symptoms and general obstetric precautions including but not limited to vaginal bleeding, contractions, leaking of fluid and fetal movement were reviewed in detail with the patient. Please refer to After Visit Summary for other counseling recommendations.   No follow-ups on file.  Future Appointments  Date Time Provider Department Center  03/07/2023  3:00 PM [facility]  03/09/2023  8:35 AM Reva Bores, MD [facility]  03/11/2023 11:00 AM [place], Shawn Route [facility] None  03/23/2023  1:30 PM Reva Bores, MD [facility]  04/06/2023  1:50 PM Reva Bores, MD [facility]  04/12/2023  1:30 PM Anyanwu, Jethro Bastos, MD [facility]  04/19/2023  1:30 PM Constant, Gigi Gin, MD [facility]  04/26/2023  1:30 PM Anyanwu, Jethro Bastos, MD [facility]    [place] Bing, MD

## 2023-02-22 NOTE — Progress Notes (Signed)
ROB   Needs refill on test strips.   *Pt needs help getting ensure Rx needs to have dx of Hyperemesis. Needs to be printed. Pt has been trying to get Ensure Rx filled for a while now. Each time she has been told by [facility] Rx we sent is not correct.

## 2023-03-02 ENCOUNTER — Ambulatory Visit: Payer: MEDICAID

## 2023-03-07 ENCOUNTER — Ambulatory Visit: Payer: MEDICAID | Attending: Obstetrics and Gynecology

## 2023-03-07 ENCOUNTER — Other Ambulatory Visit: Payer: Self-pay

## 2023-03-07 VITALS — BP 113/72 | HR 79 | Temp 97.7°F | Resp 18 | Ht 64.0 in | Wt 162.6 lb

## 2023-03-07 DIAGNOSIS — Z362 Encounter for other antenatal screening follow-up: Secondary | ICD-10-CM | POA: Insufficient documentation

## 2023-03-07 DIAGNOSIS — O24410 Gestational diabetes mellitus in pregnancy, diet controlled: Secondary | ICD-10-CM | POA: Diagnosis not present

## 2023-03-07 DIAGNOSIS — O219 Vomiting of pregnancy, unspecified: Secondary | ICD-10-CM

## 2023-03-07 DIAGNOSIS — R8271 Bacteriuria: Secondary | ICD-10-CM

## 2023-03-07 DIAGNOSIS — Z6791 Unspecified blood type, Rh negative: Secondary | ICD-10-CM

## 2023-03-07 DIAGNOSIS — Z3402 Encounter for supervision of normal first pregnancy, second trimester: Secondary | ICD-10-CM

## 2023-03-07 DIAGNOSIS — O99213 Obesity complicating pregnancy, third trimester: Secondary | ICD-10-CM | POA: Diagnosis not present

## 2023-03-07 DIAGNOSIS — E669 Obesity, unspecified: Secondary | ICD-10-CM

## 2023-03-07 DIAGNOSIS — O2303 Infections of kidney in pregnancy, third trimester: Secondary | ICD-10-CM | POA: Diagnosis not present

## 2023-03-07 DIAGNOSIS — Z3A32 32 weeks gestation of pregnancy: Secondary | ICD-10-CM | POA: Insufficient documentation

## 2023-03-08 ENCOUNTER — Encounter: Payer: MEDICAID | Admitting: Obstetrics & Gynecology

## 2023-03-09 ENCOUNTER — Ambulatory Visit (INDEPENDENT_AMBULATORY_CARE_PROVIDER_SITE_OTHER): Payer: MEDICAID | Admitting: Family Medicine

## 2023-03-09 ENCOUNTER — Encounter: Payer: Self-pay | Admitting: Family Medicine

## 2023-03-09 VITALS — BP 104/69 | HR 92 | Wt 165.0 lb

## 2023-03-09 DIAGNOSIS — O26899 Other specified pregnancy related conditions, unspecified trimester: Secondary | ICD-10-CM

## 2023-03-09 DIAGNOSIS — Z6791 Unspecified blood type, Rh negative: Secondary | ICD-10-CM

## 2023-03-09 DIAGNOSIS — Z3403 Encounter for supervision of normal first pregnancy, third trimester: Secondary | ICD-10-CM

## 2023-03-09 DIAGNOSIS — O219 Vomiting of pregnancy, unspecified: Secondary | ICD-10-CM

## 2023-03-09 DIAGNOSIS — O24415 Gestational diabetes mellitus in pregnancy, controlled by oral hypoglycemic drugs: Secondary | ICD-10-CM

## 2023-03-09 DIAGNOSIS — Z3A32 32 weeks gestation of pregnancy: Secondary | ICD-10-CM

## 2023-03-09 MED ORDER — GLUCERNA SHAKE PO LIQD
237.0000 mL | Freq: Three times a day (TID) | ORAL | 3 refills | Status: DC
Start: 2023-03-09 — End: 2023-04-18

## 2023-03-09 NOTE — Progress Notes (Signed)
   PRENATAL VISIT NOTE  Subjective:  Julie Rios is a 22 y.o. G1P0 at [redacted]w[redacted]d being seen today for ongoing prenatal care.  She is currently monitored for the following issues for this high-risk pregnancy and has Suicidal ideation; MDD (major depressive disorder), recurrent severe, without psychosis (HCC); Generalized anxiety disorder; Encounter for supervision of normal first pregnancy; Nausea and vomiting in pregnancy; Rh negative state in antepartum period; Asymptomatic bacteriuria during pregnancy in first trimester; Pyelonephritis; Pyelonephritis affecting pregnancy in second trimester; Seizure-like activity (HCC); and GDM (gestational diabetes mellitus) on their problem list.  Patient reports heartburn, nausea, vomiting, and insomnia .  Contractions: Not present. Vag. Bleeding: None.  Movement: Present. Denies leaking of fluid.   The following portions of the patient's history were reviewed and updated as appropriate: allergies, current medications, past family history, past medical history, past social history, past surgical history and problem list.   Objective:   Vitals:   03/09/23 0837  BP: 104/69  Pulse: 92  Weight: 165 lb (74.8 kg)    Fetal Status: Fetal Heart Rate (bpm): 140 Fundal Height: 29 cm Movement: Present     General:  Alert, oriented and cooperative. Patient is in no acute distress.  Skin: Skin is warm and dry. No rash noted.   Cardiovascular: Normal heart rate noted  Respiratory: Normal respiratory effort, no problems with respiration noted  Abdomen: Soft, gravid, appropriate for gestational age.  Pain/Pressure: Absent     Pelvic: Cervical exam deferred        Extremities: Normal range of motion.  Edema: None  Mental Status: Normal mood and affect. Normal behavior. Normal judgment and thought content.   Assessment and Plan:  Pregnancy: G1P0 at [redacted]w[redacted]d 1. Gestational diabetes mellitus (GDM) in third trimester controlled on diet FBS < 100 2 hour post meals are mostly  in with a few dietary indiscretion  2. Rh negative state in antepartum period S/p rhogam and pp if indicated  3. Encounter for supervision of normal first pregnancy in third trimester Insomnia - good sleep hygiene discussed, trial of antihistamine. Pepcid at hs and increase to PPI if needed.  4. [redacted] weeks gestation of pregnancy   5. Nausea and vomiting in pregnancy Switch from Ensure to Glucerna if insurance helps, continue anti-emetics. - feeding supplement, GLUCERNA SHAKE, (GLUCERNA SHAKE) LIQD; Take 237 mLs by mouth 3 (three) times daily between meals. Dispense 1 month supply  Dispense: 237 mL; Refill: 3  Preterm labor symptoms and general obstetric precautions including but not limited to vaginal bleeding, contractions, leaking of fluid and fetal movement were reviewed in detail with the patient. Please refer to After Visit Summary for other counseling recommendations.   Return in 2 weeks (on 03/23/2023).  Future Appointments  Date Time Provider Department Center  03/11/2023 11:00 AM D[place] Gip [facility] None  03/23/2023  1:30 PM Reva Bores, MD [facility]  04/06/2023  1:50 PM Reva Bores, MD [facility]  04/13/2023  2:30 PM Reva Bores, MD [facility]  04/18/2023  9:00 AM [facility]  04/20/2023  1:30 PM Reva Bores, MD [facility]  04/27/2023  1:30 PM Reva Bores, MD [facility]    Reva Bores, MD

## 2023-03-09 NOTE — Progress Notes (Signed)
ROB   GDM pt has Blood sugar log on phone.  *Pt has not been able to get Rx for Ensure. Paying out of pocket and cannot find sugar free.   CC: insomnia

## 2023-03-10 ENCOUNTER — Other Ambulatory Visit: Payer: Self-pay | Admitting: Obstetrics

## 2023-03-10 DIAGNOSIS — O24410 Gestational diabetes mellitus in pregnancy, diet controlled: Secondary | ICD-10-CM

## 2023-03-10 NOTE — Progress Notes (Signed)
BH MD Outpatient Progress Note  03/11/2023 12:35 PM Julie Rios  MRN:  [id number]  Assessment:  Julie Rios presents for follow-up evaluation. Today, 03/11/23, patient is re-establishing care after she was last seen in clinic Nov 2023. She has been off psychotropics since that time. She is currently pregnant and in her 3rd trimester and notes stress related to various complications in pregnancy including persistent N/V and GDM. She reports moments of increased irritability, anxiety especially in anticipation of arrival of baby, and occasional sadness however overall reports these moments are brief and in general feels excited about arrival of baby girl and in state of good mood or euthymia. It appears that current symptoms fall within realm of normal adjustment to current pregnancy however given history of depression will monitor carefully for recurrence of these symptoms that would warrant restarting SSRI. She would benefit from re-establishing in psychotherapy for further support and was amenable to referral. Given significant difficulty sleeping, recommended OTC doxylamine; this may additionally be helpful for N/V and reviewed recommendation to abstain from [facility] use given risks in pregnancy.   Plan to maintain close follow-up with RTC in 5 weeks by video.  Identifying Information: Julie Rios is a 22 y.o. G1P0 female currently pregnant at [redacted]w[redacted]d with a history of MDD and GAD who is an established patient with [facility] participating in follow-up via video conferencing.   Plan:  # MDD currently in remission  GAD Past medication trials: Wellbutrin XL, Prozac Status of problem: stable Interventions: -- Will defer restarting SSRI at this time given current symptoms of anxiety and irritability appear to fall within normal adjustment -- START doxylamine 25 mg nightly PRN sleep; can also be helpful for N/V in pregnancy -- Maternal use of ????l?mine in combination with  pyridoxine during pregnancy has not been shown to increase the baseline risk of major malformations.  -- Counseled against THC use -- Previously seen for individual psychotherapy with Deloris Ping, LCSW; referral placed today  Patient was given contact information for behavioral health clinic and was instructed to call 911 for emergencies.   Subjective:  Chief Complaint:  Chief Complaint  Patient presents with   Medication Management    Interval History:   Chart review: -- Last seen in our clinic 03/15/22 -- Seen in ED 12/23/22 for episode of seizure like activity. EEG and MRI brain wnl.  -- Last seen by Ob/Gyn 03/09/23: [redacted]w[redacted]d; monitored for GDM diet controlled and N/V in pregnancy  Patient reports she is [redacted] weeks pregnant and has been "going through a lot" since the first trimester. This is her first pregnancy - will be having a girl.  Physically, has been dealing with some N/V since the beginning of the pregnancy. Has actually lost weight in the pregnancy due to trouble with PO intake. Plan for her to be induced at 39 weeks due to GDM.  Has been off Wellbutrin and Prozac for the past year.  Mood has been "a bit more irritated" than usual - can be by little things and occurs on a daily basis but not persistent. Reports elevated level of stress and anxiety when roommate/friend was living with them but wasn't a good fit and led to more tension in the home. She has since moved out and this has helped with acute stress. Reports elevated anxiety in 3rd trimester in anticipation of labor/delivery and preparations for baby.   Endorses moments in which mood has been low especially when thinking about her parents not being around .  Sometimes feels "numb." May push others away which then worsens symptoms. Typically lasts a few hours at a time and then dissipates and feels happy again. In general reports feeling "overjoyed" about the pregnancy and arrival of baby. Denies passive/active SI, HI, AVH.    Endorses poor sleep due to discomfort in pregnancy. Getting on average 3 hours at night and then naps during the day. Reports feeling fatigued on nights she doesn't get sleep. Has not tried anything for sleep outside of THC-A. Previously had never smoked but has found THC helpful for nausea/sleep. Smoking once a day. Understands this is not recommended and expresses guilt over use but feels options for relief are limited. She denies trials of OTC sleep aids and was amenable to trial of doxylamine 25 mg. Discussed importance of protecting sleep as much as possible in postpartum period and her/partner taking shifts overnight.  Denies any other substance use or tobacco use.   Lives with boyfriend (father of baby) and considers him supportive. Feels safe in relationship. He has been attending appts with her. Notes boyfriend's mom and patient's siblings have been a big support as well.   She plans to breastfeed but may consider both breastmilk/formula.   Expresses interest in psychotherapy for further support. Amenable to continued monitoring of mood/anxiety symptoms for time being - not feeling particularly interested in starting medication but rather wants to be plugged in so that she can restart medication if needed  Visit Diagnosis:    ICD-10-CM   1. Generalized anxiety disorder  F41.1     2. Episode of recurrent major depressive disorder, unspecified depression episode severity (HCC)  F33.9       Past Psychiatric History:  Diagnoses: MDD, GAD Medication trials: Wellbutrin XL 150 mg Substance use:  -- Reports use of THC-A nightly to help with insomnia and N/V in pregnancy -- Denies etoh, tobacco, or illicit drug use   Past Medical History:  Past Medical History:  Diagnosis Date   Anxiety    Depression    MDD (major depressive disorder), single episode, moderate (HCC)     Past Surgical History:  Procedure Laterality Date   NO PAST SURGERIES      Family Psychiatric History:   Sister - Depression, borderline personality   Family History:  Family History  Problem Relation Age of Onset   Diabetes Mother    Cirrhosis Mother    Diabetes Father     Social History:  Social History   Socioeconomic History   Marital status: Single    Spouse name: Casimiro Needle   Number of children: Not on file   Years of education: Not on file   Highest education level: Not on file  Occupational History   Not on file  Tobacco Use   Smoking status: Never    Passive exposure: Yes   Smokeless tobacco: Never  Vaping Use   Vaping status: Some Days   Substances: Nicotine  Substance and Sexual Activity   Alcohol use: No   Drug use: Yes    Types: Other-see comments    Comment: reports use of THC-A approx. nightly   Sexual activity: Yes    Birth control/protection: None  Other Topics Concern   Not on file  Social History Narrative   Not on file   Social Determinants of Health   Financial Resource Strain: Not on file  Food Insecurity: Food Insecurity Present (02/16/2023)   Hunger Vital Sign    Worried About Running Out of Food in the Last Year:  Sometimes true    Ran Out of Food in the Last Year: Sometimes true  Transportation Needs: No Transportation Needs (11/26/2022)   PRAPARE - Administrator, Civil Service (Medical): No    Lack of Transportation (Non-Medical): No  Physical Activity: Not on file  Stress: Not on file  Social Connections: Unknown (09/22/2021)   [facility]   Social Network    Social Network: Not on file    Allergies: No Known Allergies  Current Medications: Current Outpatient Medications  Medication Sig Dispense Refill   Accu-Chek Softclix Lancets lancets 1 each by Other route 4 (four) times daily. 100 each 12   Blood Glucose Monitoring Suppl (ACCU-CHEK GUIDE) w/Device KIT 1 Device by Does not apply route 4 (four) times daily. 1 kit 0   cephALEXin (KEFLEX) 500 MG capsule Take 1 capsule (500 mg total) by  mouth at bedtime. Continue this for the rest of the pregnancy 30 capsule 4   famotidine (PEPCID) 20 MG tablet Take 1 tablet (20 mg total) by mouth 2 (two) times daily. 60 tablet 3   feeding supplement, GLUCERNA SHAKE, (GLUCERNA SHAKE) LIQD Take 237 mLs by mouth 3 (three) times daily between meals. Dispense 1 month supply 237 mL 3   glucose blood (ACCU-CHEK GUIDE) test strip Use to check blood sugars four times a day was instructed 100 each 3   metoCLOPramide (REGLAN) 10 MG tablet Take 1 tablet (10 mg total) by mouth 3 (three) times daily before meals. 90 tablet 2   ondansetron (ZOFRAN-ODT) 4 MG disintegrating tablet Take 1 tablet (4 mg total) by mouth every 6 (six) hours as needed for nausea. 40 tablet 1   Prenatal Vit-Fe Fumarate-FA (PRENATAL VITAMIN) 27-0.8 MG TABS Take 1 tablet by mouth daily. 30 tablet 0   acetaminophen (TYLENOL) 325 MG tablet Take 2 tablets (650 mg total) by mouth every 8 (eight) hours as needed for mild pain, fever or moderate pain. (Patient not taking: Reported on 12/22/2022) 30 tablet 0   prochlorperazine (COMPAZINE) 10 MG tablet Take 1 tablet (10 mg total) by mouth every 6 (six) hours as needed for nausea or vomiting. (Patient not taking: Reported on 01/17/2023) 30 tablet 0   No current facility-administered medications for this visit.    ROS: Endorses N/V related to pregnancy  Objective:  Psychiatric Specialty Exam: Last menstrual period 07/24/2022.There is no height or weight on file to calculate BMI.  General Appearance: Casual and Fairly Groomed  Eye Contact:  Good  Speech:  Clear and Coherent and Normal Rate  Volume:  Normal  Mood:   "a lot of emotions"  Affect:   Euthymic; calm; appropriately reactive  Thought Content:  Denies AVH; IOR; no overt delusional content on interview    Suicidal Thoughts:  No  Homicidal Thoughts:  No  Thought Process:  Goal Directed and Linear  Orientation:  Full (Time, Place, and Person)    Memory:   Grossly intact  Judgment:   Good  Insight:  Good  Concentration:  Concentration: Good  Recall:  NA  Fund of Knowledge: Good  Language: Good  Psychomotor Activity:  Normal  Akathisia:  No  AIMS (if indicated): not done  Assets:  Communication Skills Desire for Improvement Housing Intimacy Physical Health Social Support Transportation Vocational/Educational  ADL's:  Intact  Cognition: WNL  Sleep:   poor   PE: General: sits comfortably in view of camera; no acute distress  Pulm: no increased work of breathing on room air  MSK: all extremity movements appear intact  Neuro: no focal neurological deficits observed  Gait & Station: unable to assess by video    Metabolic Disorder Labs: Lab Results  Component Value Date   HGBA1C 5.1 10/06/2022   MPG 99.67 04/17/2021   No results found for: "PROLACTIN" Lab Results  Component Value Date   CHOL 150 04/17/2021   TRIG 71 04/17/2021   HDL 38 (L) 04/17/2021   CHOLHDL 3.9 04/17/2021   VLDL 14 04/17/2021   LDLCALC 98 04/17/2021   Lab Results  Component Value Date   TSH 0.424 (L) 10/06/2022   TSH 1.495 04/17/2021    Therapeutic Level Labs: No results found for: "LITHIUM" No results found for: "VALPROATE" No results found for: "CBMZ"  Screenings:  GAD-7    Flowsheet Row Routine Prenatal from 02/01/2023 in [facility] for [facility] at [facility] Initial Prenatal from 10/27/2022 in [facility] for [facility] at [facility] Video Visit from 09/23/2021 in [facility] Video Visit from 07/23/2021 in [facility] Counselor from 06/18/2021 in [facility]  Total GAD-7 Score 18 16 12 16  0      PHQ2-9    Flowsheet Row Appointment from 03/07/2023 in [facility] MATERNAL FETAL CARE IMAGING Routine Prenatal from 02/01/2023 in [facility] for [facility] Healthcare at [facility] Initial Prenatal from 10/27/2022 in [facility] for [facility] Healthcare  at [facility] Video Visit from 09/23/2021 in [facility] Counselor from 09/03/2021 in [facility]  PHQ-2 Total Score 1 2 0 1 0  PHQ-9 Total Score -- 14 6 -- 0      Flowsheet Row ED from 12/23/2022 in [facility] Department at [facility] Admission (Discharged) from 11/26/2022 in [facility] ED from 10/30/2022 in [facility] Emergency Department at [facility]  C-SSRS RISK CATEGORY No Risk No Risk No Risk       Collaboration of Care: Collaboration of Care: Medication Management AEB active medication management, Psychiatrist AEB established with this provider, and Other provider involved in patient's care AEB referral placed for individual psychotherapy  Patient/Guardian was advised Release of Information must be obtained prior to any record release in order to collaborate their care with an outside provider. Patient/Guardian was advised if they have not already done so to contact the registration department to sign all necessary forms in order for [place] to release information regarding their care.   Consent: Patient/Guardian gives verbal consent for treatment and assignment of benefits for services provided during this visit. Patient/Guardian expressed understanding and agreed to proceed.   Televisit via video: I connected with patient on 03/11/23 at 11:00 AM EDT by a video enabled telemedicine application and verified that I am speaking with the correct person using two identifiers.  Location: Patient: home address in [place] Provider: remote office in [place]   I discussed the limitations of evaluation and management by telemedicine and the availability of in person appointments. The patient expressed understanding and agreed to proceed.  I discussed the assessment and treatment plan with the patient. The patient was provided an opportunity to ask questions and all were answered. The patient agreed with the  plan and demonstrated an understanding of the instructions.   The patient was advised to call back or seek an in-person evaluation if the symptoms worsen or if the condition fails to improve as anticipated.  I provided 45 minutes  dedicated to the care of this patient via video on the date of this encounter to include chart review, face-to-face time with the patient, medication management/counseling, brief therapeutic support.  Wells Gerdeman A Romell Cavanah 03/11/2023, 12:35 PM

## 2023-03-11 ENCOUNTER — Telehealth (INDEPENDENT_AMBULATORY_CARE_PROVIDER_SITE_OTHER): Payer: MEDICAID | Admitting: Psychiatry

## 2023-03-11 ENCOUNTER — Encounter (HOSPITAL_COMMUNITY): Payer: Self-pay | Admitting: Psychiatry

## 2023-03-11 DIAGNOSIS — F411 Generalized anxiety disorder: Secondary | ICD-10-CM

## 2023-03-11 DIAGNOSIS — F339 Major depressive disorder, recurrent, unspecified: Secondary | ICD-10-CM

## 2023-03-11 NOTE — Patient Instructions (Addendum)
Thank you for attending your appointment today.  -- We did not start any new prescribed medications today. However, recommend starting Unisom (doxylamine) 25 mg nightly to be taken 30 minutes before bedtime as needed for sleep. This can also be helpful for nausea in some women. You can find this over the counter at your local drug store or pharmacy.  -- Continue other medications as prescribed.  Please do not make any changes to medications without first discussing with your provider. If you are experiencing a psychiatric emergency, please call 911 or present to your nearest emergency department. Additional crisis, medication management, and therapy resources are included below.  [facility]  [address] [place], [place] [address] [phone] WALK-IN URGENT CARE 24/7 FOR ANYONE [address], [place], [place]  [phone] Fax: [phone] [email or web address] *Interpreters available *Accepts all insurance and uninsured for Urgent Care needs *Accepts Medicaid and uninsured for outpatient treatment (below)      ONLY FOR [facility]  Below:    Outpatient New Patient Assessment/Therapy Walk-ins:        Monday -Thursday 8am until slots are full.        Every Friday 1pm-4pm  (first come, first served)                   New Patient Psychiatry/Medication Management        Monday-Friday 8am-11am (first come, first served)               For all walk-ins we ask that you arrive by 7:15am, because patients will be seen in the order of arrival.

## 2023-03-22 ENCOUNTER — Encounter: Payer: MEDICAID | Admitting: Obstetrics and Gynecology

## 2023-03-23 ENCOUNTER — Ambulatory Visit (INDEPENDENT_AMBULATORY_CARE_PROVIDER_SITE_OTHER): Payer: MEDICAID | Admitting: Family Medicine

## 2023-03-23 ENCOUNTER — Encounter: Payer: Self-pay | Admitting: Family Medicine

## 2023-03-23 VITALS — BP 112/73 | HR 88 | Wt 159.0 lb

## 2023-03-23 DIAGNOSIS — O24415 Gestational diabetes mellitus in pregnancy, controlled by oral hypoglycemic drugs: Secondary | ICD-10-CM

## 2023-03-23 DIAGNOSIS — Z3403 Encounter for supervision of normal first pregnancy, third trimester: Secondary | ICD-10-CM

## 2023-03-23 DIAGNOSIS — O26899 Other specified pregnancy related conditions, unspecified trimester: Secondary | ICD-10-CM

## 2023-03-23 DIAGNOSIS — Z6791 Unspecified blood type, Rh negative: Secondary | ICD-10-CM

## 2023-03-23 DIAGNOSIS — Z3A34 34 weeks gestation of pregnancy: Secondary | ICD-10-CM

## 2023-03-23 DIAGNOSIS — O2302 Infections of kidney in pregnancy, second trimester: Secondary | ICD-10-CM

## 2023-03-23 NOTE — Progress Notes (Signed)
   PRENATAL VISIT NOTE  Subjective:  Julie Rios is a 22 y.o. G1P0 at [redacted]w[redacted]d being seen today for ongoing prenatal care.  She is currently monitored for the following issues for this high-risk pregnancy and has Suicidal ideation; MDD (major depressive disorder), recurrent episode (HCC); Generalized anxiety disorder; Encounter for supervision of normal first pregnancy; Nausea and vomiting in pregnancy; Rh negative state in antepartum period; Asymptomatic bacteriuria during pregnancy in first trimester; Pyelonephritis; Pyelonephritis affecting pregnancy in second trimester; Seizure-like activity (HCC); and GDM (gestational diabetes mellitus) on their problem list.  Patient reports no complaints.  Contractions: Irritability. Vag. Bleeding: None.  Movement: Present. Denies leaking of fluid.   The following portions of the patient's history were reviewed and updated as appropriate: allergies, current medications, past family history, past medical history, past social history, past surgical history and problem list.   Objective:   Vitals:   03/23/23 1354  BP: 112/73  Pulse: 88  Weight: 159 lb (72.1 kg)    Fetal Status: Fetal Heart Rate (bpm): 142 Fundal Height: 33 cm Movement: Present     General:  Alert, oriented and cooperative. Patient is in no acute distress.  Skin: Skin is warm and dry. No rash noted.   Cardiovascular: Normal heart rate noted  Respiratory: Normal respiratory effort, no problems with respiration noted  Abdomen: Soft, gravid, appropriate for gestational age.  Pain/Pressure: Absent     Pelvic: Cervical exam deferred        Extremities: Normal range of motion.  Edema: Moderate pitting, indentation subsides rapidly  Mental Status: Normal mood and affect. Normal behavior. Normal judgment and thought content.   Assessment and Plan:  Pregnancy: G1P0 at [redacted]w[redacted]d 1. Gestational diabetes mellitus (GDM) in third trimester controlled on oral hypoglycemic drug Continue diet Last EFW  22% FBS 73-94 2 hour pp 78-149 (5 of 21 are out of range)  2. Pyelonephritis affecting pregnancy in second trimester On supression  3. Rh negative state in antepartum period Rhogam @ 28 weeks and pp if indicated  4. Encounter for supervision of normal first pregnancy in third trimester   5. [redacted] weeks gestation of pregnancy   Preterm labor symptoms and general obstetric precautions including but not limited to vaginal bleeding, contractions, leaking of fluid and fetal movement were reviewed in detail with the patient. Please refer to After Visit Summary for other counseling recommendations.   Return in 2 weeks (on 04/06/2023).  Future Appointments  Date Time Provider Department Center  04/06/2023  1:50 PM Reva Bores, MD [facility]  04/13/2023  2:30 PM Reva Bores, MD [facility]  04/15/2023 11:00 AM Daine Gip [facility] None  04/18/2023  9:00 AM [facility]  04/20/2023  1:30 PM Reva Bores, MD [facility]  04/27/2023  1:30 PM Reva Bores, MD [facility]    Reva Bores, MD

## 2023-03-23 NOTE — Progress Notes (Signed)
ROB   CC: None   Pt has blood sugar log on phone.

## 2023-04-04 ENCOUNTER — Inpatient Hospital Stay (HOSPITAL_COMMUNITY): Payer: MEDICAID

## 2023-04-04 ENCOUNTER — Encounter (HOSPITAL_COMMUNITY): Payer: Self-pay | Admitting: Obstetrics & Gynecology

## 2023-04-04 ENCOUNTER — Inpatient Hospital Stay (HOSPITAL_COMMUNITY)
Admission: AD | Admit: 2023-04-04 | Discharge: 2023-04-04 | Disposition: A | Discharge status: Home / Self Care | Payer: MEDICAID | Attending: Obstetrics & Gynecology | Admitting: Obstetrics & Gynecology

## 2023-04-04 DIAGNOSIS — O99213 Obesity complicating pregnancy, third trimester: Secondary | ICD-10-CM | POA: Diagnosis not present

## 2023-04-04 DIAGNOSIS — Z3A36 36 weeks gestation of pregnancy: Secondary | ICD-10-CM

## 2023-04-04 DIAGNOSIS — O368130 Decreased fetal movements, third trimester, not applicable or unspecified: Secondary | ICD-10-CM | POA: Insufficient documentation

## 2023-04-04 DIAGNOSIS — O368190 Decreased fetal movements, unspecified trimester, not applicable or unspecified: Secondary | ICD-10-CM

## 2023-04-04 DIAGNOSIS — O24410 Gestational diabetes mellitus in pregnancy, diet controlled: Secondary | ICD-10-CM | POA: Diagnosis not present

## 2023-04-04 DIAGNOSIS — E669 Obesity, unspecified: Secondary | ICD-10-CM

## 2023-04-04 LAB — CBC WITH DIFFERENTIAL/PLATELET
Abs Immature Granulocytes: 0.11 10*3/uL — ABNORMAL HIGH (ref 0.00–0.07)
Basophils Absolute: 0 10*3/uL (ref 0.0–0.1)
Basophils Relative: 0 %
Eosinophils Absolute: 0 10*3/uL (ref 0.0–0.5)
Eosinophils Relative: 0 %
HCT: 35.8 % — ABNORMAL LOW (ref 36.0–46.0)
Hemoglobin: 11.8 g/dL — ABNORMAL LOW (ref 12.0–15.0)
Immature Granulocytes: 1 %
Lymphocytes Relative: 12 %
Lymphs Abs: 1.5 10*3/uL (ref 0.7–4.0)
MCH: 26.7 pg (ref 26.0–34.0)
MCHC: 33 g/dL (ref 30.0–36.0)
MCV: 81 fL (ref 80.0–100.0)
Monocytes Absolute: 0.6 10*3/uL (ref 0.1–1.0)
Monocytes Relative: 5 %
Neutro Abs: 10.1 10*3/uL — ABNORMAL HIGH (ref 1.7–7.7)
Neutrophils Relative %: 82 %
Platelets: 391 10*3/uL (ref 150–400)
RBC: 4.42 MIL/uL (ref 3.87–5.11)
RDW: 14.3 % (ref 11.5–15.5)
WBC: 12.4 10*3/uL — ABNORMAL HIGH (ref 4.0–10.5)
nRBC: 0 % (ref 0.0–0.2)

## 2023-04-04 LAB — URINALYSIS, ROUTINE W REFLEX MICROSCOPIC
Bilirubin Urine: NEGATIVE
Glucose, UA: 50 mg/dL — AB
Hgb urine dipstick: NEGATIVE
Ketones, ur: 80 mg/dL — AB
Leukocytes,Ua: NEGATIVE
Nitrite: NEGATIVE
Protein, ur: 100 mg/dL — AB
Specific Gravity, Urine: 1.027 (ref 1.005–1.030)
pH: 5 (ref 5.0–8.0)

## 2023-04-04 LAB — COMPREHENSIVE METABOLIC PANEL
ALT: 113 U/L — ABNORMAL HIGH (ref 0–44)
AST: 127 U/L — ABNORMAL HIGH (ref 15–41)
Albumin: 3.2 g/dL — ABNORMAL LOW (ref 3.5–5.0)
Alkaline Phosphatase: 111 U/L (ref 38–126)
Anion gap: 14 (ref 5–15)
BUN: 5 mg/dL — ABNORMAL LOW (ref 6–20)
CO2: 17 mmol/L — ABNORMAL LOW (ref 22–32)
Calcium: 9.2 mg/dL (ref 8.9–10.3)
Chloride: 103 mmol/L (ref 98–111)
Creatinine, Ser: 0.51 mg/dL (ref 0.44–1.00)
GFR, Estimated: >60 mL/min (ref >60)
Glucose, Bld: 92 mg/dL (ref 70–99)
Potassium: 3.1 mmol/L — ABNORMAL LOW (ref 3.5–5.1)
Sodium: 134 mmol/L — ABNORMAL LOW (ref 135–145)
Total Bilirubin: 1.1 mg/dL (ref <1.2)
Total Protein: 6.9 g/dL (ref 6.5–8.1)

## 2023-04-04 LAB — LIPASE, BLOOD: Lipase: 22 U/L (ref 11–51)

## 2023-04-04 LAB — GLUCOSE, CAPILLARY: Glucose-Capillary: 82 mg/dL (ref 70–99)

## 2023-04-04 LAB — TSH: TSH: 1.744 u[IU]/mL (ref 0.350–4.500)

## 2023-04-04 NOTE — MAU Provider Note (Signed)
History     CSN: [id number]  Arrival date and time: 04/04/23 1428   None     Chief Complaint  Patient presents with   Decreased Fetal Movement   Abdominal Pain   Back Pain   Emesis   Nausea   Extremity Weakness   Fatigue   Shortness of Breath   stressed   Foot Swelling   HPI  22 yo g1 @ 36+2, pyelo this pregnancy on suppressive therapy, a1gdm with normal fasting this morning, presenting with decreased fetal movement.   Reports 3 days decreased movement, not absent. No leakage of fluid or regular contractions or bleeding. No fever or dysuria or change in frequency or hematuria. No trauma.   Past Medical History:  Diagnosis Date   Anxiety    Depression    MDD (major depressive disorder), single episode, moderate (HCC)     Past Surgical History:  Procedure Laterality Date   NO PAST SURGERIES      Family History  Problem Relation Age of Onset   Diabetes Mother    Cirrhosis Mother    Diabetes Father     Social History   Tobacco Use   Smoking status: Never    Passive exposure: Yes   Smokeless tobacco: Never  Vaping Use   Vaping status: Some Days   Substances: Nicotine  Substance Use Topics   Alcohol use: No   Drug use: Yes    Types: Other-see comments, Marijuana    Comment: reports use of THC-A approx. nightly    Allergies: No Known Allergies  Medications Prior to Admission  Medication Sig Dispense Refill Last Dose   cephALEXin (KEFLEX) 500 MG capsule Take 1 capsule (500 mg total) by mouth at bedtime. Continue this for the rest of the pregnancy 30 capsule 4 04/03/2023   famotidine (PEPCID) 20 MG tablet Take 1 tablet (20 mg total) by mouth 2 (two) times daily. 60 tablet 3 04/03/2023   metoCLOPramide (REGLAN) 10 MG tablet Take 1 tablet (10 mg total) by mouth 3 (three) times daily before meals. 90 tablet 2 04/03/2023   ondansetron (ZOFRAN-ODT) 4 MG disintegrating tablet Take 1 tablet (4 mg total) by mouth every 6 (six) hours as needed for nausea. 40  tablet 1 04/03/2023   Accu-Chek Softclix Lancets lancets 1 each by Other route 4 (four) times daily. 100 each 12    acetaminophen (TYLENOL) 325 MG tablet Take 2 tablets (650 mg total) by mouth every 8 (eight) hours as needed for mild pain, fever or moderate pain. (Patient not taking: Reported on 12/22/2022) 30 tablet 0    Blood Glucose Monitoring Suppl (ACCU-CHEK GUIDE) w/Device KIT 1 Device by Does not apply route 4 (four) times daily. 1 kit 0    feeding supplement, GLUCERNA SHAKE, (GLUCERNA SHAKE) LIQD Take 237 mLs by mouth 3 (three) times daily between meals. Dispense 1 month supply (Patient not taking: Reported on 03/23/2023) 237 mL 3    glucose blood (ACCU-CHEK GUIDE) test strip Use to check blood sugars four times a day was instructed 100 each 3    Prenatal Vit-Fe Fumarate-FA (PRENATAL VITAMIN) 27-0.8 MG TABS Take 1 tablet by mouth daily. 30 tablet 0    prochlorperazine (COMPAZINE) 10 MG tablet Take 1 tablet (10 mg total) by mouth every 6 (six) hours as needed for nausea or vomiting. (Patient not taking: Reported on 01/17/2023) 30 tablet 0     Review of Systems Physical Exam   Blood pressure 126/82, pulse (!) 106, temperature 97.7 F (36.5 C),  temperature source Oral, resp. rate 18, height 5\' 4"  (1.626 m), weight 69.2 kg, last menstrual period 07/24/2022, SpO2 98%.  Physical Exam NAD RRR Abdomen gravid, non-tender   MAU Course  Procedures  Will check NST and BPP, has been 4 wks since last growth scan so will also check u/s for growth.  Assessment and Plan  22 yo g1 @ 36+2 here with several days decreased fetal movement. 10/10 bpp with normal growth, not in labor, no signs abruption or other pathology. Hx pyelo on suppressive therapy, will add on urine culture given bacteriuria. Has OB f/u in 2 days, consider repeat NST. Instructed in kick counts.   Silvano Bilis 04/04/2023, 3:39 PM

## 2023-04-04 NOTE — MAU Note (Signed)
Julie Rios is a 22 y.o. at [redacted]w[redacted]d here in MAU reporting: been throwing up the past 24 hrs, can't keep anything down.  Feeling weak and SOB.  Been having abd and back pain, like a band wrapping around from her back.  Had a HA last night, it went away, she took meds for it- but threw them up. Weakness in her arms and legs.  Has had swelling in her hands, feet and face.  Decreased movement today, only a few very slight movement.  Has had some menstrual like cramps. Has had a lot of tiredness lately. BS has been ok.  Has been stressed.  Onset of complaint: Saturday Pain score: 6 Vitals:   04/04/23 1443  BP: 119/81  Pulse: (!) 111  Resp: 18  Temp: 97.7 F (36.5 C)  SpO2: 98%     FHT:138 Lab orders placed from triage:  urine

## 2023-04-05 ENCOUNTER — Encounter: Payer: MEDICAID | Admitting: Obstetrics & Gynecology

## 2023-04-06 ENCOUNTER — Other Ambulatory Visit (HOSPITAL_COMMUNITY)
Admission: RE | Admit: 2023-04-06 | Discharge: 2023-04-06 | Disposition: A | Discharge status: Home / Self Care | Payer: MEDICAID | Source: Ambulatory Visit | Attending: Obstetrics & Gynecology | Admitting: Obstetrics & Gynecology

## 2023-04-06 ENCOUNTER — Ambulatory Visit (INDEPENDENT_AMBULATORY_CARE_PROVIDER_SITE_OTHER): Payer: MEDICAID | Admitting: Obstetrics and Gynecology

## 2023-04-06 VITALS — BP 110/76 | HR 91 | Wt 155.0 lb

## 2023-04-06 DIAGNOSIS — O26899 Other specified pregnancy related conditions, unspecified trimester: Secondary | ICD-10-CM

## 2023-04-06 DIAGNOSIS — Z3A36 36 weeks gestation of pregnancy: Secondary | ICD-10-CM

## 2023-04-06 DIAGNOSIS — R7401 Elevation of levels of liver transaminase levels: Secondary | ICD-10-CM

## 2023-04-06 DIAGNOSIS — O2302 Infections of kidney in pregnancy, second trimester: Secondary | ICD-10-CM

## 2023-04-06 DIAGNOSIS — Z3483 Encounter for supervision of other normal pregnancy, third trimester: Secondary | ICD-10-CM | POA: Diagnosis not present

## 2023-04-06 DIAGNOSIS — O219 Vomiting of pregnancy, unspecified: Secondary | ICD-10-CM

## 2023-04-06 DIAGNOSIS — O24410 Gestational diabetes mellitus in pregnancy, diet controlled: Secondary | ICD-10-CM

## 2023-04-06 DIAGNOSIS — Z6791 Unspecified blood type, Rh negative: Secondary | ICD-10-CM

## 2023-04-06 LAB — CULTURE, OB URINE: Culture: NO GROWTH

## 2023-04-06 NOTE — Progress Notes (Signed)
error 

## 2023-04-06 NOTE — Progress Notes (Signed)
ROB: Went to [facility] 04/04/23  Urine showed bacteria, protein Kidney function messed up  Wondering about PE labs, Pt having headaches, BP has been fine   Keflex helps

## 2023-04-06 NOTE — Progress Notes (Signed)
PRENATAL VISIT NOTE  Subjective:  Julie Rios is a 22 y.o. G1P0 at [redacted]w[redacted]d being seen today for ongoing prenatal care.  She is currently monitored for the following issues for this high-risk pregnancy and has Suicidal ideation; MDD (major depressive disorder), recurrent episode (HCC); Generalized anxiety disorder; Encounter for supervision of normal first pregnancy; Nausea and vomiting in pregnancy; Rh negative state in antepartum period; Asymptomatic bacteriuria during pregnancy in first trimester; Pyelonephritis; Pyelonephritis affecting pregnancy in second trimester; Seizure-like activity (HCC); and GDM (gestational diabetes mellitus) on their problem list.  Patient reports  better nausea, vomiting and no HA today .  Contractions: Not present. Vag. Bleeding: None.  Movement: Present. Denies leaking of fluid.   The following portions of the patient's history were reviewed and updated as appropriate: allergies, current medications, past family history, past medical history, past social history, past surgical history and problem list.   Objective:   Vitals:   04/06/23 1417  BP: 110/76  Pulse: 91  Weight: 155 lb (70.3 kg)    Fetal Status: Fetal Heart Rate (bpm): 137 Fundal Height: 35 cm Movement: Present  Presentation: Vertex  General:  Alert, oriented and cooperative. Patient is in no acute distress.  Skin: Skin is warm and dry. No rash noted.   Cardiovascular: Normal heart rate noted  Respiratory: Normal respiratory effort, no problems with respiration noted  Abdomen: Soft, gravid, appropriate for gestational age.  Pain/Pressure: Absent     Pelvic: Cervical exam performed in the presence of a chaperone Dilation: Closed Effacement (%): Thick Station: Ballotable  Extremities: Normal range of motion.  Edema: Trace  Mental Status: Normal mood and affect. Normal behavior. Normal judgment and thought content.   Assessment and Plan:  Pregnancy: G1P0 at [redacted]w[redacted]d 1. Transaminitis In MAU.  Repeat today. Will get outpatient ruq u/s.     Latest Ref Rng & Units 04/04/2023    3:17 PM 12/23/2022    5:28 AM 11/26/2022    5:06 PM  CMP  Glucose 70 - 99 mg/dL 92  83  78   BUN 6 - 20 mg/dL 5  <3  <5   Creatinine 0.44 - 1.00 mg/dL 1.91  4.78  2.95   Sodium 135 - 145 mmol/L 134  137  132   Potassium 3.5 - 5.1 mmol/L 3.1  3.3  3.6   Chloride 98 - 111 mmol/L 103  104  103   CO2 22 - 32 mmol/L 17   18   Calcium 8.9 - 10.3 mg/dL 9.2   8.9   Total Protein 6.5 - 8.1 g/dL 6.9   7.1   Total Bilirubin <1.2 mg/dL 1.1   2.1   Alkaline Phos 38 - 126 U/L 111   59   AST 15 - 41 U/L 127   18   ALT 0 - 44 U/L 113   18   - Comprehensive metabolic panel - US Abdomen Limited RUQ (LIVER/GB); Future  2. Pyelonephritis affecting pregnancy in second trimester Continue at bedtime keflex Mau ucx still pending  3. Nausea and vomiting in pregnancy Strategies d/w her. -4lbs from 11/13 visit. See above.  - US Abdomen Limited RUQ (LIVER/GB); Future  4. Rh negative state in antepartum period  5. [redacted] weeks gestation of pregnancy - Cervicovaginal ancillary only - Strep Gp B NAA - Comprehensive metabolic panel - US Abdomen Limited RUQ (LIVER/GB); Future  6. Diet controlled gestational diabetes mellitus (GDM) in third trimester Patient amenable to 39wk IOL. 12/14 AM requested 11/25: 20%, 2574gm, ac  26%, afi 18, bpp 8/8, cephalic. Efw stable. 22% and ac 33% on 10/28  Preterm labor symptoms and general obstetric precautions including but not limited to vaginal bleeding, contractions, leaking of fluid and fetal movement were reviewed in detail with the patient. Please refer to After Visit Summary for other counseling recommendations.   Return in about 1 week (around 04/13/2023) for in person, high risk ob, md or app.  Future Appointments  Date Time Provider Department Center  04/13/2023  2:30 PM Reva Bores, MD [facility]  04/15/2023 11:00 AM Daine Gip [facility] None  04/20/2023  1:30  PM Reva Bores, MD [facility]  04/27/2023  1:30 PM Reva Bores, MD [facility]    Walthall Bing, MD

## 2023-04-07 LAB — COMPREHENSIVE METABOLIC PANEL
ALT: 184 [IU]/L — ABNORMAL HIGH (ref 0–32)
AST: 215 [IU]/L — ABNORMAL HIGH (ref 0–40)
Albumin: 4 g/dL (ref 4.0–5.0)
Alkaline Phosphatase: 141 [IU]/L — ABNORMAL HIGH (ref 44–121)
BUN/Creatinine Ratio: 7 — ABNORMAL LOW (ref 9–23)
BUN: 4 mg/dL — ABNORMAL LOW (ref 6–20)
Bilirubin Total: 0.6 mg/dL (ref 0.0–1.2)
CO2: 19 mmol/L — ABNORMAL LOW (ref 20–29)
Calcium: 9.3 mg/dL (ref 8.7–10.2)
Chloride: 98 mmol/L (ref 96–106)
Creatinine, Ser: 0.58 mg/dL (ref 0.57–1.00)
Globulin, Total: 2.8 g/dL (ref 1.5–4.5)
Glucose: 72 mg/dL (ref 70–99)
Potassium: 3.8 mmol/L (ref 3.5–5.2)
Sodium: 135 mmol/L (ref 134–144)
Total Protein: 6.8 g/dL (ref 6.0–8.5)
eGFR: 132 mL/min/{1.73_m2} (ref >59)

## 2023-04-08 ENCOUNTER — Encounter: Payer: Self-pay | Admitting: Obstetrics and Gynecology

## 2023-04-08 ENCOUNTER — Ambulatory Visit: Admission: RE | Admit: 2023-04-08 | Payer: MEDICAID | Source: Ambulatory Visit

## 2023-04-08 DIAGNOSIS — R7401 Elevation of levels of liver transaminase levels: Secondary | ICD-10-CM | POA: Insufficient documentation

## 2023-04-08 LAB — STREP GP B NAA: Strep Gp B NAA: NEGATIVE

## 2023-04-12 ENCOUNTER — Encounter: Payer: MEDICAID | Admitting: Obstetrics & Gynecology

## 2023-04-12 LAB — ACUTE VIRAL HEPATITIS (HAV, HBV, HCV)
HCV Ab: NONREACTIVE
Hep A IgM: NEGATIVE
Hep B C IgM: NEGATIVE
Hepatitis B Surface Ag: NEGATIVE

## 2023-04-12 LAB — HCV INTERPRETATION

## 2023-04-12 LAB — SPECIMEN STATUS REPORT

## 2023-04-13 ENCOUNTER — Ambulatory Visit (INDEPENDENT_AMBULATORY_CARE_PROVIDER_SITE_OTHER): Payer: MEDICAID | Admitting: Family Medicine

## 2023-04-13 ENCOUNTER — Ambulatory Visit
Admission: RE | Admit: 2023-04-13 | Discharge: 2023-04-13 | Disposition: A | Discharge status: Home / Self Care | Payer: MEDICAID | Source: Ambulatory Visit | Attending: Obstetrics and Gynecology | Admitting: Obstetrics and Gynecology

## 2023-04-13 VITALS — BP 113/73 | HR 92 | Wt 163.6 lb

## 2023-04-13 DIAGNOSIS — O2302 Infections of kidney in pregnancy, second trimester: Secondary | ICD-10-CM

## 2023-04-13 DIAGNOSIS — R7401 Elevation of levels of liver transaminase levels: Secondary | ICD-10-CM | POA: Insufficient documentation

## 2023-04-13 DIAGNOSIS — Z3A36 36 weeks gestation of pregnancy: Secondary | ICD-10-CM | POA: Insufficient documentation

## 2023-04-13 DIAGNOSIS — L299 Pruritus, unspecified: Secondary | ICD-10-CM | POA: Insufficient documentation

## 2023-04-13 DIAGNOSIS — Z3403 Encounter for supervision of normal first pregnancy, third trimester: Secondary | ICD-10-CM

## 2023-04-13 DIAGNOSIS — Z3A37 37 weeks gestation of pregnancy: Secondary | ICD-10-CM

## 2023-04-13 DIAGNOSIS — O24410 Gestational diabetes mellitus in pregnancy, diet controlled: Secondary | ICD-10-CM

## 2023-04-13 DIAGNOSIS — O219 Vomiting of pregnancy, unspecified: Secondary | ICD-10-CM | POA: Diagnosis present

## 2023-04-13 DIAGNOSIS — K8020 Calculus of gallbladder without cholecystitis without obstruction: Secondary | ICD-10-CM

## 2023-04-13 LAB — CERVICOVAGINAL ANCILLARY ONLY
Chlamydia: NEGATIVE
Comment: NEGATIVE
Comment: NORMAL
Neisseria Gonorrhea: NEGATIVE

## 2023-04-13 NOTE — Progress Notes (Signed)
   PRENATAL VISIT NOTE  Subjective:  Julie Rios is a 22 y.o. G1P0 at [redacted]w[redacted]d being seen today for ongoing prenatal care.  She is currently monitored for the following issues for this high-risk pregnancy and has Suicidal ideation; MDD (major depressive disorder), recurrent episode (HCC); Generalized anxiety disorder; Encounter for supervision of normal first pregnancy; Nausea and vomiting in pregnancy; Rh negative state in antepartum period; Asymptomatic bacteriuria during pregnancy in first trimester; Pyelonephritis; Pyelonephritis affecting pregnancy in second trimester; Seizure-like activity (HCC); GDM (gestational diabetes mellitus); and Transaminitis on their problem list.  Patient reports nausea and itching .  Contractions: Irregular. Vag. Bleeding: None.  Movement: Present. Denies leaking of fluid.   The following portions of the patient's history were reviewed and updated as appropriate: allergies, current medications, past family history, past medical history, past social history, past surgical history and problem list.   Objective:   Vitals:   04/13/23 1438  BP: 113/73  Pulse: 92  Weight: 163 lb 9.6 oz (74.2 kg)    Fetal Status: Fetal Heart Rate (bpm): 139 Fundal Height: 31 cm Movement: Present  Presentation: Vertex  General:  Alert, oriented and cooperative. Patient is in no acute distress.  Skin: Skin is warm and dry. No rash noted.   Cardiovascular: Normal heart rate noted  Respiratory: Normal respiratory effort, no problems with respiration noted  Abdomen: Soft, gravid, appropriate for gestational age.  Pain/Pressure: Present     Pelvic: Cervical exam performed in the presence of a chaperone Dilation: 1 Effacement (%): 30, 40 Station: -2  Extremities: Normal range of motion.  Edema: None  Mental Status: Normal mood and affect. Normal behavior. Normal judgment and thought content.   Assessment and Plan:  Pregnancy: G1P0 at [redacted]w[redacted]d 1. Transaminitis Repeat LFTs -  Comprehensive metabolic panel  2. Pruritus Mostly in her abdomen - Bile acids, total  3. [redacted] weeks gestation of pregnancy   4. Diet controlled gestational diabetes mellitus (GDM) in third trimester FBS 77-95 2 hour pp 85-121- 1 out of range  Last u/s for growth WNL IOL scheduled  5. Encounter for supervision of normal first pregnancy in third trimester   6. Pyelonephritis affecting pregnancy in second trimester Last urine culture is negative  7. Cholelithiasis Avoid fatty foods Refer to surgery post pregnancy for possible removal.  Preterm labor symptoms and general obstetric precautions including but not limited to vaginal bleeding, contractions, leaking of fluid and fetal movement were reviewed in detail with the patient. Please refer to After Visit Summary for other counseling recommendations.   Return in 1 week (on 04/20/2023).  Future Appointments  Date Time Provider Department Center  04/15/2023 11:00 AM Daine Gip [facility] None  04/20/2023  1:30 PM Reva Bores, MD [facility]  04/23/2023  6:30 AM [facility] SCHED ROOM [facility] None    Reva Bores, MD

## 2023-04-13 NOTE — Progress Notes (Signed)
ROB: Discuss stones in gallbladder, Pt stating that the itching is still there

## 2023-04-14 ENCOUNTER — Other Ambulatory Visit: Payer: Self-pay

## 2023-04-14 ENCOUNTER — Telehealth: Payer: Self-pay

## 2023-04-14 DIAGNOSIS — O26643 Intrahepatic cholestasis of pregnancy, third trimester: Secondary | ICD-10-CM

## 2023-04-14 LAB — COMPREHENSIVE METABOLIC PANEL
ALT: 62 [IU]/L — ABNORMAL HIGH (ref 0–32)
AST: 48 [IU]/L — ABNORMAL HIGH (ref 0–40)
Albumin: 3.5 g/dL — ABNORMAL LOW (ref 4.0–5.0)
Alkaline Phosphatase: 123 [IU]/L — ABNORMAL HIGH (ref 44–121)
BUN/Creatinine Ratio: 10 (ref 9–23)
BUN: 5 mg/dL — ABNORMAL LOW (ref 6–20)
Bilirubin Total: 0.3 mg/dL (ref 0.0–1.2)
CO2: 20 mmol/L (ref 20–29)
Calcium: 8.5 mg/dL — ABNORMAL LOW (ref 8.7–10.2)
Chloride: 102 mmol/L (ref 96–106)
Creatinine, Ser: 0.51 mg/dL — ABNORMAL LOW (ref 0.57–1.00)
Globulin, Total: 2.4 g/dL (ref 1.5–4.5)
Glucose: 66 mg/dL — ABNORMAL LOW (ref 70–99)
Potassium: 3.8 mmol/L (ref 3.5–5.2)
Sodium: 136 mmol/L (ref 134–144)
Total Protein: 5.9 g/dL — ABNORMAL LOW (ref 6.0–8.5)
eGFR: 136 mL/min/{1.73_m2} (ref >59)

## 2023-04-14 LAB — BILE ACIDS, TOTAL: Bile Acids Total: 24.1 umol/L (ref 0.0–10.0)

## 2023-04-14 NOTE — Telephone Encounter (Signed)
Per Dr. Vergie Living pt needs a referral to GI in week.  Pt scheduled at [facility] 04/28/23 as that is their soonest appt.  Left message for pt to return call to the office.   Tijuan Dantes,RN  04/14/23

## 2023-04-14 NOTE — Progress Notes (Unsigned)
BH MD Outpatient Progress Note  04/14/2023 11:04 AM Julie Rios  MRN:  [id number]  Assessment:  Julie Rios presents for follow-up evaluation. Today, 04/14/23, patient is re-establishing care after she was last seen in clinic Nov 2023. She has been off psychotropics since that time. She is currently pregnant and in her 3rd trimester and notes stress related to various complications in pregnancy including persistent N/V and GDM. She reports moments of increased irritability, anxiety especially in anticipation of arrival of baby, and occasional sadness however overall reports these moments are brief and in general feels excited about arrival of baby girl and in state of good mood or euthymia. It appears that current symptoms fall within realm of normal adjustment to current pregnancy however given history of depression will monitor carefully for recurrence of these symptoms that would warrant restarting SSRI. She would benefit from re-establishing in psychotherapy for further support and was amenable to referral. Given significant difficulty sleeping, recommended OTC doxylamine; this may additionally be helpful for N/V and reviewed recommendation to abstain from [facility] use given risks in pregnancy.   Plan to maintain close follow-up with RTC in 5 weeks by video.  Identifying Information: Julie Rios is a 22 y.o. G1P0 female currently pregnant at [redacted]w[redacted]d with a history of MDD and GAD who is an established patient with [facility] participating in follow-up via video conferencing.   Plan:  # MDD currently in remission  GAD Past medication trials: Wellbutrin XL, Prozac Status of problem: stable Interventions: -- Will defer restarting SSRI at this time given current symptoms of anxiety and irritability appear to fall within normal adjustment -- START doxylamine 25 mg nightly PRN sleep; can also be helpful for N/V in pregnancy -- Maternal use of ????l?mine in combination with  pyridoxine during pregnancy has not been shown to increase the baseline risk of major malformations.  -- Previously counseled against THC use -- Previously seen for individual psychotherapy with Deloris Ping, LCSW; referral placed today  Patient was given contact information for behavioral health clinic and was instructed to call 911 for emergencies.   Subjective:  Chief Complaint:  No chief complaint on file.   Interval History:    pregnancy Unisom for sleep, thc Therapy? With paige - not sched Induction sced 12/14    Visit Diagnosis:  No diagnosis found.   Past Psychiatric History:  Diagnoses: MDD, GAD Medication trials: Wellbutrin XL 150 mg Substance use:  -- Reports use of THC-A nightly to help with insomnia and N/V in pregnancy -- Denies etoh, tobacco, or illicit drug use   Past Medical History:  Past Medical History:  Diagnosis Date   Anxiety    Depression    MDD (major depressive disorder), single episode, moderate (HCC)     Past Surgical History:  Procedure Laterality Date   NO PAST SURGERIES      Family Psychiatric History:  Sister - Depression, borderline personality   Family History:  Family History  Problem Relation Age of Onset   Diabetes Mother    Cirrhosis Mother    Diabetes Father     Social History:  Social History   Socioeconomic History   Marital status: Single    Spouse name: Casimiro Needle   Number of children: Not on file   Years of education: Not on file   Highest education level: Not on file  Occupational History   Not on file  Tobacco Use   Smoking status: Never    Passive exposure: Yes   Smokeless  tobacco: Never  Vaping Use   Vaping status: Some Days   Substances: Nicotine  Substance and Sexual Activity   Alcohol use: No   Drug use: Yes    Types: Other-see comments, Marijuana    Comment: reports use of THC-A approx. nightly   Sexual activity: Yes    Birth control/protection: None  Other Topics Concern   Not on file   Social History Narrative   Not on file   Social Determinants of Health   Financial Resource Strain: Not on file  Food Insecurity: Food Insecurity Present (02/16/2023)   Hunger Vital Sign    Worried About Running Out of Food in the Last Year: Sometimes true    Ran Out of Food in the Last Year: Sometimes true  Transportation Needs: No Transportation Needs (11/26/2022)   PRAPARE - Administrator, Civil Service (Medical): No    Lack of Transportation (Non-Medical): No  Physical Activity: Not on file  Stress: Not on file  Social Connections: Unknown (09/22/2021)   Received from [facility], [facility]   Social Network    Social Network: Not on file    Allergies: No Known Allergies  Current Medications: Current Outpatient Medications  Medication Sig Dispense Refill   Accu-Chek Softclix Lancets lancets 1 each by Other route 4 (four) times daily. 100 each 12   acetaminophen (TYLENOL) 325 MG tablet Take 2 tablets (650 mg total) by mouth every 8 (eight) hours as needed for mild pain, fever or moderate pain. (Patient not taking: Reported on 12/22/2022) 30 tablet 0   Blood Glucose Monitoring Suppl (ACCU-CHEK GUIDE) w/Device KIT 1 Device by Does not apply route 4 (four) times daily. 1 kit 0   cephALEXin (KEFLEX) 500 MG capsule Take 1 capsule (500 mg total) by mouth at bedtime. Continue this for the rest of the pregnancy 30 capsule 4   famotidine (PEPCID) 20 MG tablet Take 1 tablet (20 mg total) by mouth 2 (two) times daily. 60 tablet 3   feeding supplement, GLUCERNA SHAKE, (GLUCERNA SHAKE) LIQD Take 237 mLs by mouth 3 (three) times daily between meals. Dispense 1 month supply (Patient not taking: Reported on 03/23/2023) 237 mL 3   glucose blood (ACCU-CHEK GUIDE) test strip Use to check blood sugars four times a day was instructed 100 each 3   metoCLOPramide (REGLAN) 10 MG tablet Take 1 tablet (10 mg total) by mouth 3 (three) times daily before meals. 90 tablet 2   ondansetron  (ZOFRAN-ODT) 4 MG disintegrating tablet Take 1 tablet (4 mg total) by mouth every 6 (six) hours as needed for nausea. 40 tablet 1   Prenatal Vit-Fe Fumarate-FA (PRENATAL VITAMIN) 27-0.8 MG TABS Take 1 tablet by mouth daily. 30 tablet 0   prochlorperazine (COMPAZINE) 10 MG tablet Take 1 tablet (10 mg total) by mouth every 6 (six) hours as needed for nausea or vomiting. (Patient not taking: Reported on 01/17/2023) 30 tablet 0   No current facility-administered medications for this visit.    ROS: Endorses N/V related to pregnancy  Objective:  Psychiatric Specialty Exam: Last menstrual period 07/24/2022.There is no height or weight on file to calculate BMI.  General Appearance: Casual and Fairly Groomed  Eye Contact:  Good  Speech:  Clear and Coherent and Normal Rate  Volume:  Normal  Mood:   "a lot of emotions"  Affect:   Euthymic; calm; appropriately reactive  Thought Content:  Denies AVH; IOR; no overt delusional content on interview    Suicidal Thoughts:  No  Homicidal Thoughts:  No  Thought Process:  Goal Directed and Linear  Orientation:  Full (Time, Place, and Person)    Memory:   Grossly intact  Judgment:  Good  Insight:  Good  Concentration:  Concentration: Good  Recall:  NA  Fund of Knowledge: Good  Language: Good  Psychomotor Activity:  Normal  Akathisia:  No  AIMS (if indicated): not done  Assets:  Communication Skills Desire for Improvement Housing Intimacy Physical Health Social Support Transportation Vocational/Educational  ADL's:  Intact  Cognition: WNL  Sleep:   poor   PE: General: sits comfortably in view of camera; no acute distress  Pulm: no increased work of breathing on room air  MSK: all extremity movements appear intact  Neuro: no focal neurological deficits observed  Gait & Station: unable to assess by video    Metabolic Disorder Labs: Lab Results  Component Value Date   HGBA1C 5.1 10/06/2022   MPG 99.67 04/17/2021   No results found  for: "PROLACTIN" Lab Results  Component Value Date   CHOL 150 04/17/2021   TRIG 71 04/17/2021   HDL 38 (L) 04/17/2021   CHOLHDL 3.9 04/17/2021   VLDL 14 04/17/2021   LDLCALC 98 04/17/2021   Lab Results  Component Value Date   TSH 1.744 04/04/2023   TSH 0.424 (L) 10/06/2022    Therapeutic Level Labs: No results found for: "LITHIUM" No results found for: "VALPROATE" No results found for: "CBMZ"  Screenings:  GAD-7    Flowsheet Row Routine Prenatal from 02/01/2023 in [facility] at [facility] Initial [facility]renatal from 10/27/2022 in [facility] for [facility] Healthcare at [facility] Video Visit from 09/23/2021 in [facility] Video Visit from 07/23/2021 in [facility] Counselor from 06/18/2021 in [facility]  Total GAD-7 Score 18 16 12 16  0      PHQ2-9    Flowsheet Row Appointment from 03/07/2023 in [facility] MATERNAL FETAL CARE IMAGING Routine Prenatal from 02/01/2023 in [facility] for [facility] Healthcare at [facility] Initial Prenatal from 10/27/2022 in [facility] for [facility] Healthcare at [facility] Video Visit from 09/23/2021 in [facility] Counselor from 09/03/2021 in [facility]  PHQ-2 Total Score 1 2 0 1 0  PHQ-9 Total Score -- 14 6 -- 0      Flowsheet Row Admission (Discharged) from 04/04/2023 in [facility]S Maternity Assessment Unit ED from 12/23/2022 in [facility] Emergency Department at [facility] Admission (Discharged) from 11/26/2022 in [facility]  C-SSRS RISK CATEGORY No Risk No Risk No Risk       Collaboration of Care: Collaboration of Care: Medication Management AEB active medication management, Psychiatrist AEB established with this provider, and Other provider involved in patient's care AEB referral placed for individual psychotherapy  Patient/Guardian was  advised Release of Information must be obtained prior to any record release in order to collaborate their care with an outside provider. Patient/Guardian was advised if they have not already done so to contact the registration department to sign all necessary forms in order for [place] to release information regarding their care.   Consent: Patient/Guardian gives verbal consent for treatment and assignment of benefits for services provided during this visit. Patient/Guardian expressed understanding and agreed to proceed.   Televisit via video: I connected with patient on 04/14/23 at 11:00 AM EST by a video enabled telemedicine application  and verified that I am speaking with the correct person using two identifiers.  Location: Patient: home address in [place] Provider: remote office in [place]   I discussed the limitations of evaluation and management by telemedicine and the availability of in person appointments. The patient expressed understanding and agreed to proceed.  I discussed the assessment and treatment plan with the patient. The patient was provided an opportunity to ask questions and all were answered. The patient agreed with the plan and demonstrated an understanding of the instructions.   The patient was advised to call back or seek an in-person evaluation if the symptoms worsen or if the condition fails to improve as anticipated.  I provided *** minutes dedicated to the care of this patient via video on the date of this encounter to include chart review, face-to-face time with the patient, medication management/counseling, brief therapeutic support.  Soriya Worster A Mikeisha Lemonds 04/14/2023, 11:04 AM

## 2023-04-15 ENCOUNTER — Telehealth: Payer: Self-pay | Admitting: *Deleted

## 2023-04-15 ENCOUNTER — Ambulatory Visit (INDEPENDENT_AMBULATORY_CARE_PROVIDER_SITE_OTHER): Payer: MEDICAID

## 2023-04-15 ENCOUNTER — Encounter (HOSPITAL_COMMUNITY): Payer: Self-pay

## 2023-04-15 ENCOUNTER — Telehealth (HOSPITAL_COMMUNITY): Payer: MEDICAID | Admitting: Psychiatry

## 2023-04-15 ENCOUNTER — Telehealth (HOSPITAL_COMMUNITY): Payer: Self-pay | Admitting: *Deleted

## 2023-04-15 ENCOUNTER — Ambulatory Visit (INDEPENDENT_AMBULATORY_CARE_PROVIDER_SITE_OTHER): Payer: MEDICAID | Admitting: *Deleted

## 2023-04-15 DIAGNOSIS — O26643 Intrahepatic cholestasis of pregnancy, third trimester: Secondary | ICD-10-CM

## 2023-04-15 DIAGNOSIS — Z3A37 37 weeks gestation of pregnancy: Secondary | ICD-10-CM | POA: Diagnosis not present

## 2023-04-15 MED ORDER — URSODIOL 500 MG PO TABS: 500.0000 mg | ORAL_TABLET | Freq: Two times a day (BID) | ORAL | 1 refills | Status: DC

## 2023-04-15 NOTE — Telephone Encounter (Signed)
-----   Message from Reva Bores sent at 04/15/2023  7:32 AM EST ----- Her bile acids are elevated--needs IOL moved up to 1st available

## 2023-04-15 NOTE — Telephone Encounter (Signed)
Preadmission screen  

## 2023-04-15 NOTE — Telephone Encounter (Signed)
erroneous

## 2023-04-15 NOTE — Progress Notes (Signed)
Patient informed that the ultrasound is considered a limited obstetric ultrasound and is not intended to be a complete ultrasound exam.  Patient also informed that the ultrasound is not being completed with the intent of assessing for fetal or placental anomalies or any pelvic abnormalities. Explained that the purpose of today's ultrasound is to assess for fetal well being.  Patient acknowledges the purpose of the exam and the limitations of the study.       BPP 10/10   Has IOL on 04/17/23.   Scheryl Marten, RN

## 2023-04-15 NOTE — Telephone Encounter (Signed)
Received message on results that IOL needed to be moved up due to pt now having cholestasis. Dr Shawnie Pons made aware that 04/17/23 was first opening and made sure she did not want to direct admit pt. Per Dr Shawnie Pons IOL on 12/8 was good and we also needed to see if pt could come in today for BPP/NST.   Called pt to let her know her results and that we moved up her IOL to 12/8 due to her new results, and also asked if she could come in today for BPP/NST. Pt able to come in around 10am.   Scheryl Marten, RN

## 2023-04-17 ENCOUNTER — Inpatient Hospital Stay (HOSPITAL_COMMUNITY)
Admission: RE | Admit: 2023-04-17 | Discharge: 2023-04-20 | DRG: 806 | Disposition: A | Discharge status: Home / Self Care | Payer: MEDICAID | Attending: Family Medicine | Admitting: Family Medicine

## 2023-04-17 ENCOUNTER — Inpatient Hospital Stay (HOSPITAL_COMMUNITY): Payer: MEDICAID

## 2023-04-17 ENCOUNTER — Encounter (HOSPITAL_COMMUNITY): Payer: Self-pay | Admitting: Obstetrics and Gynecology

## 2023-04-17 DIAGNOSIS — O99344 Other mental disorders complicating childbirth: Secondary | ICD-10-CM | POA: Diagnosis present

## 2023-04-17 DIAGNOSIS — O2662 Liver and biliary tract disorders in childbirth: Secondary | ICD-10-CM | POA: Diagnosis not present

## 2023-04-17 DIAGNOSIS — E7879 Other disorders of bile acid and cholesterol metabolism: Secondary | ICD-10-CM | POA: Diagnosis present

## 2023-04-17 DIAGNOSIS — O26899 Other specified pregnancy related conditions, unspecified trimester: Secondary | ICD-10-CM

## 2023-04-17 DIAGNOSIS — F329 Major depressive disorder, single episode, unspecified: Secondary | ICD-10-CM | POA: Diagnosis present

## 2023-04-17 DIAGNOSIS — K7689 Other specified diseases of liver: Secondary | ICD-10-CM | POA: Diagnosis present

## 2023-04-17 DIAGNOSIS — L299 Pruritus, unspecified: Secondary | ICD-10-CM | POA: Diagnosis present

## 2023-04-17 DIAGNOSIS — Z23 Encounter for immunization: Secondary | ICD-10-CM | POA: Diagnosis not present

## 2023-04-17 DIAGNOSIS — O26643 Intrahepatic cholestasis of pregnancy, third trimester: Secondary | ICD-10-CM | POA: Diagnosis present

## 2023-04-17 DIAGNOSIS — O24420 Gestational diabetes mellitus in childbirth, diet controlled: Principal | ICD-10-CM | POA: Diagnosis present

## 2023-04-17 DIAGNOSIS — O26893 Other specified pregnancy related conditions, third trimester: Secondary | ICD-10-CM | POA: Diagnosis present

## 2023-04-17 DIAGNOSIS — O24410 Gestational diabetes mellitus in pregnancy, diet controlled: Principal | ICD-10-CM | POA: Diagnosis present

## 2023-04-17 DIAGNOSIS — Z3400 Encounter for supervision of normal first pregnancy, unspecified trimester: Secondary | ICD-10-CM

## 2023-04-17 DIAGNOSIS — R7401 Elevation of levels of liver transaminase levels: Secondary | ICD-10-CM | POA: Diagnosis present

## 2023-04-17 DIAGNOSIS — Z833 Family history of diabetes mellitus: Secondary | ICD-10-CM | POA: Diagnosis not present

## 2023-04-17 DIAGNOSIS — F4322 Adjustment disorder with anxiety: Secondary | ICD-10-CM | POA: Diagnosis not present

## 2023-04-17 DIAGNOSIS — Z6791 Unspecified blood type, Rh negative: Secondary | ICD-10-CM

## 2023-04-17 DIAGNOSIS — Z3A38 38 weeks gestation of pregnancy: Secondary | ICD-10-CM | POA: Diagnosis not present

## 2023-04-17 DIAGNOSIS — F411 Generalized anxiety disorder: Secondary | ICD-10-CM | POA: Diagnosis present

## 2023-04-17 DIAGNOSIS — G4700 Insomnia, unspecified: Secondary | ICD-10-CM | POA: Diagnosis present

## 2023-04-17 LAB — GLUCOSE, CAPILLARY
Glucose-Capillary: 116 mg/dL — ABNORMAL HIGH (ref 70–99)
Glucose-Capillary: 86 mg/dL (ref 70–99)
Glucose-Capillary: 73 mg/dL (ref 70–99)
Glucose-Capillary: 76 mg/dL (ref 70–99)

## 2023-04-17 LAB — COMPREHENSIVE METABOLIC PANEL
ALT: 34 U/L (ref 0–44)
AST: 29 U/L (ref 15–41)
Albumin: 2.7 g/dL — ABNORMAL LOW (ref 3.5–5.0)
Alkaline Phosphatase: 98 U/L (ref 38–126)
Anion gap: 11 (ref 5–15)
BUN: <5 mg/dL — ABNORMAL LOW (ref 6–20)
CO2: 19 mmol/L — ABNORMAL LOW (ref 22–32)
Calcium: 8.9 mg/dL (ref 8.9–10.3)
Chloride: 106 mmol/L (ref 98–111)
Creatinine, Ser: 0.42 mg/dL — ABNORMAL LOW (ref 0.44–1.00)
GFR, Estimated: >60 mL/min (ref >60)
Glucose, Bld: 114 mg/dL — ABNORMAL HIGH (ref 70–99)
Potassium: 3.3 mmol/L — ABNORMAL LOW (ref 3.5–5.1)
Sodium: 136 mmol/L (ref 135–145)
Total Bilirubin: 0.5 mg/dL (ref <1.2)
Total Protein: 6.1 g/dL — ABNORMAL LOW (ref 6.5–8.1)

## 2023-04-17 LAB — TYPE AND SCREEN
ABO/RH(D): A NEG
Antibody Screen: POSITIVE

## 2023-04-17 LAB — CBC
HCT: 32.2 % — ABNORMAL LOW (ref 36.0–46.0)
Hemoglobin: 10.4 g/dL — ABNORMAL LOW (ref 12.0–15.0)
MCH: 26.3 pg (ref 26.0–34.0)
MCHC: 32.3 g/dL (ref 30.0–36.0)
MCV: 81.5 fL (ref 80.0–100.0)
Platelets: 362 10*3/uL (ref 150–400)
RBC: 3.95 MIL/uL (ref 3.87–5.11)
RDW: 14.6 % (ref 11.5–15.5)
WBC: 11.1 10*3/uL — ABNORMAL HIGH (ref 4.0–10.5)
nRBC: 0 % (ref 0.0–0.2)

## 2023-04-17 MED ORDER — LACTATED RINGERS IV SOLN: INTRAVENOUS | Status: DC

## 2023-04-17 MED ORDER — PHENYLEPHRINE 80 MCG/ML (10ML) SYRINGE FOR IV PUSH (FOR BLOOD PRESSURE SUPPORT)
80.0000 ug | PREFILLED_SYRINGE | INTRAVENOUS | Status: DC | PRN
  Filled 2023-04-17: qty 10

## 2023-04-17 MED ORDER — MISOPROSTOL 25 MCG QUARTER TABLET
25.0000 ug | ORAL_TABLET | Freq: Once | ORAL | Status: AC
  Administered 2023-04-17: 25 ug via VAGINAL
  Filled 2023-04-17: qty 1

## 2023-04-17 MED ORDER — SOD CITRATE-CITRIC ACID 500-334 MG/5ML PO SOLN: 30.0000 mL | ORAL | Status: DC | PRN

## 2023-04-17 MED ORDER — EPHEDRINE 5 MG/ML INJ: 10.0000 mg | INTRAVENOUS | Status: DC | PRN

## 2023-04-17 MED ORDER — MISOPROSTOL 50MCG HALF TABLET
50.0000 ug | ORAL_TABLET | Freq: Once | ORAL | Status: AC
  Administered 2023-04-17: 50 ug via ORAL
  Filled 2023-04-17: qty 1

## 2023-04-17 MED ORDER — FENTANYL CITRATE (PF) 100 MCG/2ML IJ SOLN
50.0000 ug | INTRAMUSCULAR | Status: DC | PRN
  Administered 2023-04-18: 50 ug via INTRAVENOUS
  Filled 2023-04-17: qty 2

## 2023-04-17 MED ORDER — OXYTOCIN-SODIUM CHLORIDE 30-0.9 UT/500ML-% IV SOLN
2.5000 [IU]/h | INTRAVENOUS | Status: DC
  Filled 2023-04-17: qty 500

## 2023-04-17 MED ORDER — LACTATED RINGERS IV SOLN: 500.0000 mL | INTRAVENOUS | Status: DC | PRN

## 2023-04-17 MED ORDER — TERBUTALINE SULFATE 1 MG/ML IJ SOLN: 0.2500 mg | Freq: Once | INTRAMUSCULAR | Status: DC | PRN

## 2023-04-17 MED ORDER — DIPHENHYDRAMINE HCL 50 MG/ML IJ SOLN: 12.5000 mg | INTRAMUSCULAR | Status: DC | PRN

## 2023-04-17 MED ORDER — LACTATED RINGERS IV SOLN: 500.0000 mL | Freq: Once | INTRAVENOUS | Status: AC

## 2023-04-17 MED ORDER — OXYTOCIN BOLUS FROM INFUSION
333.0000 mL | Freq: Once | INTRAVENOUS | Status: AC
Start: 2023-04-17 — End: 2023-04-18
  Administered 2023-04-18: 333 mL via INTRAVENOUS

## 2023-04-17 MED ORDER — FENTANYL-BUPIVACAINE-NACL 0.5-0.125-0.9 MG/250ML-% EP SOLN
12.0000 mL/h | EPIDURAL | Status: DC | PRN
  Administered 2023-04-18: 12 mL/h via EPIDURAL
  Filled 2023-04-17: qty 250

## 2023-04-17 MED ORDER — ACETAMINOPHEN 325 MG PO TABS
650.0000 mg | ORAL_TABLET | ORAL | Status: DC | PRN
Start: 2023-04-17 — End: 2023-04-18

## 2023-04-17 MED ORDER — URSODIOL 300 MG PO CAPS
600.0000 mg | ORAL_CAPSULE | Freq: Two times a day (BID) | ORAL | Status: DC
  Administered 2023-04-17 – 2023-04-18 (×2): 600 mg via ORAL
  Filled 2023-04-17 (×3): qty 2

## 2023-04-17 MED ORDER — PHENYLEPHRINE 80 MCG/ML (10ML) SYRINGE FOR IV PUSH (FOR BLOOD PRESSURE SUPPORT): 80.0000 ug | PREFILLED_SYRINGE | INTRAVENOUS | Status: DC | PRN

## 2023-04-17 MED ORDER — CEPHALEXIN 500 MG PO CAPS
500.0000 mg | ORAL_CAPSULE | Freq: Every day | ORAL | Status: DC
  Administered 2023-04-17: 500 mg via ORAL
  Filled 2023-04-17 (×2): qty 1

## 2023-04-17 MED ORDER — ONDANSETRON HCL 4 MG/2ML IJ SOLN
4.0000 mg | Freq: Four times a day (QID) | INTRAMUSCULAR | Status: DC | PRN
  Administered 2023-04-17: 4 mg via INTRAVENOUS
  Filled 2023-04-17: qty 2

## 2023-04-17 MED ORDER — LIDOCAINE HCL (PF) 1 % IJ SOLN: 30.0000 mL | INTRAMUSCULAR | Status: DC | PRN

## 2023-04-17 NOTE — H&P (Addendum)
Julie Rios is a 22 y.o. G1P0 female at [redacted]w[redacted]d by LMP c/w [place] at 6 weeks presenting for IOL for GDMA1 and Cholestasis with elevated bile acids .   Reports active fetal movement, contractions: none, vaginal bleeding: none, membranes: intact.  Initiated prenatal care at [facility] at 10 wks.   Most recent u/s 04/03/2024: AFI 18.6, HC 29% AC 26% Anterior Placenta. BPP 8/8 EFW stable  This pregnancy complicated by: Depression and Anxiety  Rh negative state  Asymptomatic bacteruria in pregnancy Pyelonephritis in second trimester  Seizure life activity  Gestations DM A1  Cholestasis, Cholelithiasis     Past Medical History: Past Medical History:  Diagnosis Date   Anxiety    Depression    MDD (major depressive disorder), single episode, moderate (HCC)     Past Surgical History: Past Surgical History:  Procedure Laterality Date   NO PAST SURGERIES      Obstetrical History: OB History     Gravida  1   Para      Term      Preterm      AB      Living         SAB      IAB      Ectopic      Multiple      Live Births              Social History: Social History   Socioeconomic History   Marital status: Single    Spouse name: Casimiro Needle   Number of children: Not on file   Years of education: Not on file   Highest education level: Not on file  Occupational History   Not on file  Tobacco Use   Smoking status: Never    Passive exposure: Yes   Smokeless tobacco: Never  Vaping Use   Vaping status: Some Days   Substances: Nicotine  Substance and Sexual Activity   Alcohol use: No   Drug use: Yes    Types: Other-see comments, Marijuana    Comment: reports use of THC-A approx. nightly   Sexual activity: Yes    Birth control/protection: None  Other Topics Concern   Not on file  Social History Narrative   Not on file   Social Determinants of Health   Financial Resource Strain: Not on file  Food Insecurity: Food Insecurity Present (04/17/2023)   Hunger  Vital Sign    Worried About Running Out of Food in the Last Year: Sometimes true    Ran Out of Food in the Last Year: Never true  Transportation Needs: No Transportation Needs (04/17/2023)   PRAPARE - Administrator, Civil Service (Medical): No    Lack of Transportation (Non-Medical): No  Physical Activity: Not on file  Stress: Not on file  Social Connections: Unknown (09/22/2021)   Received from [facility]   Social Network    Social Network: Not on file    Family History: Family History  Problem Relation Age of Onset   Diabetes Mother    Cirrhosis Mother    Diabetes Father     Allergies: No Known Allergies  Medications Prior to Admission  Medication Sig Dispense Refill Last Dose   Accu-Chek Softclix Lancets lancets 1 each by Other route 4 (four) times daily. 100 each 12    acetaminophen (TYLENOL) 325 MG tablet Take 2 tablets (650 mg total) by mouth every 8 (eight) hours as needed for mild pain, fever or moderate pain. (Patient  not taking: Reported on 12/22/2022) 30 tablet 0    Blood Glucose Monitoring Suppl (ACCU-CHEK GUIDE) w/Device KIT 1 Device by Does not apply route 4 (four) times daily. 1 kit 0    cephALEXin (KEFLEX) 500 MG capsule Take 1 capsule (500 mg total) by mouth at bedtime. Continue this for the rest of the pregnancy 30 capsule 4    famotidine (PEPCID) 20 MG tablet Take 1 tablet (20 mg total) by mouth 2 (two) times daily. 60 tablet 3    feeding supplement, GLUCERNA SHAKE, (GLUCERNA SHAKE) LIQD Take 237 mLs by mouth 3 (three) times daily between meals. Dispense 1 month supply (Patient not taking: Reported on 03/23/2023) 237 mL 3    glucose blood (ACCU-CHEK GUIDE) test strip Use to check blood sugars four times a day was instructed 100 each 3    metoCLOPramide (REGLAN) 10 MG tablet Take 1 tablet (10 mg total) by mouth 3 (three) times daily before meals. 90 tablet 2    ondansetron (ZOFRAN-ODT) 4 MG disintegrating tablet Take 1 tablet (4 mg  total) by mouth every 6 (six) hours as needed for nausea. 40 tablet 1    Prenatal Vit-Fe Fumarate-FA (PRENATAL VITAMIN) 27-0.8 MG TABS Take 1 tablet by mouth daily. 30 tablet 0    prochlorperazine (COMPAZINE) 10 MG tablet Take 1 tablet (10 mg total) by mouth every 6 (six) hours as needed for nausea or vomiting. (Patient not taking: Reported on 01/17/2023) 30 tablet 0    ursodiol (ACTIGALL) 500 MG tablet Take 1 tablet (500 mg total) by mouth 2 (two) times daily. 60 tablet 1     Review of Systems  Pertinent pos/neg as indicated in HPI  Blood pressure 114/68, pulse 95, temperature 98 F (36.7 C), temperature source Oral, resp. rate 18, height 5\' 4"  (1.626 m), weight 74 kg, last menstrual period 07/24/2022. General appearance: alert, cooperative, and no distress Lungs:Comfortable work of breathing, speaks in full sentences, no audible wheeze  Heart: regular rate and rhythm, appears well perfused  Abdomen: gravid, soft, non-tender SVE: 1/20/-3     Fetal monitoring: FHR: 120 bpm, variability: moderate,  Accelerations: Present,  decelerations:  Absent Uterine activity: Frequency: Every 3-5 minutes   Presentation: cephalic   Prenatal labs: ABO, Rh: --/--/PENDING (12/08 1038) Antibody: PENDING (12/08 1038) Rubella: 1.38 (05/29 1133) RPR: Non Reactive (09/24 0826)  HBsAg: Negative (11/27 1427)  HIV: Non Reactive (09/24 0826)  GBS: Negative/-- (11/27 1420)  2hr GTT: 90/180/135 Pap Smear: 10/27/2022 - ASCUS   Prenatal Transfer Tool  Maternal Diabetes: Yes:  Diabetes Type:  Diet controlled. A1c 5.1 Genetic Screening: Normal NIPS: LR, Horizon neg x4  Maternal Ultrasounds/Referrals: Normal Fetal Ultrasounds or other Referrals:  None Maternal Substance Abuse:  No Significant Maternal Medications:  None Significant Maternal Lab Results: Group B Strep negative and Rh negative  Results for orders placed or performed during the hospital encounter of 04/17/23 (from the past 24 hour(s))   Glucose, capillary   Collection Time: 04/17/23 10:14 AM  Result Value Ref Range   Glucose-Capillary 116 (H) 70 - 99 mg/dL  Type and screen   Collection Time: 04/17/23 10:38 AM  Result Value Ref Range   ABO/RH(D) PENDING    Antibody Screen PENDING    Sample Expiration      04/20/2023,2359 Performed at [facility], [address] [place], [place] [address]      Assessment:  [redacted]w[redacted]d SIUP  G1P0  Cat 1 FHR  GBS Negative/-- (11/27 1420)  Plan:  Admit  to L&D  Dual Cytotec at 1215   IV pain meds/epidural prn active labor  Anticipate SVD    Plans to breastfeed  Contraception: undecided  ID: GBS negative    # GDMA1 - CBGs q4 while in latent labor # MDD / GAD - not currently on any meds, offer PP mood check  # Rh Negative - plan Rhogam studies  # Cholestasis - continue ursadiol during labor # Transaminitis - LFTs elevated during pregnancy; Last: Alk phos 141, AST 48, ALT 62 # Abnormal pap - ASCUS, plan post partum pap   Nelta Numbers MD  Visiting Resident PGY3 - Family Medicine  04/17/2023, 11:07 AM  Evaluation and management procedures were performed by the [facility] Medicine Resident under my supervision. I was immediately available for direct supervision, assistance and direction throughout this encounter.  I also confirm that I have verified the information documented in the resident's note, and that I have also personally reperformed the pertinent components of the physical exam and all of the medical decision making activities.  I have also made any necessary editorial changes.   Mittie Bodo, MD Family Medicine - Obstetrics Fellow

## 2023-04-17 NOTE — Progress Notes (Signed)
CBG 116. Pt stated she had pancakes and syrup before arrival to hospital.

## 2023-04-17 NOTE — Progress Notes (Addendum)
Labor Progress Note  Julie Rios is a 22 y.o. G1P0 at [redacted]w[redacted]d presented for IOL for GDMA1 and cholestasis   S: Resting, doing well. Nervous and anxious, intermittently tearful.   O:  BP 116/71   Pulse 92   Temp 97.8 F (36.6 C) (Oral)   Resp 20   Ht 5\' 4"  (1.626 m)   Wt 74 kg   LMP 07/24/2022   BMI 28.00 kg/m  EFM:130 bpm/Moderate variability/ 15x15 accels/ None decels CAT: 1 Toco: regular, every 3 minutes   CVE: Dilation: 2 Effacement (%): 50 Cervical Position: Middle Station: -3 Presentation: Vertex Exam by:: Mikkelson MD   [name]: 22 y.o. G1P0 [redacted]w[redacted]d  here for IOL for GDMA1 and Cholestasis as above  #Labor: Progressing well. Dr Judd Lien placed Cooks balloon placed 1650 with 80/0. Contracting regularly will hold off on additional cytotec. Augment with Cyto vs AROM / Pit if indicated otherwise expectant management  #Pain: Per patient choice; discussed nitrous, IV, epidural  #FWB: CAT 1 #GBS negative #GDMA1 - CBGs reassuring (161-09). Continue q4 hours while in latent labor    Nelta Numbers, MD Visiting Resident PGY3 - Family Medicine [facility], Center for [facility] 04/17/23  4:59 PM    Evaluation and management procedures were performed by the [facility] Medicine Resident under my supervision. I was immediately available for direct supervision, assistance and direction throughout this encounter.  I also confirm that I have verified the information documented in the resident's note, and that I have also personally reperformed the pertinent components of the physical exam and all of the medical decision making activities.  I have also made any necessary editorial changes.   Mittie Bodo, MD Family Medicine - Obstetrics Fellow

## 2023-04-17 NOTE — Progress Notes (Signed)
Labor Progress Note  Julie Rios is a 22 y.o. G1P0 at [redacted]w[redacted]d presented for IOL for GDMA1 and cholestasis   S: Resting, family at bedside.  O:  BP 109/61   Pulse 69   Temp 98 F (36.7 C) (Oral)   Resp 16   Ht 5\' 4"  (1.626 m)   Wt 74 kg   LMP 07/24/2022   SpO2 100%   BMI 28.00 kg/m  EFM:125/mod/+a/-d Toco: q1-3 min   CVE: Dilation: 2 Effacement (%): 50 Cervical Position: Middle Station: -3 Presentation: Vertex Exam by:: Mikkelson MD   [name]: 22 y.o. G1P0 [redacted]w[redacted]d  here for IOL for GDMA1 and Cholestasis as above  #Labor: Progressing well. Adriana Simas still in place, will discuss AROM + Pit once FB out #Pain: Epidural  #FWB: CAT 1 #GBS negative #GDMA1 - CBGs reassuring (643-32). Continue q4 hours while in latent labor    Sundra Aland, MD OB Fellow, Faculty Practice [facility], Center for [facility]  04/17/23  8:59 PM

## 2023-04-18 ENCOUNTER — Inpatient Hospital Stay (HOSPITAL_COMMUNITY): Payer: MEDICAID | Admitting: Anesthesiology

## 2023-04-18 ENCOUNTER — Other Ambulatory Visit: Payer: Self-pay

## 2023-04-18 ENCOUNTER — Encounter (HOSPITAL_COMMUNITY): Payer: Self-pay | Admitting: Obstetrics and Gynecology

## 2023-04-18 ENCOUNTER — Ambulatory Visit: Payer: MEDICAID

## 2023-04-18 DIAGNOSIS — O2662 Liver and biliary tract disorders in childbirth: Secondary | ICD-10-CM

## 2023-04-18 DIAGNOSIS — O24420 Gestational diabetes mellitus in childbirth, diet controlled: Secondary | ICD-10-CM

## 2023-04-18 DIAGNOSIS — Z3A38 38 weeks gestation of pregnancy: Secondary | ICD-10-CM

## 2023-04-18 DIAGNOSIS — O99344 Other mental disorders complicating childbirth: Secondary | ICD-10-CM

## 2023-04-18 LAB — GLUCOSE, CAPILLARY
Glucose-Capillary: 80 mg/dL (ref 70–99)
Glucose-Capillary: 76 mg/dL (ref 70–99)
Glucose-Capillary: 97 mg/dL (ref 70–99)

## 2023-04-18 LAB — RPR: RPR Ser Ql: NONREACTIVE

## 2023-04-18 MED ORDER — IBUPROFEN 600 MG PO TABS
600.0000 mg | ORAL_TABLET | Freq: Four times a day (QID) | ORAL | Status: DC
  Administered 2023-04-18 – 2023-04-20 (×8): 600 mg via ORAL
  Filled 2023-04-18 (×8): qty 1

## 2023-04-18 MED ORDER — SODIUM CHLORIDE 0.9% FLUSH: 10.0000 mL | Freq: Two times a day (BID) | INTRAVENOUS | Status: DC

## 2023-04-18 MED ORDER — FENTANYL-BUPIVACAINE-NACL 0.5-0.125-0.9 MG/250ML-% EP SOLN: 12.0000 mL/h | EPIDURAL | Status: DC | PRN

## 2023-04-18 MED ORDER — DIBUCAINE (PERIANAL) 1 % EX OINT: 1.0000 | TOPICAL_OINTMENT | CUTANEOUS | Status: DC | PRN

## 2023-04-18 MED ORDER — ONDANSETRON HCL 4 MG PO TABS: 4.0000 mg | ORAL_TABLET | ORAL | Status: DC | PRN

## 2023-04-18 MED ORDER — TETANUS-DIPHTH-ACELL PERTUSSIS 5-2.5-18.5 LF-MCG/0.5 IM SUSY
0.5000 mL | PREFILLED_SYRINGE | Freq: Once | INTRAMUSCULAR | Status: AC
  Administered 2023-04-19: 0.5 mL via INTRAMUSCULAR
  Filled 2023-04-18: qty 0.5

## 2023-04-18 MED ORDER — PRENATAL MULTIVITAMIN CH
1.0000 | ORAL_TABLET | Freq: Every day | ORAL | Status: DC
  Administered 2023-04-19 – 2023-04-20 (×2): 1 via ORAL
  Filled 2023-04-18 (×2): qty 1

## 2023-04-18 MED ORDER — COCONUT OIL OIL: 1.0000 | TOPICAL_OIL | Status: DC | PRN

## 2023-04-18 MED ORDER — DIPHENHYDRAMINE HCL 50 MG/ML IJ SOLN: 12.5000 mg | INTRAMUSCULAR | Status: DC | PRN

## 2023-04-18 MED ORDER — WITCH HAZEL-GLYCERIN EX PADS: 1.0000 | MEDICATED_PAD | CUTANEOUS | Status: DC | PRN

## 2023-04-18 MED ORDER — SIMETHICONE 80 MG PO CHEW: 80.0000 mg | CHEWABLE_TABLET | ORAL | Status: DC | PRN

## 2023-04-18 MED ORDER — BENZOCAINE-MENTHOL 20-0.5 % EX AERO
1.0000 | INHALATION_SPRAY | CUTANEOUS | Status: DC | PRN
  Administered 2023-04-18 – 2023-04-20 (×2): 1 via TOPICAL
  Filled 2023-04-18 (×2): qty 56

## 2023-04-18 MED ORDER — ZOLPIDEM TARTRATE 5 MG PO TABS
5.0000 mg | ORAL_TABLET | Freq: Every evening | ORAL | Status: DC | PRN
Start: 2023-04-18 — End: 2023-04-20

## 2023-04-18 MED ORDER — DIPHENHYDRAMINE HCL 25 MG PO CAPS: 25.0000 mg | ORAL_CAPSULE | Freq: Four times a day (QID) | ORAL | Status: DC | PRN

## 2023-04-18 MED ORDER — SENNOSIDES-DOCUSATE SODIUM 8.6-50 MG PO TABS
2.0000 | ORAL_TABLET | ORAL | Status: DC
  Administered 2023-04-19 – 2023-04-20 (×2): 2 via ORAL
  Filled 2023-04-18 (×2): qty 2

## 2023-04-18 MED ORDER — SODIUM CHLORIDE 0.9% FLUSH: 3.0000 mL | INTRAVENOUS | Status: DC | PRN

## 2023-04-18 MED ORDER — ONDANSETRON HCL 4 MG/2ML IJ SOLN: 4.0000 mg | INTRAMUSCULAR | Status: DC | PRN

## 2023-04-18 MED ORDER — TERBUTALINE SULFATE 1 MG/ML IJ SOLN: 0.2500 mg | Freq: Once | INTRAMUSCULAR | Status: DC | PRN

## 2023-04-18 MED ORDER — INFLUENZA VIRUS VACC SPLIT PF (FLUZONE) 0.5 ML IM SUSY
0.5000 mL | PREFILLED_SYRINGE | INTRAMUSCULAR | Status: AC
  Administered 2023-04-19: 0.5 mL via INTRAMUSCULAR
  Filled 2023-04-18: qty 0.5

## 2023-04-18 MED ORDER — LIDOCAINE HCL (PF) 1 % IJ SOLN
INTRAMUSCULAR | Status: DC | PRN
  Administered 2023-04-18: 8 mL via EPIDURAL

## 2023-04-18 MED ORDER — FAMOTIDINE 20 MG PO TABS
20.0000 mg | ORAL_TABLET | Freq: Two times a day (BID) | ORAL | Status: DC
  Administered 2023-04-18 – 2023-04-20 (×4): 20 mg via ORAL
  Filled 2023-04-18 (×4): qty 1

## 2023-04-18 MED ORDER — MEASLES, MUMPS & RUBELLA VAC IJ SOLR: 0.5000 mL | Freq: Once | INTRAMUSCULAR | Status: DC

## 2023-04-18 MED ORDER — OXYTOCIN-SODIUM CHLORIDE 30-0.9 UT/500ML-% IV SOLN
1.0000 m[IU]/min | INTRAVENOUS | Status: DC
Start: 2023-04-18 — End: 2023-04-18
  Administered 2023-04-18: 2 m[IU]/min via INTRAVENOUS

## 2023-04-18 MED ORDER — ACETAMINOPHEN 325 MG PO TABS: 650.0000 mg | ORAL_TABLET | ORAL | Status: DC | PRN

## 2023-04-18 MED ORDER — SODIUM CHLORIDE 0.9% FLUSH
3.0000 mL | Freq: Two times a day (BID) | INTRAVENOUS | Status: DC
  Administered 2023-04-19: 3 mL via INTRAVENOUS

## 2023-04-18 NOTE — Lactation Note (Signed)
This note was copied from a baby's chart. Lactation Consultation Note  Patient Name: Julie Rios [id number] Date: 04/18/2023 Age:22 hours Reason for consult: Initial assessment;Primapara;1st time breastfeeding;Early term 37-38.6wks;Breastfeeding assistance  P1- MOB reports that infant has nursed a few times since birth, but does not seem to open her mouth wide enough. Infant was spitty at the bedside, so LC assisted infant by suctioning some mucus. LC then assisted placing infant on the left breast in the football hold. Infant would not latch at all. MOB and LC attempted to latch infant for 15 minutes with no success. At this time, LC encouraged hand expression to offer infant some sort of food. MOB agreed.  As LC assisted MOB with hand expression, infant started gagging/choking on a thick blob of mucus. LC had to bulb suction her a few times to assist in clearing it. No color change was noted. LC reviewed how this extra fluid can attribute to poor feedings sometimes. Once infant was stable, LC finished expressing MOB's breasts. LC was able to collect 5.5 mL EBM. MOB is okay with using a bottle, so LC bottle fed infant EBM. Infant tolerated feeding well, but was a little uncoordinated.  LC reviewed feeding infant on cue 8-12x in 24 hrs, not allowing infant to go over 3 hrs without a feeding, CDC milk storage guidelines, LC services handout and CDC milk storage guidelines. MOB was concerned over her THC use during pregnancy how how it would affect her milk. LC provided MOB with information on THC use and breastfeeding through Dr. Martin Majestic medication and breastfeeding book. LC encouraged MOB to call for further assistance as needed.  Maternal Data Has patient been taught Hand Expression?: Yes Does the patient have breastfeeding experience prior to this delivery?: No  Feeding Mother's Current Feeding Choice: Breast Milk Nipple Type: Slow - flow  LATCH Score Latch: Too sleepy or reluctant, no latch  achieved, no sucking elicited.  Audible Swallowing: None  Type of Nipple: Everted at rest and after stimulation  Comfort (Breast/Nipple): Soft / non-tender  Hold (Positioning): Full assist, staff holds infant at breast  LATCH Score: 4   Lactation Tools Discussed/Used Pump Education: Milk Storage  Interventions Interventions: Breast feeding basics reviewed;Assisted with latch;Hand express;Breast compression;Adjust position;Support pillows;Position options;Expressed milk;Education;Pace feeding;LC Services brochure  Discharge Discharge Education: Warning signs for feeding baby Pump:  Irena Cords referral sent)  Consult Status Consult Status: Follow-up Date: 04/19/23 Follow-up type: In-patient    Dema Severin BS, IBCLC 04/18/2023, 6:04 PM

## 2023-04-18 NOTE — Progress Notes (Signed)
Labor Progress Note  Julie Rios is a 22 y.o. G1P0 at [redacted]w[redacted]d presented for IOL for GDMA1 and cholestasis   S: In to check on pt. No concerns or questions at this time.   O:  BP (!) 89/47 (BP Location: Left Arm)   Pulse 70   Temp 97.8 F (36.6 C) (Oral)   Resp 16   Ht 5\' 4"  (1.626 m)   Wt 74 kg   LMP 07/24/2022   SpO2 100%   BMI 28.00 kg/m  EFM:125/mod/+a/-d Toco: q1-4 min   CVE: Dilation: 4 Effacement (%): 50 Cervical Position: Middle Station: Ballotable Presentation: Vertex Exam by:: Dr Lucianne Muss   [name]: 22 y.o. G1P0 [redacted]w[redacted]d  here for IOL for GDMA1 and Cholestasis as above  #Labor: FB out, fetal head ballotable on my exam  will start Pitocin 2x2  plan for repeat check and consider AROM at that time #Pain: Epidural  #FWB: CAT 1 #GBS negative  #GDMA1 - CBGs wnl  cont q4 checks  Sundra Aland, MD OB Fellow, Faculty Practice [facility], Center for [facility]  04/18/23  4:02 AM

## 2023-04-18 NOTE — Plan of Care (Signed)

## 2023-04-18 NOTE — Anesthesia Preprocedure Evaluation (Signed)
Anesthesia Evaluation  Patient identified by MRN, date of birth, ID band Patient awake    Reviewed: Allergy & Precautions, H&P , NPO status , Patient's Chart, lab work & pertinent test results, reviewed documented beta blocker date and time   Airway Mallampati: I  TM Distance: >3 FB Neck ROM: full    Dental no notable dental hx. (+) Teeth Intact, Dental Advisory Given   Pulmonary neg pulmonary ROS   Pulmonary exam normal breath sounds clear to auscultation       Cardiovascular negative cardio ROS Normal cardiovascular exam Rhythm:regular Rate:Normal     Neuro/Psych  PSYCHIATRIC DISORDERS Anxiety Depression    negative neurological ROS  negative psych ROS   GI/Hepatic negative GI ROS, Neg liver ROS,,,  Endo/Other  negative endocrine ROSdiabetes    Renal/GU Renal diseasenegative Renal ROS  negative genitourinary   Musculoskeletal   Abdominal   Peds  Hematology negative hematology ROS (+)   Anesthesia Other Findings   Reproductive/Obstetrics (+) Pregnancy                             Anesthesia Physical Anesthesia Plan  ASA: 2  Anesthesia Plan: Epidural   Post-op Pain Management:    Induction: Intravenous  PONV Risk Score and Plan: Treatment may vary due to age or medical condition  Airway Management Planned: Natural Airway  Additional Equipment:   Intra-op Plan:   Post-operative Plan: Extubation in OR  Informed Consent: I have reviewed the patients History and Physical, chart, labs and discussed the procedure including the risks, benefits and alternatives for the proposed anesthesia with the patient or authorized representative who has indicated his/her understanding and acceptance.       Plan Discussed with: Anesthesiologist  Anesthesia Plan Comments:        Anesthesia Quick Evaluation

## 2023-04-18 NOTE — Anesthesia Procedure Notes (Signed)
Epidural Patient location during procedure: OB Start time: 04/18/2023 6:55 AM End time: 04/18/2023 7:01 AM  Staffing Anesthesiologist: Bethena Midget, MD  Preanesthetic Checklist Completed: patient identified, IV checked, site marked, risks and benefits discussed, surgical consent, monitors and equipment checked, pre-op evaluation and timeout performed  Epidural Patient position: sitting Prep: DuraPrep and site prepped and draped Patient monitoring: continuous pulse ox and blood pressure Approach: midline Location: L4-L5 Injection technique: LOR air  Needle:  Needle type: Tuohy  Needle gauge: 17 G Needle length: 9 cm and 9 Needle insertion depth: 6 cm Catheter type: closed end flexible Catheter size: 19 Gauge Catheter at skin depth: 12 cm Test dose: negative  Assessment Events: blood not aspirated, no cerebrospinal fluid, injection not painful, no injection resistance, no paresthesia and negative IV test

## 2023-04-18 NOTE — Progress Notes (Signed)
Labor Progress Note  Julie Rios is a 22 y.o. G1P0 at [redacted]w[redacted]d presented for IOL for GDMA1 and cholestasis   S: Pt resting, no concerns. Ctx tolerable right now.  O:  BP (!) 89/47 (BP Location: Left Arm)   Pulse 70   Temp 97.8 F (36.6 C) (Oral)   Resp 16   Ht 5\' 4"  (1.626 m)   Wt 74 kg   LMP 07/24/2022   SpO2 100%   BMI 28.00 kg/m  EFM:135/mod/+a/-d Toco: q2-3   CVE: Dilation: 5 Effacement (%): 70 Cervical Position: Middle Station: -2 Presentation: Vertex Exam by:: Dr Lucianne Muss   [name]: 22 y.o. G1P0 [redacted]w[redacted]d  here for IOL for GDMA1 and Cholestasis as above  #Labor: Discussed role of AROM in detail w pt and pt verbally consented  AROM performed with clear/pink fluid #Pain: Desires epidural  #FWB: CAT 1 #GBS negative  #GDMA1 - CBGs wnl  cont q4 checks  Sundra Aland, MD OB Fellow, Faculty Practice [facility], Center for [facility]  04/18/23  5:46 AM

## 2023-04-18 NOTE — Telephone Encounter (Signed)
Called pt and she did not answer.  Message left stating that I am calling regarding a referral appointment which has been scheduled. She was requested to call back to discuss the details. Per chart review, pt has read the Mychart notification for appt @ [facility] on 12/19.

## 2023-04-19 ENCOUNTER — Encounter: Payer: MEDICAID | Admitting: Obstetrics and Gynecology

## 2023-04-19 LAB — CBC
HCT: 28.4 % — ABNORMAL LOW (ref 36.0–46.0)
Hemoglobin: 9.3 g/dL — ABNORMAL LOW (ref 12.0–15.0)
MCH: 27 pg (ref 26.0–34.0)
MCHC: 32.7 g/dL (ref 30.0–36.0)
MCV: 82.3 fL (ref 80.0–100.0)
Platelets: 292 10*3/uL (ref 150–400)
RBC: 3.45 MIL/uL — ABNORMAL LOW (ref 3.87–5.11)
RDW: 14.7 % (ref 11.5–15.5)
WBC: 20.8 10*3/uL — ABNORMAL HIGH (ref 4.0–10.5)
nRBC: 0 % (ref 0.0–0.2)

## 2023-04-19 MED ORDER — RHO D IMMUNE GLOBULIN 1500 UNIT/2ML IJ SOSY
300.0000 ug | PREFILLED_SYRINGE | Freq: Once | INTRAMUSCULAR | Status: AC
  Administered 2023-04-19: 300 ug via INTRAVENOUS
  Filled 2023-04-19: qty 2

## 2023-04-19 MED ORDER — HYDROXYZINE HCL 25 MG PO TABS
25.0000 mg | ORAL_TABLET | Freq: Three times a day (TID) | ORAL | Status: DC | PRN
  Administered 2023-04-19 – 2023-04-20 (×2): 25 mg via ORAL
  Filled 2023-04-19 (×2): qty 1

## 2023-04-19 NOTE — Lactation Note (Addendum)
This note was copied from a baby's chart. Lactation Consultation Note  Patient Name: Julie Rios [id number] Date: 04/19/2023 Age:22 hours Reason for consult: Follow-up assessment;1st time breastfeeding;Early term 37-38.6wks;Infant < 6lbs  P1, [redacted]w[redacted]d. Mother needing assistance with latching. Reviewed hand expression with good flow of colostrum and unwrapped baby for feeding. Assisted with hand positioning in cross cradle hold. Baby latched with ease and sustained latch for 15 min with intermittent swallows.  Mother happy baby is latching.  After breastfeeding session, assisted with bottle feeding using Nfant white nipple in side lying using paced feeding.  Baby consumed 7 ml.   Reviewed volume guidelines, increasing as infant desires. Demonstrated how to use DEBP, fitted with 24 mm flanges. Recommend post pumping q 3 hours for 15 min.  Suggest calling for help as needed.  Maternal Data Has patient been taught Hand Expression?: Yes Does the patient have breastfeeding experience prior to this delivery?: No  Feeding Mother's Current Feeding Choice: Breast Milk and Formula Nipple Type: Slow - flow  LATCH Score Latch: Grasps breast easily, tongue down, lips flanged, rhythmical sucking.  Audible Swallowing: A few with stimulation  Type of Nipple: Everted at rest and after stimulation  Comfort (Breast/Nipple): Soft / non-tender  Hold (Positioning): Assistance needed to correctly position infant at breast and maintain latch.  LATCH Score: 8   Lactation Tools Discussed/Used Tools: Pump;Flanges Flange Size: 24 Breast pump type: Double-Electric Breast Pump;Manual Pump Education: Setup, frequency, and cleaning Reason for Pumping: stimulation and supplementation Pumping frequency:  (q 3 hours 15 min)  Interventions Interventions: Breast feeding basics reviewed;Assisted with latch;Skin to skin;Hand express;DEBP;Hand pump;Education  Discharge Pump: Manual (given Medela manual,  plans to call insurance, stork declined)  Consult Status Consult Status: Follow-up Date: 04/20/23 Follow-up type: In-patient    Dahlia Byes [facility]  RN IBCLC 04/19/2023, 12:29 PM

## 2023-04-19 NOTE — Progress Notes (Signed)
Maternal Pt reassessed post Atarax dose 25mg . Pt expressed feeling better,more relaxed and able to have a normal conversation with visitors. Emotional support provided.

## 2023-04-19 NOTE — Progress Notes (Addendum)
POSTPARTUM PROGRESS NOTE  Post Partum Day 1  Subjective:  Julie Rios is a 22 y.o. G1P1001 s/p SVD at [redacted]w[redacted]d.  She reports she is doing well. No acute events overnight. She denies any problems with ambulating, voiding or po intake. Denies nausea or vomiting.  Pain is moderately controlled.  Lochia is minimal.  Objective: Blood pressure 115/80, pulse 98, temperature 98.1 F (36.7 C), temperature source Oral, resp. rate 17, height 5\' 4"  (1.626 m), weight 74 kg, last menstrual period 07/24/2022, SpO2 100%, unknown if currently breastfeeding.  Physical Exam:  General: alert, cooperative and no distress Chest: no respiratory distress Heart:regular rate, distal pulses intact Uterine Fundus: firm, appropriately tender DVT Evaluation: No calf swelling or tenderness Extremities: No edema Skin: warm, dry  Recent Labs    04/17/23 1030 04/19/23 0441  HGB 10.4* 9.3*  HCT 32.2* 28.4*    Assessment/Plan: Julie Rios is a 22 y.o. G1P1001 s/p SVD at [redacted]w[redacted]d   PPD#1 - Doing well  Routine postpartum care  # MDD / GAD - not currently on any meds, PP mood check, still needs to meet with SW  # Rh Negative - plan Rhogam prior to DC # Abnormal pap - ASCUS, plan post partum pap   Contraception: Undecided Feeding: Pumping and formula Dispo: Plan for discharge tomorrow.   LOS: 2 days   Gwenlyn Perking, MD  04/19/2023, 8:33 AM   Evaluation and management procedures were performed by the [facility] Medicine Resident under my supervision. I was immediately available for direct supervision, assistance and direction throughout this encounter.  I also confirm that I have verified the information documented in the resident's note, and that I have also personally reperformed the pertinent components of the physical exam and all of the medical decision making activities.  I have also made any necessary editorial changes.   Mittie Bodo, MD Family Medicine - Obstetrics Fellow

## 2023-04-19 NOTE — Progress Notes (Signed)
Pt emotional about extended family concerns. FOB's mother present and very involved in explaining familial circumstances on maternal PT's side. Maternal Pt with H/O MDD ,anxiety and depression. MD called and received orders for Atarax PRN and psychiatric consult.

## 2023-04-19 NOTE — Social Work (Signed)
CSW acknowledged consult and completed a clinical assessment.  There are no barriers to d/c.  Clinical assessment notes will be entered at a later time.  Tymar Polyak, LCSWA Clinical Social Worker [phone]  

## 2023-04-19 NOTE — Anesthesia Postprocedure Evaluation (Signed)
Anesthesia Post Note  Patient: Julie Rios  Procedure(s) Performed: AN AD HOC LABOR EPIDURAL     Patient location during evaluation: Mother Baby Anesthesia Type: Epidural Level of consciousness: awake, oriented and awake and alert Pain management: pain level controlled Vital Signs Assessment: post-procedure vital signs reviewed and stable Respiratory status: spontaneous breathing, respiratory function stable and nonlabored ventilation Cardiovascular status: stable Postop Assessment: no headache, adequate PO intake, able to ambulate, patient able to bend at knees and no apparent nausea or vomiting Anesthetic complications: no   No notable events documented.  Last Vitals:  Vitals:   04/19/23 0155 04/19/23 0540  BP: 112/75 115/80  Pulse: 81 98  Resp:    Temp: 36.9 C 36.7 C  SpO2: 100%     Last Pain:  Vitals:   04/19/23 0540  TempSrc: Oral  PainSc: 0-No pain   Pain Goal:                   Nakari Bracknell

## 2023-04-20 ENCOUNTER — Encounter: Payer: MEDICAID | Admitting: Family Medicine

## 2023-04-20 DIAGNOSIS — F4322 Adjustment disorder with anxiety: Secondary | ICD-10-CM | POA: Diagnosis not present

## 2023-04-20 DIAGNOSIS — F411 Generalized anxiety disorder: Secondary | ICD-10-CM

## 2023-04-20 LAB — RH IG WORKUP (INCLUDES ABO/RH)
Fetal Screen: NEGATIVE
Gestational Age(Wks): 38.2
Unit division: 0

## 2023-04-20 MED ORDER — IBUPROFEN 800 MG PO TABS: 800.0000 mg | ORAL_TABLET | Freq: Three times a day (TID) | ORAL | 2 refills | Status: DC | PRN

## 2023-04-20 MED ORDER — TRAZODONE 25 MG HALF TABLET: 25.0000 mg | ORAL_TABLET | Freq: Every evening | ORAL | Status: DC | PRN

## 2023-04-20 MED ORDER — HYDROXYZINE HCL 25 MG PO TABS: 25.0000 mg | ORAL_TABLET | Freq: Three times a day (TID) | ORAL | 2 refills | Status: DC | PRN

## 2023-04-20 MED ORDER — ACETAMINOPHEN 500 MG PO TABS: 1000.0000 mg | ORAL_TABLET | Freq: Three times a day (TID) | ORAL | 2 refills | Status: DC | PRN

## 2023-04-20 MED ORDER — SENNOSIDES-DOCUSATE SODIUM 8.6-50 MG PO TABS: 2.0000 | ORAL_TABLET | Freq: Every evening | ORAL | 1 refills | Status: DC | PRN

## 2023-04-20 MED ORDER — TRAZODONE HCL 50 MG PO TABS: 25.0000 mg | ORAL_TABLET | Freq: Every evening | ORAL | 0 refills | Status: DC | PRN

## 2023-04-20 NOTE — Discharge Summary (Signed)
Postpartum Discharge Summary  Patient Name: Julie Rios DOB: 21-Sep-2000 MRN: [id number]  Date of admission: 04/17/2023 Delivery date:04/18/2023 Delivering provider: Wyn Forster Date of discharge: 04/20/2023  Admitting diagnosis: GDM, class A1 [O24.410] Intrauterine pregnancy: [redacted]w[redacted]d     Secondary diagnosis:  Principal Problem:   GDM, class A1 Active Problems:   Generalized anxiety disorder   Encounter for supervision of normal first pregnancy   Rh negative state in antepartum period   Transaminitis   Pruritus   SVD (spontaneous vaginal delivery)  Additional problems: None    Discharge diagnosis: Term Pregnancy Delivered                                              Post partum procedures: none Augmentation: AROM and IP Foley Complications: None  Hospital course: Induction of Labor With Vaginal Delivery   22 y.o. yo G1P1001 at [redacted]w[redacted]d was admitted to the hospital 04/17/2023 for induction of labor.  Indication for induction: A1 DM and Cholestasis of pregnancy.  Patient had an labor course complicated by none. Membrane Rupture Time/Date: 5:35 AM,04/18/2023  Delivery Method:Vaginal, Spontaneous Operative Delivery:N/A Episiotomy: None Lacerations:  2nd degree;Labial Details of delivery can be found in separate delivery note.  Patient had a postpartum course complicated by none. Known anxiety; seen by SW and psych while inpatient. Patient is discharged home 04/20/23.  Newborn Data: Birth date:04/18/2023 Birth time:1:37 PM Gender:Female Living status:Living Apgars:8 ,9  Weight:2700 g  Magnesium Sulfate received: No BMZ received: No Rhophylac:Yes MMR:No T-DaP:Given prenatally Flu: Yes RSV Vaccine received: No Transfusion:No  Immunizations received: Immunization History  Administered Date(s) Administered   Influenza, Seasonal, Injecte, Preservative Fre 04/19/2023   Rabies, IM 02/05/2021, 02/08/2021, 02/12/2021, 02/19/2021   Tdap 02/02/2021, 04/19/2023    Physical  exam  Vitals:   04/19/23 0540 04/19/23 1531 04/19/23 2118 04/20/23 0506  BP: 115/80 118/84 109/69 112/87  Pulse: 98 99 87 98  Resp:  18 18 18   Temp: 98.1 F (36.7 C) 98 F (36.7 C) 97.9 F (36.6 C) 98 F (36.7 C)  TempSrc: Oral Oral Oral Oral  SpO2:   96% 99%  Weight:      Height:       General: alert and cooperative. Visibly anxious Lochia: appropriate Uterine Fundus: firm Incision: N/A DVT Evaluation: No evidence of DVT seen on physical exam. Labs: Lab Results  Component Value Date   WBC 20.8 (H) 04/19/2023   HGB 9.3 (L) 04/19/2023   HCT 28.4 (L) 04/19/2023   MCV 82.3 04/19/2023   PLT 292 04/19/2023      Latest Ref Rng & Units 04/17/2023   10:30 AM  CMP  Glucose 70 - 99 mg/dL 409   BUN 6 - 20 mg/dL <5   Creatinine 8.11 - 1.00 mg/dL 9.14   Sodium 782 - 956 mmol/L 136   Potassium 3.5 - 5.1 mmol/L 3.3   Chloride 98 - 111 mmol/L 106   CO2 22 - 32 mmol/L 19   Calcium 8.9 - 10.3 mg/dL 8.9   Total Protein 6.5 - 8.1 g/dL 6.1   Total Bilirubin <2.1 mg/dL 0.5   Alkaline Phos 38 - 126 U/L 98   AST 15 - 41 U/L 29   ALT 0 - 44 U/L 34    Edinburgh Score:    04/18/2023    7:16 PM  Edinburgh Postnatal Depression Scale Screening Tool  I have been able to laugh and see the funny side of things. 1  I have looked forward with enjoyment to things. 1  I have blamed myself unnecessarily when things went wrong. 2  I have been anxious or worried for no good reason. 2  I have felt scared or panicky for no good reason. 3  Things have been getting on top of me. 2  I have been so unhappy that I have had difficulty sleeping. 0  I have felt sad or miserable. 1  I have been so unhappy that I have been crying. 1  The thought of harming myself has occurred to me. 1  Edinburgh Postnatal Depression Scale Total 14   No data recorded  After visit meds:  Allergies as of 04/20/2023   No Known Allergies      Medication List     STOP taking these medications    famotidine 20 MG  tablet Commonly known as: PEPCID       TAKE these medications    acetaminophen 500 MG tablet Commonly known as: TYLENOL Take 2 tablets (1,000 mg total) by mouth every 8 (eight) hours as needed for moderate pain (pain score 4-6).   hydrOXYzine 25 MG tablet Commonly known as: ATARAX Take 1 tablet (25 mg total) by mouth 3 (three) times daily as needed for anxiety.   ibuprofen 800 MG tablet Commonly known as: ADVIL Take 1 tablet (800 mg total) by mouth every 8 (eight) hours as needed.   senna-docusate 8.6-50 MG tablet Commonly known as: Senokot-S Take 2 tablets by mouth at bedtime as needed for mild constipation.         Discharge home in stable condition Infant Feeding: Bottle Infant Disposition:home with mother Discharge instruction: per After Visit Summary and Postpartum booklet. Activity: Advance as tolerated. Pelvic rest for 6 weeks.  Diet: routine diet Future Appointments: Future Appointments  Date Time Provider Department Center  04/28/2023  2:30 PM Meredith Pel, NP [facility]  06/08/2023  1:00 PM [place], Shawn Route [facility] None   Follow up Visit:  Message sent to [facility] 12/11  Please schedule this patient for a In person postpartum visit in 6 weeks with the following provider: Any provider. Additional Postpartum F/U:Postpartum Depression checkup 2 weeks High risk pregnancy complicated by: GDM Delivery mode:  Vaginal, Spontaneous Anticipated Birth Control:  Unsure   04/20/2023 Joanne Gavel, MD OB Fellow

## 2023-04-20 NOTE — Social Work (Signed)
CSW received consult for hx of Anxiety and Depression and food insecurity.  CSW met with MOB to offer support and complete assessment. CSW entered the room and observed MOB nursing the infant and FOB an PGM at bedside. CSW introduced self,  CSW role and requested to speak to MOB alone, MOB declined and allowed FOB and PGM to remain in the room during the assessment. CSW inquired abut how MOB was feeling, MOB reported good now that the infant has latched. MOB expressed some frustration with breastfeeding due to the baby not latching properly but after some guidance form the [facility] she feels better.  CSW inquired abut MOB MH hx, MOB reported she was diagnosed in Dec of 2022. MOB reported she was taking Prozac and Wellbutrin prior to pregnancy but stopped when she found out she was pregnant. MOB reported she plans to restart the Wellbutrin but not the Prozac due to it causing her to have auditory hallucinations. CSW inquired abut therapy, MOB reported she is currently in therapy and sees her therapist monthly which can be increased as needed. CSW inquired about MOB EPDS score,MOB reported she has been more irritable over the past 7 days due to hormones. CSW inquired abut MOB's answer to question 10, MOB reported she meant to put Never. CSW assessed for safety, MOB denied any SI or HI.CSW provided education regarding the baby blues period vs. perinatal mood disorders, discussed treatment and gave resources for mental health follow up if concerns arise.  CSW recommends self-evaluation during the postpartum time period using the New Mom Checklist from Postpartum Progress and encouraged MOB to contact a medical professional if symptoms are noted at any time.  MOB identified FOB, PGM and sister as her supports.   CSW inquired about noted food insecurity, MOB reported he received WIC and SNAP benefits. CSW provided MOB with additional food resources in [facility]  CSW provided review of Sudden Infant Death Syndrome  (SIDS) precautions.  MOB identified [facility] for infants follow up care. MOB reported she has all necessary items for the infant including a bassinet and car seat.  CSW identifies no further need for intervention and no barriers to discharge at this time.  Wende Neighbors, LCSWA Clinical Social Worker [phone]

## 2023-04-20 NOTE — Discharge Instructions (Addendum)
Hi! This is the psych team. Here are some of the main points we talked about:  1) Red flags for coming to the urgent care or ED are: suicidal ideations or thoughts of harming babies/others, 24+ hours with no sleep including no naps, hallucinations or confusion, otherwise feeling unsafe. 2) For more information on lactation safety of medications, mothertobaby.com is a good resource. The main risk of what you are taking is baby might be a little more drowsy (both meds) and hydroxzine can impact milk supply; this can be reduced by feeding/pumping right before you take a medication.  3) Have someone else to wake up with baby when you take trazodone; it works best as needed (effects wear off if you take it every night). I had to prescribe 50 mg so you need to cut in half the first time you take it. 4) You are doing a good job - any amount of milk supplies immune benefits.  5) You need to schedule at least 4 hours of protected sleep time daily.   [facility] OUTPATIENT Walk-in information:  Please note, all walk-ins are first come & first serve, with limited number of availability. Therapist for therapy:  Monday & Wednesdays: Please ARRIVE at 7:15 AM for registration Will START at 8:00 AM Every 1st & 2nd Friday of the month: Please ARRIVE at 10:15 AM for registration Will START at 1 PM - 5 PM Psychiatrist for medication management: Monday - Friday:  Please ARRIVE at 7:15 AM for registration Will START at 8:00 AM       Regretfully, due to limited availability, please be aware that you may not been seen on the same day as walk-in. Please consider making an appoint or try again. Thank you for your patience and understanding.  [facility] [place] [address] [place], [place] [address] [phone]  New patients are seen at their walk-in clinic. Walk-in hours are Monday - Friday from 8:30 am - 12:00 pm, and from 1:00 pm - 2:30 pm.   Walk-in  patients are seen on a first come, first served basis, so try to arrive as early as possible for the best chance of being seen the same day.

## 2023-04-20 NOTE — Consult Note (Addendum)
Julie Rios Psychiatry Consult Evaluation  Service Date: April 20, 2023 LOS:  LOS: 3 days    Primary Psychiatric Diagnoses  Adjustment disorder with predominant anxiety 2.  MDD/GAD   Assessment  Julie Rios is a 22 y.o. female admitted medically for 04/17/2023  9:45 AM for induction of childbirth. She carries the psychiatric diagnoses of MDD and GAD and has a past medical history of obesity and pregnancy complications (Rh negative, pruiritis). Psychiatry was consulted for PPD#1 from SVD with history of MDD/GAD, hx of SI, hx of auditory hallucination on SSR, seizure like activity  by Dr. Judd Lien.    Her current presentation of acute worsening of anxiety symptoms is most consistent with adjustment disorder. She denied any trauma from childbirth (adequate pain management, "beautiful experience"). She denied any sustained change in her mood - too early for PPD and mood quite reactive. She meets criteria for MDD and GAD based on historic information.  Current outpatient psychotropic medications include nothing and historically she has had a fair response to these medications at low doses (has hallucinated at higher doses). On initial examination, patient was alert, oriented, and able to participate in conversations about her care. Decided on trazodone as short-term sleep aid (discussed extensively need for partner present re: SIDS, best taken as PRN, etc) - pt reports dissociation with overnight doses of midazolam. Had good response to PRN hydroxyzine. Please see plan below for detailed recommendations.   Diagnoses:  Active Hospital problems: Principal Problem:   GDM, class A1 Active Problems:   Generalized anxiety disorder   Encounter for supervision of normal first pregnancy   Rh negative state in antepartum period   Transaminitis   Pruritus   SVD (spontaneous vaginal delivery)     Plan   ## Psychiatric Medication Recommendations:  -- Start 25-50 trazadone  -- Start hydroxyzine   -- Share decision making with patient to start antidepressant per Dr. Josephina Shih outpatient  - discussed r/b of prozac vs sertraline (known efficacy vs less activating)  ## Medical Decision Making Capacity:  Not formally assessed  ## Further Work-up:  -- None currently - discharging. Put important f/u resources and mothertobaby (med side effects) in AVS.    -- remote EKG with qtc in 550s -- Pertinent labwork reviewed earlier this admission includes: TSH wnl 2 weeks ago  ## Disposition:  -- There are no psychiatric contraindications to discharge at this time -- contacted Dr. Josephina Shih - can move up 2-3 days and on waitlist for sooner -- also on therapy waitlist  ## Behavioral / Environmental:  --   No specific recommendations at this time.     ## Safety and Observation Level:  - Based on my clinical evaluation, I estimate the patient to be at low risk of self harm in the current setting - At this time, we recommend observation by partner who was present during interview and understand significant concerns to seek help. Also recommend and will setting up close follow up with Dr. Josephina Shih including walk in appointments. This decision is based on my review of the chart including patient's history and current presentation, interview of the patient, mental status examination, and consideration of suicide risk including evaluating suicidal ideation, plan, intent, suicidal or self-harm behaviors, risk factors, and protective factors. This judgment is based on our ability to directly address suicide risk, implement suicide prevention strategies and develop a safety plan while the patient is in the clinical setting. Please contact our team if there is a concern that risk level  has changed.  Suicide risk assessment  Patient has following modifiable risk factors for suicide: under treated depression  and lack of access to outpatient mental health resources, which we are addressing by addressing  sleep, psychoeducation, and return precautions.   Patient has following non-modifiable or demographic risk factors for suicide: history of self harm behavior  Patient has the following protective factors against suicide: Supportive family, Supportive friends, and Minor children in the home, faith things will get better   Thank you for this consult request. Recommendations have been communicated to the primary team.  We will sign off at this time.   Julie Rios A Julie Rios  Psychiatric and Social History   Relevant Aspects of Hospital Course:  Admitted on 04/17/2023 for delivery of infant. They have had worsening of anxiety since delivery - received several PRNs while here .   Patient Report:  Pt seen by myself, and note scribed by Dr. Gilman Buttner. I have edited/added as appropriate. Pt's boyfriend and child are at bedside. Baby name is [name]. Pt is tearful during interview. Alert and oriented. Memory pretty poor - partner supplied much information (spectrum of normal).   Initially set frame for interview - safety evaluation, psycho education on PPD/red flags, and shared decision making around medication.   When asked what her concerns were, she says that she feels like she is not providing for her family. She feels that her boyfriend works much more than him. Downplays her contributions (just grew a Development worker, international aid, manages household, etc). She is upset that there are things around the house that she was unable to do. Reports that pregnancy was a very difficult time for her including: severe pruritus, labile moods, generalized swelling, and nausea. Baby planned/wanted.   Reported that she was unable to sleep last night. Discussed that this has been a issues for some time but worsened now. Discussed one strategy for sleep is block sleeping in which one of the partners is available for the baby by the other partner is getting uninterrupted sleep.   Regarding hallucinations, reports that she has had auditory  hallucinations in the past while taking wellbutrin. Reports that she is not experiencing any hallucinations at this time. Educated pt on on the spectrum of normal with hallucinations including sleep deprivations (ie hypnogogic) but that this would need consistent outpatient follow up with Dr. Josephina Shih and emergency evaluation if becomes frequent/present during day.   Discussed the importance of using skin to skin contact, breastfeeding (as much as able), limiting visitors as needed, and uninterrupted sleep. Psychoeducated on how in post partum period there are significant hormonal changes. Pt's mood is reactive, she does feel bonded to baby, and gets rush of oxytocin when she holds her. Affect reactive; from tearful to anxious to   Discussed what PPP would look like and that it is an emergency and pt and partner voiced understanding - familiar with what mania looks like, discussed confusion/psychosis. Discussed antidepressants including prozac/zoloft role in PPD. Discussed higher risk for bipolarity emerging in post partum period and importance of sleep protection.   Disccused that hydroxyine could affect breast milk production but is not harmful to her or baby. Discussed role of trazadone for helping with sleep if doing sleep schedules with partner, as she would not want to have to be available to baby during this sleep time (ie don't use for napping), and discussed role of this med as PRN.   Briefly discussed contraceptions. Briefly discussed that easier to place IUD postpartum (main concern is systemic hormones)  Pt denies SI, thoughts of harming baby, Denies Pt reports that she will try to breastfeed. No HI/AH/VH  Psych ROS:  Depression: tearfulness, worthlessness, insomnia, lack of motivation, labile mood with significant periods of depressed mood. Historically had mood swings prior to menstruations.  Anxiety:  endorses anxiety related to needs of baby such as feeding, not being good enough, etc.  Anxiety significant and affecting her functioning. Also anxious about CPS getting involved due to past cannabis use. No real panic sx.  Mania (lifetime and current): not endorsed. No decreased need for sleep (napping as much as she can, exhausted).  Psychosis: (lifetime and current): as above  Collateral information:  FOB in room w/ pt consent, provided some information below.   Psychiatric History:  Information collected from pt, partner  Prev Dx/Sx: MDD, GAD Current Psych Provider: Dr. Josephina Shih Home Meds (current): none currently Previous Med Trials: wellbutrin (experienced auditory hallucinations), prozac - seems like effective at lower doses Therapy: no  Prior ECT: no Prior Psych Hospitalization: unclear  Prior Self Harm: denies in last three years. Self harm by cutting in past.  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 Knives  Substance History Tobacco use: vapes and aware of importance to not vape near child.  Alcohol use: none since positive pregnancy test Drug use: stopped cannabis use recently; cut back significantly through pregnancy. Current not using and has no cravings.   Exam Findings   Psychiatric Specialty Exam:  Presentation  General Appearance: Appropriate for Environment; Casual  Eye Contact:Good  Speech:Clear and Coherent; Normal Rate  Speech Volume:Normal  Handedness:No data recorded  Mood and Affect  Mood:-- (Anxious and sad)  Affect:Appropriate; Congruent; Full Range   Thought Process  Thought Processes:Coherent; Goal Directed; Linear  Descriptions of Associations:Intact  Orientation:Full (Time, Place and Person)  Thought Content:-- (devoid of  deluisons/paranoia)  Hallucinations:Hallucinations: None  Ideas of Reference:None  Suicidal Thoughts:Suicidal Thoughts: No  Homicidal Thoughts:Homicidal Thoughts: No   Sensorium  Memory:Immediate Good; Recent Good; Remote Good  Judgment:Good  Insight:Good   Executive Functions  Concentration:Good  Attention Span:Good  Recall:Fair  Fund of Knowledge:Good  Language:Good   Psychomotor Activity  Psychomotor Activity:Psychomotor Activity: Normal   Assets  Assets:Communication Skills; Desire for Improvement; Financial Resources/Insurance; Housing; Intimacy; Leisure Time; Social Support   Sleep  Sleep:Sleep: Poor    Physical Exam: Vital signs:  Temp:  [97.9 F (36.6 C)-98 F (36.7 C)] 98 F (36.7 C) (12/11 0506) Pulse Rate:  [87-98] 98 (12/11 0506) Resp:  [18] 18 (12/11 0506) BP: (109-112)/(69-87) 112/87 (12/11 0506) SpO2:  [96 %-99 %] 99 % (12/11 0506)  Physical Exam Vitals and nursing note reviewed.  Pulmonary:     Effort: Pulmonary effort is normal.  Neurological:     General: No focal deficit present.     Mental Status: She is alert.    Blood pressure 112/87, pulse 98, temperature 98 F (36.7 C), temperature source Oral, resp. rate 18, height 5\' 4"  (1.626 m), weight 74 kg, last menstrual period 07/24/2022, SpO2 99%, unknown if currently breastfeeding. Body mass index is 28 kg/m.   Other History   These have been pulled in through the EMR, reviewed, and updated if appropriate.   Family History:  The patient's family  history includes Cirrhosis in her mother; Diabetes in her father and mother.  Medical History: Past Medical History:  Diagnosis Date   Anxiety    Depression    MDD (major depressive disorder), single episode, moderate (HCC)     Surgical History: Past Surgical History:  Procedure Laterality Date   NO PAST SURGERIES      Medications:   Current Facility-Administered Medications:    acetaminophen (TYLENOL) tablet 650 mg,  650 mg, Oral, Q4H PRN, Gwenlyn Perking, MD   benzocaine-Menthol (DERMOPLAST) 20-0.5 % topical spray 1 Application, 1 Application, Topical, PRN, Gwenlyn Perking, MD, 1 Application at 04/20/23 1445   coconut oil, 1 Application, Topical, PRN, Gwenlyn Perking, MD   witch hazel-glycerin (TUCKS) pad 1 Application, 1 Application, Topical, PRN **AND** dibucaine (NUPERCAINAL) 1 % rectal ointment 1 Application, 1 Application, Rectal, PRN, Gwenlyn Perking, MD   diphenhydrAMINE (BENADRYL) capsule 25 mg, 25 mg, Oral, Q6H PRN, Gwenlyn Perking, MD   famotidine (PEPCID) tablet 20 mg, 20 mg, Oral, BID, Gwenlyn Perking, MD, 20 mg at 04/20/23 7628   hydrOXYzine (ATARAX) tablet 25 mg, 25 mg, Oral, TID PRN, Hessie Dibble, MD, 25 mg at 04/20/23 0954   ibuprofen (ADVIL) tablet 600 mg, 600 mg, Oral, Q6H, Gwenlyn Perking, MD, 600 mg at 04/20/23 1157   measles, mumps & rubella vaccine (MMR) injection 0.5 mL, 0.5 mL, Subcutaneous, Once, [place], Hollie Salk, MD   ondansetron [facility]) tablet 4 mg, 4 mg, Oral, Q4H PRN **OR** ondansetron (ZOFRAN) injection 4 mg, 4 mg, Intravenous, Q4H PRN, Gwenlyn Perking, MD   prenatal multivitamin tablet 1 tablet, 1 tablet, Oral, Q1200, Gwenlyn Perking, MD, 1 tablet at 04/20/23 1157   senna-docusate (Senokot-S) tablet 2 tablet, 2 tablet, Oral, Q24H, Gwenlyn Perking, MD, 2 tablet at 04/20/23 0954   simethicone (MYLICON) chewable tablet 80 mg, 80 mg, Oral, PRN, Gwenlyn Perking, MD   sodium chloride flush (NS) 0.9 % injection 3 mL, 3 mL, Intravenous, Q12H, 3 mL at 04/19/23 0936 **AND** sodium chloride flush (NS) 0.9 % injection 3 mL, 3 mL, Intravenous, PRN **AND** sodium chloride flush (NS) 0.9 % injection 10 mL, 10 mL, Intravenous, Q12H, Gwenlyn Perking, MD   traZODone (DESYREL) tablet 25 mg, 25 mg, Oral, QHS PRN, Reyan Helle A  Current Outpatient Medications:    acetaminophen (TYLENOL) 500 MG tablet, Take 2 tablets (1,000 mg total) by mouth every 8 (eight) hours as needed for  moderate pain (pain score 4-6)., Disp: 30 tablet, Rfl: 2   hydrOXYzine (ATARAX) 25 MG tablet, Take 1 tablet (25 mg total) by mouth 3 (three) times daily as needed for anxiety., Disp: 90 tablet, Rfl: 2   ibuprofen (ADVIL) 800 MG tablet, Take 1 tablet (800 mg total) by mouth every 8 (eight) hours as needed., Disp: 30 tablet, Rfl: 2   senna-docusate (SENOKOT-S) 8.6-50 MG tablet, Take 2 tablets by mouth at bedtime as needed for mild constipation., Disp: 60 tablet, Rfl: 1   traZODone (DESYREL) 50 MG tablet, Take 0.5 tablets (25 mg total) by mouth at bedtime as needed for sleep (THis is a 25 mg tablet - cut in half the first time you take it, but OK to take a full tablet if needed.). Works best as needed., Disp: 15 tablet, Rfl: 0  Allergies: No Known Allergies

## 2023-04-20 NOTE — Lactation Note (Signed)
This note was copied from a baby's chart. Lactation Consultation Note  Patient Name: Julie Rios [id number] Date: 04/20/2023 Age:22 hours Reason for consult: Primapara;1st time breastfeeding;Early term 37-38.6wks;Infant weight loss (per MD mom declined LC prior to D/C . LC asked the nurse caring for mom to review engorgement prevention and tx)   Maternal Data    Feeding Mother's Current Feeding Choice: Breast Milk and Formula Nipple Type: Nfant Standard Flow (white)   Discharge    Consult Status Consult Status: Complete Date: 04/20/23    Kathrin Greathouse 04/20/2023, 1:52 PM

## 2023-04-23 ENCOUNTER — Inpatient Hospital Stay (HOSPITAL_COMMUNITY): Payer: MEDICAID

## 2023-04-26 ENCOUNTER — Encounter: Payer: MEDICAID | Admitting: Obstetrics & Gynecology

## 2023-04-26 ENCOUNTER — Telehealth (HOSPITAL_COMMUNITY): Payer: Self-pay

## 2023-04-26 DIAGNOSIS — Z1331 Encounter for screening for depression: Secondary | ICD-10-CM

## 2023-04-26 NOTE — Telephone Encounter (Signed)
04/26/2023 1301  Name: Julie Rios MRN: [id number] DOB: February 25, 2001  Reason for Call:  Transition of Care Hospital Discharge Call  Contact Status: Patient Contact Status: Complete  Language assistant needed:          Follow-Up Questions: Do You Have Any Concerns About Your Health As You Heal From Delivery?: No Do You Have Any Concerns About Your Infants Health?: No  Edinburgh Postnatal Depression Scale:  In the Past 7 Days: I have been able to laugh and see the funny side of things.: As much as I always could I have looked forward with enjoyment to things.: Rather less than I used to I have blamed myself unnecessarily when things went wrong.: Yes, some of the time I have been anxious or worried for no good reason.: Yes, very often I have felt scared or panicky for no good reason.: Yes, sometimes Things have been getting on top of me.: Yes, sometimes I haven't been coping as well as usual I have been so unhappy that I have had difficulty sleeping.: Not at all I have felt sad or miserable.: Yes, most of the time I have been so unhappy that I have been crying.: Yes, most of the time The thought of harming myself has occurred to me.: Never Inocente Salles Postnatal Depression Scale Total: (!) 16  RN placed referral for integrated behavioral health. Patient consents to a virtual counseling appointment. Patient states that she has a therapist at [facility] but that she has not been able to get an appointment due to no appointment availability until January.   RN told patient about Maternal Mental Health Resources ([facility], National Maternal Mental Health Hotline, Postpartum Support International, National Suicide and Crisis Lifeline). Also told patient about [facility] support group offerings. Will e-mail these resources to patient as well.  PHQ2-9 Depression Scale:     Discharge Follow-up: Edinburgh score requires follow up?: Yes Provider notified of  Edinburgh score?: Yes Have you already been referred for a counseling appointment?: No Patient was advised of the following resources:: Support Group  Post-discharge interventions: Reviewed Newborn Safe Sleep Practices Maternal Mental Health Resources provided Placed [facility] referral in patient's chart  Signature  Signe Colt

## 2023-04-27 ENCOUNTER — Encounter: Payer: MEDICAID | Admitting: Family Medicine

## 2023-04-28 ENCOUNTER — Encounter: Payer: Self-pay | Admitting: Nurse Practitioner

## 2023-04-28 ENCOUNTER — Ambulatory Visit: Payer: MEDICAID | Admitting: Nurse Practitioner

## 2023-04-28 ENCOUNTER — Other Ambulatory Visit: Payer: MEDICAID

## 2023-04-28 VITALS — BP 110/72 | HR 87 | Ht 64.0 in | Wt 149.2 lb

## 2023-04-28 DIAGNOSIS — R1011 Right upper quadrant pain: Secondary | ICD-10-CM

## 2023-04-28 DIAGNOSIS — K8050 Calculus of bile duct without cholangitis or cholecystitis without obstruction: Secondary | ICD-10-CM | POA: Diagnosis not present

## 2023-04-28 DIAGNOSIS — K8020 Calculus of gallbladder without cholecystitis without obstruction: Secondary | ICD-10-CM

## 2023-04-28 DIAGNOSIS — D649 Anemia, unspecified: Secondary | ICD-10-CM

## 2023-04-28 DIAGNOSIS — K838 Other specified diseases of biliary tract: Secondary | ICD-10-CM | POA: Diagnosis not present

## 2023-04-28 LAB — HEPATIC FUNCTION PANEL
ALT: 12 U/L (ref 0–35)
AST: 15 U/L (ref 0–37)
Albumin: 3.7 g/dL (ref 3.5–5.2)
Alkaline Phosphatase: 73 U/L (ref 39–117)
Bilirubin, Direct: 0.1 mg/dL (ref 0.0–0.3)
Total Bilirubin: 0.4 mg/dL (ref 0.2–1.2)
Total Protein: 6.4 g/dL (ref 6.0–8.3)

## 2023-04-28 NOTE — Patient Instructions (Addendum)
_______________________________________________________  If your blood pressure at your visit was 140/90 or greater, please contact your primary care physician to follow up on this.  _______________________________________________________  If you are age 22 or older, your body mass index should be between 23-30. Your Body mass index is 25.62 kg/m. If this is out of the aforementioned range listed, please consider follow up with your Primary Care Provider.  If you are age 40 or younger, your body mass index should be between 19-25. Your Body mass index is 25.62 kg/m. If this is out of the aformentioned range listed, please consider follow up with your Primary Care Provider.   ________________________________________________________  The [facility] providers would like to encourage you to use [facility] to communicate with providers for non-urgent requests or questions.  Due to long hold times on the telephone, sending your provider a message by [facility] may be a faster and more efficient way to get a response.  Please allow 48 business hours for a response.  Please remember that this is for non-urgent requests.  _______________________________________________________  Your provider has requested that you go to the basement level for lab work before leaving today. Press "B" on the elevator. The lab is located at the first door on the left as you exit the elevator.  It was a pleasure to see you today!  Thank you for trusting me with your gastrointestinal care!

## 2023-04-28 NOTE — Progress Notes (Signed)
Brief Narrative 22 y.o. yo female with a past medical history not limited to GAD, severe depression with suicidal ideation, pyelonephritis, gestational diabetes, gestational GERD and cholelithiasis . She is new to the practice, referred by GYN   on 12/5 for "cholestasis during 3rd trimester of pregnancy".  Patient delivered her baby on 12/9  ASSESSMENT     Recent transient elevation of liver chemistries ( predominantly hepatocellular pattern) in setting of US findings of cholelithiasis, RUQ discomfort and a prominent CBD of 7 mm Picture is unclear, ? Passed gallstones. Acute cholecystitis unlikely. LFTs have normalized on 12/8 labs but she continues to have intermittent RUQ discomfort.   Esmeralda anemia, likely related to pregnancy / recent childbirth.    See PMH for any additional medical history   PLAN    -- Repeat hepatic function panel today and make sure LFTs have remained normal.  --Will contact her with test results. If continues to have RUQ discomfort then consider HIDA and / or surgical evaluation   HPI   Chief complaint :  Right sided abdominal pain , gallstones.   Lamont delivered her baby on 12/9. She tells me she is here because bile number in blood was high .   On 11/25 , at [redacted] weeks gestation,  Teja developed vomiting, abdominal pain and back pain .  Went to [facility] on 11/25 and was found to have elevated liver enzymes and an elevated bile acid level of 24.  Acute viral hepatitis panel was negative  RUQ [place] >> cholelithiasis without signs of acute cholecystitis and a prominent CBD of 7 mm.  LFTs slightly worse on recheck a couple of days later  ( 5 x ULN) with a mild elevation of alk phos.  LFTs improved on repeat labs 12/4 and then normalized on 12/8.   Jaryah continues to have episodes of RUQ discomfort,  especially after consumption of fatty foods but pain can also occur independent of eating such as in the middle of the night.  No associated nausea ( since delivery). She  was having reflux symptoms during pregnancy, treated with Pepcid. Off Pepcid now with any recurrence of GERD symptoms.   11/25 >>  AST 127 / ALT 113  11/27 >> AST 215 /  ALT 184, alk phos 141 12/4  >>  AST 48  / ALT 62, alk phos 123 12/8  >>  LFTs normal  Acute viral hep panel negative.   RUQ [place] on 12/4 IMPRESSION: 1. Cholelithiasis without secondary signs of acute cholecystitis. 2. Common bile duct is prominent measuring 7 mm. Recommend correlation with LFTs. If there is concern for biliary obstruction, recommend MRI/MRCP.   Labs      Latest Ref Rng & Units 04/19/2023    4:41 AM 04/17/2023   10:30 AM 04/04/2023    3:17 PM  CBC  WBC 4.0 - 10.5 K/uL 20.8  11.1  12.4   Hemoglobin 12.0 - 15.0 g/dL 9.3  03.4  74.2   Hematocrit 36.0 - 46.0 % 28.4  32.2  35.8   Platelets 150 - 400 K/uL 292  362  391     Lab Results  Component Value Date   LIPASE 22 04/04/2023      Latest Ref Rng & Units 04/17/2023   10:30 AM 04/13/2023    3:06 PM 04/06/2023    2:27 PM  CMP  Glucose 70 - 99 mg/dL 595  66  72   BUN 6 - 20 mg/dL <5  5  4  Creatinine 0.44 - 1.00 mg/dL 4.33  2.95  1.88   Sodium 135 - 145 mmol/L 136  136  135   Potassium 3.5 - 5.1 mmol/L 3.3  3.8  3.8   Chloride 98 - 111 mmol/L 106  102  98   CO2 22 - 32 mmol/L 19  20  19    Calcium 8.9 - 10.3 mg/dL 8.9  8.5  9.3   Total Protein 6.5 - 8.1 g/dL 6.1  5.9  6.8   Total Bilirubin <1.2 mg/dL 0.5  0.3  0.6   Alkaline Phos 38 - 126 U/L 98  123  141   AST 15 - 41 U/L 29  48  215   ALT 0 - 44 U/L 34  62  184     Past Medical History:  Diagnosis Date   Anxiety    Depression    MDD (major depressive disorder), single episode, moderate (HCC)    Past Surgical History:  Procedure Laterality Date   NO PAST SURGERIES     Family History  Problem Relation Age of Onset   Diabetes Mother    Cirrhosis Mother    Diabetes Father    Colon cancer Neg Hx    Esophageal cancer Neg Hx    Social History   Tobacco Use   Smoking status:  Never    Passive exposure: Yes   Smokeless tobacco: Never  Vaping Use   Vaping status: Some Days   Substances: Nicotine  Substance Use Topics   Alcohol use: No    Comment: occ   Drug use: Yes    Types: Other-see comments, Marijuana    Comment: reports use of THC-A approx. nightly   Current Outpatient Medications  Medication Sig Dispense Refill   acetaminophen (TYLENOL) 500 MG tablet Take 2 tablets (1,000 mg total) by mouth every 8 (eight) hours as needed for moderate pain (pain score 4-6). 30 tablet 2   hydrOXYzine (ATARAX) 25 MG tablet Take 1 tablet (25 mg total) by mouth 3 (three) times daily as needed for anxiety. 90 tablet 2   ibuprofen (ADVIL) 800 MG tablet Take 1 tablet (800 mg total) by mouth every 8 (eight) hours as needed. 30 tablet 2   senna-docusate (SENOKOT-S) 8.6-50 MG tablet Take 2 tablets by mouth at bedtime as needed for mild constipation. 60 tablet 1   traZODone (DESYREL) 50 MG tablet Take 0.5 tablets (25 mg total) by mouth at bedtime as needed for sleep (THis is a 25 mg tablet - cut in half the first time you take it, but OK to take a full tablet if needed.). Works best as needed. 15 tablet 0   No current facility-administered medications for this visit.   No Known Allergies   Review of Systems: Positive for anxiety, back pain, confusion, depression, fatigue, headaches, itching, shortness of breath, excessive thirst.  All other systems reviewed and negative except where noted in HPI.   Wt Readings from Last 3 Encounters:  04/28/23 149 lb 4 oz (67.7 kg)  04/17/23 163 lb 1.6 oz (74 kg)  04/13/23 163 lb 9.6 oz (74.2 kg)    Physical Exam:  BP 110/72   Pulse 87   Ht 5\' 4"  (1.626 m)   Wt 149 lb 4 oz (67.7 kg)   LMP 07/24/2022   BMI 25.62 kg/m  Constitutional:  Pleasant, generally well appearing female in no acute distress. Psychiatric:  Normal mood and affect. Behavior is normal. EENT: Pupils normal.  Conjunctivae are normal. No scleral icterus.  Neck supple.   Cardiovascular: Normal rate, regular rhythm.  Pulmonary/chest: Effort normal and breath sounds normal. No wheezing, rales or rhonchi. Abdominal: Soft, nondistended, nontender. Bowel sounds active throughout. There are no masses palpable. No hepatomegaly. Neurological: Alert and oriented to person place and time.  Willette Cluster, NP  04/28/2023, 2:48 PM  Cc:  Referring Provider Pelion Bing, MD

## 2023-04-30 ENCOUNTER — Emergency Department: Payer: MEDICAID

## 2023-04-30 ENCOUNTER — Encounter: Payer: Self-pay | Admitting: Pharmacy Technician

## 2023-04-30 ENCOUNTER — Other Ambulatory Visit: Payer: Self-pay

## 2023-04-30 ENCOUNTER — Observation Stay
Admission: EM | Admit: 2023-04-30 | Discharge: 2023-05-02 | Disposition: A | Discharge status: Home / Self Care | Payer: MEDICAID | Attending: Surgery | Admitting: Surgery

## 2023-04-30 DIAGNOSIS — K810 Acute cholecystitis: Principal | ICD-10-CM | POA: Insufficient documentation

## 2023-04-30 DIAGNOSIS — R1011 Right upper quadrant pain: Secondary | ICD-10-CM | POA: Diagnosis present

## 2023-04-30 DIAGNOSIS — K819 Cholecystitis, unspecified: Secondary | ICD-10-CM | POA: Diagnosis present

## 2023-04-30 LAB — URINALYSIS, ROUTINE W REFLEX MICROSCOPIC
Bilirubin Urine: NEGATIVE
Glucose, UA: NEGATIVE mg/dL
Ketones, ur: NEGATIVE mg/dL
Nitrite: NEGATIVE
Protein, ur: NEGATIVE mg/dL
RBC / HPF: >50 RBC/hpf (ref 0–5)
Specific Gravity, Urine: 1.011 (ref 1.005–1.030)
WBC, UA: >50 WBC/hpf (ref 0–5)
pH: 7 (ref 5.0–8.0)

## 2023-04-30 LAB — COMPREHENSIVE METABOLIC PANEL
ALT: 32 U/L (ref 0–44)
AST: 85 U/L — ABNORMAL HIGH (ref 15–41)
Albumin: 3.8 g/dL (ref 3.5–5.0)
Alkaline Phosphatase: 103 U/L (ref 38–126)
Anion gap: 9 (ref 5–15)
BUN: 7 mg/dL (ref 6–20)
CO2: 22 mmol/L (ref 22–32)
Calcium: 8.9 mg/dL (ref 8.9–10.3)
Chloride: 106 mmol/L (ref 98–111)
Creatinine, Ser: 0.52 mg/dL (ref 0.44–1.00)
GFR, Estimated: >60 mL/min (ref >60)
Glucose, Bld: 91 mg/dL (ref 70–99)
Potassium: 4.1 mmol/L (ref 3.5–5.1)
Sodium: 137 mmol/L (ref 135–145)
Total Bilirubin: 1 mg/dL (ref <1.2)
Total Protein: 7.6 g/dL (ref 6.5–8.1)

## 2023-04-30 LAB — POC URINE PREG, ED: Preg Test, Ur: POSITIVE — AB

## 2023-04-30 LAB — LIPASE, BLOOD: Lipase: 30 U/L (ref 11–51)

## 2023-04-30 LAB — CBC
HCT: 40.1 % (ref 36.0–46.0)
Hemoglobin: 12.8 g/dL (ref 12.0–15.0)
MCH: 26.5 pg (ref 26.0–34.0)
MCHC: 31.9 g/dL (ref 30.0–36.0)
MCV: 83 fL (ref 80.0–100.0)
Platelets: 430 10*3/uL — ABNORMAL HIGH (ref 150–400)
RBC: 4.83 MIL/uL (ref 3.87–5.11)
RDW: 15.5 % (ref 11.5–15.5)
WBC: 13.5 10*3/uL — ABNORMAL HIGH (ref 4.0–10.5)
nRBC: 0 % (ref 0.0–0.2)

## 2023-04-30 LAB — HCG, QUANTITATIVE, PREGNANCY: hCG, Beta Chain, Quant, S: 19 m[IU]/mL — ABNORMAL HIGH (ref <5)

## 2023-04-30 MED ORDER — ONDANSETRON 4 MG PO TBDP: 4.0000 mg | ORAL_TABLET | Freq: Four times a day (QID) | ORAL | Status: DC | PRN

## 2023-04-30 MED ORDER — ACETAMINOPHEN 500 MG PO TABS
1000.0000 mg | ORAL_TABLET | Freq: Four times a day (QID) | ORAL | Status: DC
  Administered 2023-04-30 – 2023-05-02 (×7): 1000 mg via ORAL
  Filled 2023-04-30 (×7): qty 2

## 2023-04-30 MED ORDER — PROCHLORPERAZINE EDISYLATE 10 MG/2ML IJ SOLN: 5.0000 mg | Freq: Four times a day (QID) | INTRAMUSCULAR | Status: DC | PRN

## 2023-04-30 MED ORDER — SODIUM CHLORIDE 0.9 % IV SOLN: INTRAVENOUS | Status: AC

## 2023-04-30 MED ORDER — MORPHINE SULFATE (PF) 2 MG/ML IV SOLN
2.0000 mg | INTRAVENOUS | Status: DC | PRN
  Administered 2023-05-01 (×2): 2 mg via INTRAVENOUS
  Filled 2023-04-30 (×3): qty 1

## 2023-04-30 MED ORDER — PANTOPRAZOLE SODIUM 40 MG IV SOLR
40.0000 mg | Freq: Every day | INTRAVENOUS | Status: DC
  Administered 2023-04-30 – 2023-05-01 (×2): 40 mg via INTRAVENOUS
  Filled 2023-04-30 (×2): qty 10

## 2023-04-30 MED ORDER — ZOLPIDEM TARTRATE 5 MG PO TABS: 5.0000 mg | ORAL_TABLET | Freq: Every evening | ORAL | Status: DC | PRN

## 2023-04-30 MED ORDER — ONDANSETRON HCL 4 MG/2ML IJ SOLN: 4.0000 mg | Freq: Four times a day (QID) | INTRAMUSCULAR | Status: DC | PRN

## 2023-04-30 MED ORDER — DIPHENHYDRAMINE HCL 50 MG/ML IJ SOLN: 12.5000 mg | Freq: Four times a day (QID) | INTRAMUSCULAR | Status: DC | PRN

## 2023-04-30 MED ORDER — OXYCODONE HCL 5 MG PO TABS
5.0000 mg | ORAL_TABLET | ORAL | Status: DC | PRN
  Administered 2023-05-01: 5 mg via ORAL
  Administered 2023-05-01: 10 mg via ORAL
  Administered 2023-05-01: 5 mg via ORAL
  Administered 2023-05-01 – 2023-05-02 (×3): 10 mg via ORAL
  Filled 2023-04-30: qty 2
  Filled 2023-04-30 (×3): qty 1
  Filled 2023-04-30 (×2): qty 2
  Filled 2023-04-30: qty 1

## 2023-04-30 MED ORDER — PROCHLORPERAZINE MALEATE 10 MG PO TABS: 10.0000 mg | ORAL_TABLET | Freq: Four times a day (QID) | ORAL | Status: DC | PRN

## 2023-04-30 MED ORDER — ACETAMINOPHEN 325 MG PO TABS
650.0000 mg | ORAL_TABLET | Freq: Once | ORAL | Status: AC
  Administered 2023-04-30: 650 mg via ORAL
  Filled 2023-04-30: qty 2

## 2023-04-30 MED ORDER — SODIUM CHLORIDE 0.9 % IV SOLN
2.0000 g | INTRAVENOUS | Status: DC
  Administered 2023-04-30 – 2023-05-01 (×2): 2 g via INTRAVENOUS
  Filled 2023-04-30 (×3): qty 20

## 2023-04-30 MED ORDER — ENOXAPARIN SODIUM 40 MG/0.4ML IJ SOSY
40.0000 mg | PREFILLED_SYRINGE | INTRAMUSCULAR | Status: DC
  Administered 2023-04-30: 40 mg via SUBCUTANEOUS
  Filled 2023-04-30: qty 0.4

## 2023-04-30 MED ORDER — KETOROLAC TROMETHAMINE 30 MG/ML IJ SOLN
30.0000 mg | Freq: Four times a day (QID) | INTRAMUSCULAR | Status: DC | PRN
  Administered 2023-05-01: 30 mg via INTRAVENOUS
  Filled 2023-04-30: qty 1

## 2023-04-30 MED ORDER — IOHEXOL 300 MG/ML  SOLN
80.0000 mL | Freq: Once | INTRAMUSCULAR | Status: AC | PRN
  Administered 2023-04-30: 80 mL via INTRAVENOUS

## 2023-04-30 MED ORDER — DIPHENHYDRAMINE HCL 12.5 MG/5ML PO ELIX
12.5000 mg | ORAL_SOLUTION | Freq: Four times a day (QID) | ORAL | Status: DC | PRN
  Administered 2023-05-01 – 2023-05-02 (×3): 12.5 mg via ORAL
  Filled 2023-04-30 (×3): qty 5

## 2023-04-30 NOTE — ED Provider Notes (Signed)
[facility] Provider Note    Event Date/Time   First MD Initiated Contact with Patient 04/30/23 1309     (approximate)   History   Abdominal Pain   HPI  Julie Rios is a 22 y.o. female with PMH of MDD with suicidal ideation, anxiety, gestational diabetes and cholelithiasis who recently delivered vaginally on 12/9.  She presents today for evaluation of ongoing right upper quadrant pain that radiates to the left side.  Patient was seen and evaluated for RUQ pain by GI on 12/19.  Patient had a history of elevated LFTs which normalized after her delivery but she has continued to have RUQ pain.  She states that last night the pain intensified to a new level.  She has had some nausea but no vomiting she endorses constipation no diarrhea.  No fevers at home.  She denies urinary symptoms.     Physical Exam   Triage Vital Signs: ED Triage Vitals  Encounter Vitals Group     BP 04/30/23 1223 (!) 122/97     Systolic BP Percentile --      Diastolic BP Percentile --      Pulse Rate 04/30/23 1223 100     Resp 04/30/23 1223 20     Temp 04/30/23 1223 (!) 97.4 F (36.3 C)     Temp Source 04/30/23 1223 Oral     SpO2 04/30/23 1223 100 %     Weight --      Height --      Head Circumference --      Peak Flow --      Pain Score 04/30/23 1226 10     Pain Loc --      Pain Education --      Exclude from Growth Chart --     Most recent vital signs: Vitals:   04/30/23 1600 04/30/23 1630  BP: 110/75 114/80  Pulse: 62 74  Resp:    Temp:    SpO2: 100% 99%   General: Awake, obvious distress, holding her right side of the abdomen. CV:  Good peripheral perfusion. RRR. Resp:  Normal effort.  CTAB. Abd:  No distention.  Tender to palpation in the right upper quadrant as well as the rest of the abdomen.  Bowel sounds appropriate. Other:     ED Results / Procedures / Treatments   Labs (all labs ordered are listed, but only abnormal results are displayed) Labs  Reviewed  COMPREHENSIVE METABOLIC PANEL - Abnormal; Notable for the following components:      Result Value   AST 85 (*)    All other components within normal limits  CBC - Abnormal; Notable for the following components:   WBC 13.5 (*)    Platelets 430 (*)    All other components within normal limits  URINALYSIS, ROUTINE W REFLEX MICROSCOPIC - Abnormal; Notable for the following components:   Color, Urine YELLOW (*)    APPearance HAZY (*)    Hgb urine dipstick LARGE (*)    Leukocytes,Ua MODERATE (*)    Bacteria, UA RARE (*)    All other components within normal limits  POC URINE PREG, ED - Abnormal; Notable for the following components:   Preg Test, Ur POSITIVE (*)    All other components within normal limits  LIPASE, BLOOD  CBC  CREATININE, SERUM    RADIOLOGY  RUQ [place] and CT abdomen pelvis obtained, I interpreted the images as well as reviewed the radiologist report.   Ultrasound showed  cholelithiasis without sonographic evidence for acute cholecystitis.  Common bile duct measures 6 mm, not substantially changed from 7 mm previously.  Recommended correlation with liver function tests.   CT shows gallbladder wall thickening with pericholecystic edema suggestive of acute cholecystitis.  There is also mild periportal edema which may be an inflammatory reaction.  No bile duct dilation.  No evidence of bowel obstruction or inflammation.   PROCEDURES:  Critical Care performed: No  Procedures   MEDICATIONS ORDERED IN ED: Medications  enoxaparin (LOVENOX) injection 40 mg (has no administration in time range)  0.9 %  sodium chloride infusion (has no administration in time range)  cefTRIAXone (ROCEPHIN) 2 g in sodium chloride 0.9 % 100 mL IVPB (has no administration in time range)  acetaminophen (TYLENOL) tablet 1,000 mg (has no administration in time range)  ketorolac (TORADOL) 30 MG/ML injection 30 mg (has no administration in time range)  oxyCODONE (Oxy IR/ROXICODONE)  immediate release tablet 5-10 mg (has no administration in time range)  morphine (PF) 2 MG/ML injection 2 mg (has no administration in time range)  diphenhydrAMINE (BENADRYL) 12.5 MG/5ML elixir 12.5 mg (has no administration in time range)    Or  diphenhydrAMINE (BENADRYL) injection 12.5 mg (has no administration in time range)  zolpidem (AMBIEN) tablet 5 mg (has no administration in time range)  ondansetron (ZOFRAN-ODT) disintegrating tablet 4 mg (has no administration in time range)    Or  ondansetron (ZOFRAN) injection 4 mg (has no administration in time range)  prochlorperazine (COMPAZINE) tablet 10 mg (has no administration in time range)    Or  prochlorperazine (COMPAZINE) injection 5-10 mg (has no administration in time range)  pantoprazole (PROTONIX) injection 40 mg (has no administration in time range)  acetaminophen (TYLENOL) tablet 650 mg (650 mg Oral Given 04/30/23 1339)  iohexol (OMNIPAQUE) 300 MG/ML solution 80 mL (80 mLs Intravenous Contrast Given 04/30/23 1653)     IMPRESSION / MDM / ASSESSMENT AND PLAN / ED COURSE  I reviewed the triage vital signs and the nursing notes.                             22 year old female with recent vaginal delivery on 12/9 presents for evaluation of right upper quadrant pain.  Diastolic BP was elevated and patient in obvious distress on exam, vital signs are stable otherwise.  Differential diagnosis includes, but is not limited to, cholelithiasis, acute cholecystitis, biliary colic, HELLP, preeclampsia, pancreatitis.  Patient's presentation is most consistent with acute complicated illness / injury requiring diagnostic workup.  CMP shows elevated AST but is otherwise normal.  CBC shows mildly elevated white count and elevated platelets.  Lipase WNL.  Pregnancy test was positive but I believe this is due to patient's recent vaginal delivery.  Urinalysis shows large hemoglobin, moderate leukocytes greater than 50 RBCs, greater than 50 WBCs,  bacteria, WBCs clumps, mucus and amorphous crystals.  Right upper quadrant ultrasound did not show evidence of cholecystitis but did show cholelithiasis.  Since patient reported generalized abdominal pain I also got a CT abdomen pelvis, Rios to getting the imaging we discussed risks benefits and alternatives due to patient's positive pregnancy test.  I did confirm with her that she has not been sexually active since her delivery.  CT was positive for acute cholecystitis.  I spoke with the on-call general surgeon, Dr. Everlene Farrier, who advised patient be admitted for acute cholecystitis and cholecystectomy tomorrow.  I discussed this with the patient who  is on board with the plan.  She was given Tylenol for pain while in the ED.  She was stable at the time of admission.     FINAL CLINICAL IMPRESSION(S) / ED DIAGNOSES   Final diagnoses:  Acute cholecystitis     Rx / DC Orders   ED Discharge Orders     None        Note:  This document was prepared using Dragon voice recognition software and may include unintentional dictation errors.   Cameron Ali, PA-C 04/30/23 1811    Dionne Bucy, MD 04/30/23 1905

## 2023-04-30 NOTE — ED Triage Notes (Signed)
Pt here with R sided abdominal pain radiating into the L side. States seen recently and dx with gallstones. Pt had an appt with GI on Thursday. States pain has been unbearable since.

## 2023-05-01 ENCOUNTER — Observation Stay: Payer: MEDICAID | Admitting: Anesthesiology

## 2023-05-01 ENCOUNTER — Other Ambulatory Visit: Payer: Self-pay

## 2023-05-01 ENCOUNTER — Encounter
Admission: EM | Disposition: A | Discharge status: Home / Self Care | Payer: Self-pay | Source: Home / Self Care | Attending: Emergency Medicine

## 2023-05-01 DIAGNOSIS — K810 Acute cholecystitis: Secondary | ICD-10-CM | POA: Diagnosis not present

## 2023-05-01 SURGERY — CHOLECYSTECTOMY, ROBOT-ASSISTED, LAPAROSCOPIC
Anesthesia: General | Site: Abdomen

## 2023-05-01 MED ORDER — BUPIVACAINE-EPINEPHRINE (PF) 0.25% -1:200000 IJ SOLN
INTRAMUSCULAR | Status: DC | PRN
  Administered 2023-05-01: 30 mL via PERINEURAL

## 2023-05-01 MED ORDER — ALPRAZOLAM 1 MG PO TABS
0.5000 mg | ORAL_TABLET | Freq: Four times a day (QID) | ORAL | Status: DC | PRN
  Administered 2023-05-02: 0.5 mg via ORAL
  Filled 2023-05-01: qty 1

## 2023-05-01 MED ORDER — FENTANYL CITRATE (PF) 100 MCG/2ML IJ SOLN
INTRAMUSCULAR | Status: DC | PRN
  Administered 2023-05-01 (×2): 50 ug via INTRAVENOUS

## 2023-05-01 MED ORDER — SIMETHICONE 80 MG PO CHEW
80.0000 mg | CHEWABLE_TABLET | Freq: Once | ORAL | Status: AC
Start: 2023-05-01 — End: 2023-05-01
  Administered 2023-05-01: 80 mg via ORAL
  Filled 2023-05-01: qty 1

## 2023-05-01 MED ORDER — OXYCODONE HCL 5 MG/5ML PO SOLN
5.0000 mg | Freq: Once | ORAL | Status: AC | PRN
Start: 2023-05-01 — End: 2023-05-01

## 2023-05-01 MED ORDER — HYDROMORPHONE HCL 1 MG/ML IJ SOLN
1.0000 mg | INTRAMUSCULAR | Status: DC | PRN
  Administered 2023-05-01 (×2): 1 mg via INTRAVENOUS
  Filled 2023-05-01 (×2): qty 1

## 2023-05-01 MED ORDER — KETOROLAC TROMETHAMINE 30 MG/ML IJ SOLN
30.0000 mg | Freq: Four times a day (QID) | INTRAMUSCULAR | Status: DC
  Administered 2023-05-01 – 2023-05-02 (×4): 30 mg via INTRAVENOUS
  Filled 2023-05-01 (×4): qty 1

## 2023-05-01 MED ORDER — ROCURONIUM BROMIDE 100 MG/10ML IV SOLN
INTRAVENOUS | Status: DC | PRN
  Administered 2023-05-01: 40 mg via INTRAVENOUS

## 2023-05-01 MED ORDER — MIDAZOLAM HCL 2 MG/2ML IJ SOLN
INTRAMUSCULAR | Status: AC
  Filled 2023-05-01: qty 2

## 2023-05-01 MED ORDER — DEXAMETHASONE SODIUM PHOSPHATE 10 MG/ML IJ SOLN
INTRAMUSCULAR | Status: DC | PRN
  Administered 2023-05-01: 10 mg via INTRAVENOUS

## 2023-05-01 MED ORDER — KETOROLAC TROMETHAMINE 30 MG/ML IJ SOLN
INTRAMUSCULAR | Status: DC | PRN
  Administered 2023-05-01: 30 mg via INTRAVENOUS

## 2023-05-01 MED ORDER — FENTANYL CITRATE (PF) 100 MCG/2ML IJ SOLN: 25.0000 ug | INTRAMUSCULAR | Status: DC | PRN

## 2023-05-01 MED ORDER — ONDANSETRON HCL 4 MG/2ML IJ SOLN
INTRAMUSCULAR | Status: AC
  Filled 2023-05-01: qty 2

## 2023-05-01 MED ORDER — SUGAMMADEX SODIUM 200 MG/2ML IV SOLN
INTRAVENOUS | Status: DC | PRN
  Administered 2023-05-01: 200 mg via INTRAVENOUS

## 2023-05-01 MED ORDER — FENTANYL CITRATE (PF) 100 MCG/2ML IJ SOLN
INTRAMUSCULAR | Status: AC
  Filled 2023-05-01: qty 2

## 2023-05-01 MED ORDER — LIDOCAINE HCL (PF) 2 % IJ SOLN
INTRAMUSCULAR | Status: AC
  Filled 2023-05-01: qty 5

## 2023-05-01 MED ORDER — OXYCODONE HCL 5 MG PO TABS
ORAL_TABLET | ORAL | Status: AC
  Filled 2023-05-01: qty 1

## 2023-05-01 MED ORDER — DEXAMETHASONE SODIUM PHOSPHATE 10 MG/ML IJ SOLN
INTRAMUSCULAR | Status: AC
  Filled 2023-05-01: qty 1

## 2023-05-01 MED ORDER — HYDROCODONE-ACETAMINOPHEN 5-325 MG PO TABS: 1.0000 | ORAL_TABLET | ORAL | 0 refills | Status: DC | PRN

## 2023-05-01 MED ORDER — 0.9 % SODIUM CHLORIDE (POUR BTL) OPTIME
TOPICAL | Status: DC | PRN
  Administered 2023-05-01: 500 mL

## 2023-05-01 MED ORDER — PROPOFOL 1000 MG/100ML IV EMUL
INTRAVENOUS | Status: AC
  Filled 2023-05-01: qty 100

## 2023-05-01 MED ORDER — BUPIVACAINE LIPOSOME 1.3 % IJ SUSP
INTRAMUSCULAR | Status: DC | PRN
  Administered 2023-05-01: 20 mL

## 2023-05-01 MED ORDER — LIDOCAINE HCL (CARDIAC) PF 100 MG/5ML IV SOSY
PREFILLED_SYRINGE | INTRAVENOUS | Status: DC | PRN
  Administered 2023-05-01: 60 mg via INTRAVENOUS

## 2023-05-01 MED ORDER — MIDAZOLAM HCL 2 MG/2ML IJ SOLN
INTRAMUSCULAR | Status: DC | PRN
  Administered 2023-05-01 (×2): 1 mg via INTRAVENOUS

## 2023-05-01 MED ORDER — ONDANSETRON HCL 4 MG/2ML IJ SOLN
INTRAMUSCULAR | Status: DC | PRN
  Administered 2023-05-01: 4 mg via INTRAVENOUS

## 2023-05-01 MED ORDER — DEXMEDETOMIDINE HCL IN NACL 80 MCG/20ML IV SOLN
INTRAVENOUS | Status: DC | PRN
  Administered 2023-05-01: 12 ug via INTRAVENOUS
  Administered 2023-05-01: 8 ug via INTRAVENOUS

## 2023-05-01 MED ORDER — PROPOFOL 10 MG/ML IV BOLUS
INTRAVENOUS | Status: DC | PRN
  Administered 2023-05-01: 150 ug/kg/min via INTRAVENOUS
  Administered 2023-05-01: 120 mg via INTRAVENOUS

## 2023-05-01 MED ORDER — ROCURONIUM BROMIDE 10 MG/ML (PF) SYRINGE
PREFILLED_SYRINGE | INTRAVENOUS | Status: AC
Start: 2023-05-01 — End: ?
  Filled 2023-05-01: qty 10

## 2023-05-01 MED ORDER — LACTATED RINGERS IV SOLN: INTRAVENOUS | Status: DC | PRN

## 2023-05-01 MED ORDER — ONDANSETRON HCL 4 MG/2ML IJ SOLN: 4.0000 mg | Freq: Once | INTRAMUSCULAR | Status: DC | PRN

## 2023-05-01 MED ORDER — HYDROMORPHONE HCL 1 MG/ML IJ SOLN
INTRAMUSCULAR | Status: DC | PRN
Start: 2023-05-01 — End: 2023-05-01
  Administered 2023-05-01: .5 mg via INTRAVENOUS

## 2023-05-01 MED ORDER — PREGABALIN 100 MG PO CAPS: 100.0000 mg | ORAL_CAPSULE | Freq: Three times a day (TID) | ORAL | Status: DC

## 2023-05-01 MED ORDER — OXYCODONE HCL 5 MG PO TABS
5.0000 mg | ORAL_TABLET | Freq: Once | ORAL | Status: AC | PRN
  Administered 2023-05-01: 5 mg via ORAL

## 2023-05-01 MED ORDER — INDOCYANINE GREEN 25 MG IV SOLR
1.2500 mg | Freq: Once | INTRAVENOUS | Status: AC
  Administered 2023-05-01: 1.25 mg via INTRAVENOUS
  Filled 2023-05-01: qty 10

## 2023-05-01 MED ORDER — KETAMINE HCL 50 MG/5ML IJ SOSY
PREFILLED_SYRINGE | INTRAMUSCULAR | Status: DC | PRN
  Administered 2023-05-01: 30 mg via INTRAVENOUS

## 2023-05-01 MED ORDER — PREGABALIN 50 MG PO CAPS
100.0000 mg | ORAL_CAPSULE | Freq: Three times a day (TID) | ORAL | Status: DC
  Administered 2023-05-01 – 2023-05-02 (×2): 100 mg via ORAL
  Filled 2023-05-01 (×3): qty 2

## 2023-05-01 SURGICAL SUPPLY — 41 items
CANNULA REDUCER 12-8 DVNC XI (CANNULA) ×2 IMPLANT
CAUTERY HOOK MNPLR 1.6 DVNC XI (INSTRUMENTS) ×2 IMPLANT
CLIP LIGATING HEMO O LOK GREEN (MISCELLANEOUS) ×2 IMPLANT
DEFOGGER SCOPE WARMER CLEARIFY (MISCELLANEOUS) IMPLANT
DERMABOND ADVANCED .7 DNX12 (GAUZE/BANDAGES/DRESSINGS) ×2 IMPLANT
DRAPE ARM DVNC X/XI (DISPOSABLE) ×8 IMPLANT
DRAPE COLUMN DVNC XI (DISPOSABLE) ×2 IMPLANT
ELECT REM PT RETURN 9FT ADLT (ELECTROSURGICAL) ×1 IMPLANT
ELECTRODE REM PT RTRN 9FT ADLT (ELECTROSURGICAL) ×2 IMPLANT
FORCEPS BPLR R/ABLATION 8 DVNC (INSTRUMENTS) ×2 IMPLANT
FORCEPS PROGRASP DVNC XI (FORCEP) ×2 IMPLANT
GLOVE BIO SURGEON STRL SZ7 (GLOVE) ×4 IMPLANT
GOWN STRL REUS W/ TWL LRG LVL3 (GOWN DISPOSABLE) ×8 IMPLANT
IRRIGATION STRYKERFLOW (MISCELLANEOUS) IMPLANT
IRRIGATOR STRYKERFLOW (MISCELLANEOUS) IMPLANT
KIT PINK PAD W/HEAD ARE REST (MISCELLANEOUS) ×1 IMPLANT
KIT PINK PAD W/HEAD ARM REST (MISCELLANEOUS) ×2 IMPLANT
LABEL OR SOLS (LABEL) ×2 IMPLANT
MANIFOLD NEPTUNE II (INSTRUMENTS) ×2 IMPLANT
NDL HYPO 22X1.5 SAFETY MO (MISCELLANEOUS) ×2 IMPLANT
NEEDLE HYPO 22X1.5 SAFETY MO (MISCELLANEOUS) ×1 IMPLANT
NS IRRIG 500ML POUR BTL (IV SOLUTION) ×2 IMPLANT
OBTURATOR OPTICAL STND 8 DVNC (TROCAR) ×1 IMPLANT
OBTURATOR OPTICALSTD 8 DVNC (TROCAR) ×2 IMPLANT
PACK LAP CHOLECYSTECTOMY (MISCELLANEOUS) ×2 IMPLANT
SEAL UNIV 5-12 XI (MISCELLANEOUS) ×8 IMPLANT
SET TUBE SMOKE EVAC HIGH FLOW (TUBING) ×2 IMPLANT
SOL ELECTROSURG ANTI STICK (MISCELLANEOUS) ×1 IMPLANT
SOLUTION ELECTROSURG ANTI STCK (MISCELLANEOUS) ×2 IMPLANT
SPIKE FLUID TRANSFER (MISCELLANEOUS) ×2 IMPLANT
SPONGE T-LAP 18X18 ~~LOC~~+RFID (SPONGE) ×2 IMPLANT
SPONGE T-LAP 4X18 ~~LOC~~+RFID (SPONGE) IMPLANT
STOPCOCK 3WAY MALE LL (IV SETS) IMPLANT
SUT MNCRL AB 4-0 PS2 18 (SUTURE) ×2 IMPLANT
SUT VICRYL 0 UR6 27IN ABS (SUTURE) ×4 IMPLANT
SYR 20ML LL LF (SYRINGE) IMPLANT
SYS BAG RETRIEVAL 10MM (BASKET) ×1 IMPLANT
SYSTEM BAG RETRIEVAL 10MM (BASKET) ×2 IMPLANT
TRAP FLUID SMOKE EVACUATOR (MISCELLANEOUS) ×2 IMPLANT
WATER STERILE IRR 3000ML UROMA (IV SOLUTION) IMPLANT
WATER STERILE IRR 500ML POUR (IV SOLUTION) ×2 IMPLANT

## 2023-05-01 NOTE — Anesthesia Postprocedure Evaluation (Signed)
Anesthesia Post Note  Patient: Julie Rios  Procedure(s) Performed: XI ROBOTIC ASSISTED LAPAROSCOPIC CHOLECYSTECTOMY (Abdomen) INDOCYANINE GREEN FLUORESCENCE IMAGING (ICG) (Abdomen)  Patient location during evaluation: PACU Anesthesia Type: General Level of consciousness: awake and alert Pain management: pain level controlled Vital Signs Assessment: post-procedure vital signs reviewed and stable Respiratory status: spontaneous breathing, nonlabored ventilation, respiratory function stable and patient connected to nasal cannula oxygen Cardiovascular status: blood pressure returned to baseline and stable Postop Assessment: no apparent nausea or vomiting Anesthetic complications: no   No notable events documented.   Last Vitals:  Vitals:   05/01/23 1105 05/01/23 1115  BP: 116/70 116/66  Pulse: 84 67  Resp: 16 15  Temp: 36.8 C   SpO2: 95% 100%    Last Pain:  Vitals:   05/01/23 1130  TempSrc:   PainSc: 0-No pain                 Corinda Gubler

## 2023-05-01 NOTE — Anesthesia Preprocedure Evaluation (Signed)
Anesthesia Evaluation  Patient identified by MRN, date of birth, ID band Patient awake    Reviewed: Allergy & Precautions, NPO status , Patient's Chart, lab work & pertinent test results  History of Anesthesia Complications Negative for: history of anesthetic complications  Airway Mallampati: II  TM Distance: >3 FB Neck ROM: Full    Dental no notable dental hx. (+) Teeth Intact   Pulmonary neg pulmonary ROS, neg sleep apnea, neg COPD, Patient abstained from smoking.Not current smoker   Pulmonary exam normal breath sounds clear to auscultation       Cardiovascular Exercise Tolerance: Good METS(-) hypertension(-) CAD and (-) Past MI negative cardio ROS (-) dysrhythmias  Rhythm:Regular Rate:Normal - Systolic murmurs    Neuro/Psych  PSYCHIATRIC DISORDERS Anxiety Depression    negative neurological ROS     GI/Hepatic ,neg GERD  ,,(+)     (-) substance abuse  Denies N/V   Endo/Other  neg diabetes    Renal/GU negative Renal ROS     Musculoskeletal   Abdominal   Peds  Hematology   Anesthesia Other Findings Past Medical History: No date: Anxiety No date: Depression No date: MDD (major depressive disorder), single episode, moderate  (HCC)  Reproductive/Obstetrics Few weeks post partum, breastfeeding                             Anesthesia Physical Anesthesia Plan  ASA: 2  Anesthesia Plan: General   Post-op Pain Management: Tylenol PO (pre-op)*   Induction: Intravenous  PONV Risk Score and Plan: 4 or greater and Ondansetron, Dexamethasone, Midazolam, Propofol infusion and TIVA  Airway Management Planned: Oral ETT and Video Laryngoscope Planned  Additional Equipment: None  Intra-op Plan:   Post-operative Plan: Extubation in OR  Informed Consent: I have reviewed the patients History and Physical, chart, labs and discussed the procedure including the risks, benefits and alternatives  for the proposed anesthesia with the patient or authorized representative who has indicated his/her understanding and acceptance.     Dental advisory given  Plan Discussed with: CRNA and Surgeon  Anesthesia Plan Comments: (Discussed risks of anesthesia with patient, including PONV, sore throat, lip/dental/eye damage. Rare risks discussed as well, such as cardiorespiratory and neurological sequelae, and allergic reactions. Discussed the role of CRNA in patient's perioperative care. Patient understands.  Discussed modern ASA and SOAP society recommendations against the antiquated "pump and dump" for breast milk, encouraged resumption of breastfeeding as soon as she is comfortable.)       Anesthesia Quick Evaluation

## 2023-05-01 NOTE — Op Note (Signed)
Robotic assisted laparoscopic Cholecystectomy  Pre-operative Diagnosis: Acute cholecystitis  Post-operative Diagnosis: same  Procedure:  Robotic assisted laparoscopic Cholecystectomy  Surgeon: Sterling Big, MD FACS  Anesthesia: Gen. with endotracheal tube  Findings: Acute Cholecystitis   Estimated Blood Loss: 5cc       Specimens: Gallbladder           Complications: none   Procedure Details  The patient was seen again in the Holding Room. The benefits, complications, treatment options, and expected outcomes were discussed with the patient. The risks of bleeding, infection, recurrence of symptoms, failure to resolve symptoms, bile duct damage, bile duct leak, retained common bile duct stone, bowel injury, any of which could require further surgery and/or ERCP, stent, or papillotomy were reviewed with the patient. The likelihood of improving the patient's symptoms with return to their baseline status is good.  The patient and/or family concurred with the proposed plan, giving informed consent.  The patient was taken to Operating Room, identified  and the procedure verified as Laparoscopic Cholecystectomy.  A Time Out was held and the above information confirmed.  Prior to the induction of general anesthesia, antibiotic prophylaxis was administered. VTE prophylaxis was in place. General endotracheal anesthesia was then administered and tolerated well. After the induction, the abdomen was prepped with Chloraprep and draped in the sterile fashion. The patient was positioned in the supine position.  Cut down technique was used to enter the abdominal cavity and a Hasson trochar was placed after two vicryl stitches were anchored to the fascia. Pneumoperitoneum was then created with CO2 and tolerated well without any adverse changes in the patient's vital signs.  Three 8-mm ports were placed under direct vision. All skin incisions  were infiltrated with a local anesthetic agent before making the  incision and placing the trocars.   The patient was positioned  in reverse Trendelenburg, robot was brought to the surgical field and docked in the standard fashion.  We made sure all the instrumentation was kept indirect view at all times and that there were no collision between the arms. I scrubbed out and went to the console.  The gallbladder was identified, the fundus grasped and retracted cephalad. Adhesions were lysed bluntly. The infundibulum was grasped and retracted laterally, exposing the peritoneum overlying the triangle of Calot. This was then divided and exposed in a blunt fashion. An extended critical view of the cystic duct and cystic artery was obtained.  The cystic duct was clearly identified and bluntly dissected.   Artery and duct were double clipped and divided. Using ICG cholangiography we visualize the cystic duct and CBD w/o evidence of bile injuries. The gallbladder was taken from the gallbladder fossa in a retrograde fashion with the electrocautery.  Hemostasis was achieved with the electrocautery. Inspection of the right upper quadrant was performed. No bleeding, bile duct injury or leak, or bowel injury was noted. Robotic instruments and robotic arms were undocked in the standard fashion.  I scrubbed back in.  The gallbladder was removed and placed in an Endocatch bag.   Pneumoperitoneum was released.  The periumbilical port site was closed with interrumpted 0 Vicryl sutures. 4-0 subcuticular Monocryl was used to close the skin. Dermabond was  applied.  The patient was then extubated and brought to the recovery room in stable condition. Sponge, lap, and needle counts were correct at closure and at the conclusion of the case.               Sterling Big, MD, FACS

## 2023-05-01 NOTE — Transfer of Care (Signed)
Immediate Anesthesia Transfer of Care Note  Patient: Julie Rios  Procedure(s) Performed: XI ROBOTIC ASSISTED LAPAROSCOPIC CHOLECYSTECTOMY (Abdomen) INDOCYANINE Mahnoor Mathisen FLUORESCENCE IMAGING (ICG) (Abdomen)  Patient Location: PACU  Anesthesia Type:General  Level of Consciousness: drowsy  Airway & Oxygen Therapy: Patient Spontanous Breathing and Patient connected to nasal cannula oxygen  Post-op Assessment: Report given to RN, Post -op Vital signs reviewed and stable, and Patient moving all extremities  Post vital signs: Reviewed and stable  Last Vitals:  Vitals Value Taken Time  BP 116/70 05/01/23 1105  Temp 36.8 C 05/01/23 1105  Pulse 72 05/01/23 1110  Resp 18 05/01/23 1110  SpO2 100 % 05/01/23 1110  Vitals shown include unfiled device data.  Last Pain:  Vitals:   05/01/23 0906  TempSrc: Oral  PainSc:          Complications: No notable events documented.

## 2023-05-01 NOTE — Anesthesia Procedure Notes (Signed)
Procedure Name: Intubation Date/Time: 05/01/2023 10:10 AM  Performed by: [name] Basset, CRNAPre-anesthesia Checklist: Patient identified, Emergency Drugs available, Suction available and Patient being monitored Patient Re-evaluated:Patient Re-evaluated prior to induction Oxygen Delivery Method: Circle system utilized Preoxygenation: Pre-oxygenation with 100% oxygen Induction Type: IV induction Ventilation: Mask ventilation without difficulty Laryngoscope Size: McGrath and 3 Grade View: Grade I Tube type: Oral Tube size: 6.5 mm Number of attempts: 1 Airway Equipment and Method: Stylet, Oral airway and Bite block Placement Confirmation: ETT inserted through vocal cords under direct vision, positive ETCO2 and breath sounds checked- equal and bilateral Secured at: 19 cm Tube secured with: Tape Dental Injury: Teeth and Oropharynx as per pre-operative assessment

## 2023-05-01 NOTE — H&P (Signed)
Patient ID: Julie Rios, female   DOB: Mar 28, 2001, 22 y.o.   MRN: [id number]  HPI Julie Rios is a 22 y.o. female resents with a 3 to 4-day history of severe abdominal pain.  The pain is mainly right upper quadrant and epigastric area.  She reports a severe sharp and worsening with certain movement and when she takes a deep breath. Note she did have a vaginal delivery on December 9.  Did have a history of cholestasis during pregnancy and she did have a history of elevated LFTs which normalized after delivery.  Also reports that during delivery she had some right upper quadrant pain but to a lesser intensity. She did have right upper quadrant ultrasound as well as CT that have personally reviewed showing evidence cholelithiasis.  Distended gallbladder some pericholecystic fluid suggesting cholecystitis.  Bile ducts. CMP shows elevated AST but is otherwise normal.  CBC shows mildly elevated white count and elevated platelets.  Lipase WNL.  Pregnancy test was positive but this is due to patient's recent vaginal delivery, as quantitative beta-hCG shows a significant decreased as compared to when she was actually pregnant   HPI  Past Medical History:  Diagnosis Date   Anxiety    Depression    MDD (major depressive disorder), single episode, moderate (HCC)     Past Surgical History:  Procedure Laterality Date   NO PAST SURGERIES      Family History  Problem Relation Age of Onset   Diabetes Mother    Cirrhosis Mother    Diabetes Father    Colon cancer Neg Hx    Esophageal cancer Neg Hx     Social History Social History   Tobacco Use   Smoking status: Never    Passive exposure: Yes   Smokeless tobacco: Never  Vaping Use   Vaping status: Some Days   Substances: Nicotine  Substance Use Topics   Alcohol use: No    Comment: occ   Drug use: Yes    Types: Other-see comments, Marijuana    Comment: reports use of THC-A approx. nightly    No Known Allergies  Current  Facility-Administered Medications  Medication Dose Route Frequency Provider Last Rate Last Admin   0.9 %  sodium chloride infusion   Intravenous Continuous Lakeisha Waldrop F, MD 125 mL/hr at 05/01/23 0013 New Bag at 05/01/23 0013   acetaminophen (TYLENOL) tablet 1,000 mg  1,000 mg Oral Q6H Pink Maye F, MD   1,000 mg at 05/01/23 0831   ALPRAZolam (XANAX) tablet 0.5 mg  0.5 mg Oral Q6H PRN Lynna Zamorano F, MD       cefTRIAXone (ROCEPHIN) 2 g in sodium chloride 0.9 % 100 mL IVPB  2 g Intravenous Q24H Raynette Arras, Cecelia Byars F, MD   Stopped at 04/30/23 1954   diphenhydrAMINE (BENADRYL) 12.5 MG/5ML elixir 12.5 mg  12.5 mg Oral Q6H PRN Cederick Broadnax F, MD       Or   diphenhydrAMINE (BENADRYL) injection 12.5 mg  12.5 mg Intravenous Q6H PRN Paityn Balsam F, MD       enoxaparin (LOVENOX) injection 40 mg  40 mg Subcutaneous Q24H Theseus Birnie F, MD   40 mg at 04/30/23 1854   HYDROmorphone (DILAUDID) injection 1 mg  1 mg Intravenous Q3H PRN Jameisha Stofko, Cecelia Byars F, MD   1 mg at 05/01/23 0542   ketorolac (TORADOL) 30 MG/ML injection 30 mg  30 mg Intravenous Q6H PRN Sterling Big F, MD   30 mg at 05/01/23 0506   ondansetron (ZOFRAN-ODT) disintegrating  tablet 4 mg  4 mg Oral Q6H PRN Tiziana Cislo F, MD       Or   ondansetron (ZOFRAN) injection 4 mg  4 mg Intravenous Q6H PRN Constantin Hillery F, MD       oxyCODONE (Oxy IR/ROXICODONE) immediate release tablet 5-10 mg  5-10 mg Oral Q4H PRN Jaelyn Bourgoin F, MD   5 mg at 05/01/23 0831   pantoprazole (PROTONIX) injection 40 mg  40 mg Intravenous QHS Sterling Big F, MD   40 mg at 04/30/23 2111   prochlorperazine (COMPAZINE) tablet 10 mg  10 mg Oral Q6H PRN Katura Eatherly, Merri Ray, MD       Or   prochlorperazine (COMPAZINE) injection 5-10 mg  5-10 mg Intravenous Q6H PRN Dalesha Stanback F, MD       zolpidem (AMBIEN) tablet 5 mg  5 mg Oral QHS PRN Leafy Ro, MD         Review of Systems Full ROS  was asked and was negative except for the information on the HPI  Physical Exam Blood pressure 119/77,  pulse 71, temperature 98 F (36.7 C), temperature source Oral, resp. rate 16, height 5\' 4"  (1.626 m), weight 67.6 kg, last menstrual period 07/24/2022, SpO2 98%, unknown if currently breastfeeding. CONSTITUTIONAL: NAD. EYES: Pupils are equal, round, and reactive to light, Sclera are non-icteric. EARS, NOSE, MOUTH AND THROAT: The oropharynx is clear. The oral mucosa is pink and moist. Hearing is intact to voice. LYMPH NODES:  Lymph nodes in the neck are normal. RESPIRATORY:  Lungs are clear. There is normal respiratory effort, with equal breath sounds bilaterally, and without pathologic use of accessory muscles. CARDIOVASCULAR: Heart is regular without murmurs, gallops, or rubs. GI: The abdomen is soft, Tender to palpation RUQ w + Murphy, not peritonitic. There are no palpable masses. There is no hepatosplenomegaly. There are normal bowel sounds  GU: Rectal deferred.   MUSCULOSKELETAL: Normal muscle strength and tone. No cyanosis or edema.   SKIN: Turgor is good and there are no pathologic skin lesions or ulcers. NEUROLOGIC: Motor and sensation is grossly normal. Cranial nerves are grossly intact. PSYCH:  Oriented to person, place and time. Affect is normal.  Data Reviewed  I have personally reviewed the patient's imaging, laboratory findings and medical records.    Assessment/Plan 22 year old female with classic signs and symptoms of acute cholecystitis.  She is 12 days out from a normal vaginal delivery that was uneventful without complications.  Discussed with the patient and with her husband in detail Monday recommendation for prompt cholecystectomy.  We have started IV antibiotics IV fluids and appropriate pain control.  We will perform cholecystectomy robotically today. The risks, benefits, complications, treatment options, and expected outcomes were discussed with the patient. The possibilities of bleeding, uterine injury,  recurrent infection, finding a normal gallbladder, perforation of  viscus organs, damage to surrounding structures, bile leak, abscess formation, needing a drain placed, the need for additional procedures, reaction to medication, pulmonary aspiration,  failure to diagnose a condition, the possible need to convert to an open procedure, and creating a complication requiring transfusion or operation were discussed with the patient. The patient and/or family concurred with the proposed plan, giving informed consent.     Sterling Big, MD FACS General Surgeon 05/01/2023, 9:00 AM

## 2023-05-01 NOTE — Discharge Instructions (Signed)
Laparoscopic Cholecystectomy, Care After  ° °These instructions give you information on caring for yourself after your procedure. Your doctor may also give you more specific instructions. Call your doctor if you have any problems or questions after your procedure.  °HOME CARE  °Change your bandages (dressings) as told by your doctor.  °Keep the wound dry and clean. Wash the wound gently with soap and water. Pat the wound dry with a clean towel.  °Do not take baths, swim, or use hot tubs for 2 weeks, or as told by your doctor.  °Only take medicine as told by your doctor.  °Eat a normal diet as told by your doctor.  °Do not lift anything heavier than 10 pounds (4.5 kg) until your doctor says it is okay.  °Do not play contact sports for 1 week, or as told by your doctor. °GET HELP IF:  °Your wound is red, puffy (swollen), or painful.  °You have yellowish-white fluid (pus) coming from the wound.  °You have fluid draining from the wound for more than 1 day.  °You have a bad smell coming from the wound.  °Your wound breaks open. °GET HELP RIGHT AWAY IF:  °You have trouble breathing.  °You have chest pain.  °You have a fever >101  °You have pain in the shoulders (shoulder strap areas) that is getting worse.  °You feel dizzy or pass out (faint).  °You have severe belly (abdominal) pain.  °You feel sick to your stomach (nauseous) or throw up (vomit) for more than 1 day. ° ° °

## 2023-05-02 ENCOUNTER — Ambulatory Visit: Payer: MEDICAID

## 2023-05-02 NOTE — Progress Notes (Signed)
Patient discharged home. Discharge instructions and prescriptions given and reviewed with patient. Patient verbalized understanding. Escorted out by auxillary.  

## 2023-05-02 NOTE — Discharge Summary (Signed)
[facility] SURGICAL DISCHARGE SUMMARY  Patient ID: Julie Rios MRN: [id number] DOB/AGE: 2000-08-04 22 y.o.  Admit date: 04/30/2023 Discharge date: 05/02/2023  Discharge Diagnoses Patient Active Problem List   Diagnosis Date Noted   Acute cholecystitis 04/30/2023    Consultants None  Procedures 05/01/2023:  Robotic Assisted Laparoscopic Cholecystectomy    HPI: Julie Rios is a 22 y.o. female resents with a 3 to 4-day history of severe abdominal pain.  The pain is mainly right upper quadrant and epigastric area.  She reports a severe sharp and worsening with certain movement and when she takes a deep breath. Note she did have a vaginal delivery on December 9.  Did have a history of cholestasis during pregnancy and she did have a history of elevated LFTs which normalized after delivery.  Also reports that during delivery she had some right upper quadrant pain but to a lesser intensity. She did have right upper quadrant ultrasound as well as CT that have personally reviewed showing evidence cholelithiasis.  Distended gallbladder some pericholecystic fluid suggesting cholecystitis. CMP shows elevated AST but is otherwise normal.  CBC shows mildly elevated white count and elevated platelets.  Lipase WNL.  Pregnancy test was positive but this is due to patient's recent vaginal delivery, as quantitative beta-hCG shows a significant decreased as compared to when she was actually pregnant  Hospital Course: Informed consent was obtained and documented, and patient underwent uneventful robotic assisted laparoscopic cholecystectomy (Dr Everlene Farrier, 05/01/2023).  Post-operatively, patient did well. Advancement of patient's diet and ambulation were well-tolerated. The remainder of patient's hospital course was essentially unremarkable, and discharge planning was initiated accordingly with patient safely able to be discharged home with appropriate discharge instructions, pain control, and  outpatient follow-up after all of her questions were answered to her expressed satisfaction.   Discharge Condition: Good   Physical Examination:  Constitutional: Well appearing female; NAD Pulmonary: Normal effort, no respiratory distress  Gastrointestinal: Soft, incisional soreness, non-distended, no rebound/guarding.  Skin: Laparoscopic incisions are CDI with dermabond, no erythema or drainage    Allergies as of 05/02/2023   No Known Allergies      Medication List     STOP taking these medications    acetaminophen 500 MG tablet Commonly known as: TYLENOL       TAKE these medications    HYDROcodone-acetaminophen 5-325 MG tablet Commonly known as: NORCO/VICODIN Take 1-2 tablets by mouth every 4 (four) hours as needed for moderate pain (pain score 4-6).   hydrOXYzine 25 MG tablet Commonly known as: ATARAX Take 1 tablet (25 mg total) by mouth 3 (three) times daily as needed for anxiety.   ibuprofen 800 MG tablet Commonly known as: ADVIL Take 1 tablet (800 mg total) by mouth every 8 (eight) hours as needed.   senna-docusate 8.6-50 MG tablet Commonly known as: Senokot-S Take 2 tablets by mouth at bedtime as needed for mild constipation.   traZODone 50 MG tablet Commonly known as: DESYREL Take 0.5 tablets (25 mg total) by mouth at bedtime as needed for sleep (THis is a 25 mg tablet - cut in half the first time you take it, but OK to take a full tablet if needed.). Works best as needed.               Discharge Care Instructions  (From admission, onward)           Start     Ordered   05/01/23 0000  No dressing needed  05/01/23 1240              Follow-up Information     Donovan Kail, PA-C. Go on 05/18/2023.   Specialty: Physician Assistant Why: Go to appointment on 01/08 at 230 PM Contact information: [address] [place] [address] [phone]                  Time spent on discharge management  including discussion of hospital course, clinical condition, outpatient instructions, prescriptions, and follow up with the patient and members of the medical team: >30 minutes  -- Lynden Oxford , PA-C [facility]  05/02/2023, 8:06 AM [phone] M-F: 7am - 4pm

## 2023-05-03 LAB — SURGICAL PATHOLOGY

## 2023-05-09 ENCOUNTER — Telehealth: Payer: Self-pay | Admitting: Clinical

## 2023-05-09 NOTE — Telephone Encounter (Signed)
Left HIPPA-compliant message to call back Kaliel Bolds from [facility] at  [phone] (Ranette Luckadoo's office).   

## 2023-05-18 ENCOUNTER — Encounter: Payer: MEDICAID | Admitting: Physician Assistant

## 2023-06-01 ENCOUNTER — Other Ambulatory Visit: Payer: MEDICAID

## 2023-06-01 ENCOUNTER — Ambulatory Visit: Payer: MEDICAID | Admitting: Family Medicine

## 2023-06-01 NOTE — BH Specialist Note (Deleted)
 Integrated Behavioral Health via Telemedicine Visit  06/01/2023 Julie Rios [id number]  Number of Integrated Behavioral Health Clinician visits: No data recorded Session Start time: No data recorded  Session End time: No data recorded Total time in minutes: No data recorded  Referring Provider: Lancaster Bing, MD Julie/Family location: Home*** [facility] Provider location: [facility]  All persons participating in visit: Julie Rios and [facility] Flynn Lininger ***  Types of Service: {CHL AMB TYPE OF [phone]}  I connected with Julie Rios and/or Julie Rios's {family Rios:20773} via  Telephone or Video Enabled Telemedicine Application  (Video is Caregility application) and verified that I am speaking with the correct person using two identifiers. Discussed confidentiality: Yes   I discussed the limitations of telemedicine and the availability of in person appointments.  Discussed there is a possibility of technology failure and discussed alternative modes of communication if that failure occurs.  I discussed that engaging in this telemedicine visit, they consent to the provision of behavioral healthcare and the services will be billed under their insurance.  Julie and/or legal guardian expressed understanding and consented to Telemedicine visit: Yes   Presenting Concerns: Julie and/or family reports the following symptoms/concerns: *** Duration of problem: ***; Severity of problem: {Mild/Moderate/Severe:20260}  Julie and/or Family's Strengths/Protective Factors: {CHL AMB BH PROTECTIVE [phone]}  Goals Addressed: Julie will:  Reduce symptoms of: {IBH Symptoms:21014056}   Increase knowledge and/or ability of: {IBH Julie Tools:21014057}   Demonstrate ability to: {IBH Goals:21014053}  Progress towards Goals: {CHL AMB BH PROGRESS TOWARDS GOALS:[phone]}  Interventions: Interventions  utilized:  {IBH Interventions:21014054} Standardized Assessments completed: {IBH Screening Tools:21014051}  Julie and/or Family Response: Julie agrees with treatment plan. ***  Assessment: Julie currently experiencing ***.   Julie may benefit from psychoeducation and brief therapeutic interventions regarding coping with symptoms of *** .  Plan: Follow up with behavioral health clinician on : *** Behavioral recommendations:  -*** -*** Referral(s): {IBH Referrals:21014055}  I discussed the assessment and treatment plan with the Julie and/or parent/guardian. They were provided an opportunity to ask questions and all were answered. They agreed with the plan and demonstrated an understanding of the instructions.   They were advised to call back or seek an in-person evaluation if the symptoms worsen or if the condition fails to improve as anticipated.  Valetta Close Gauri Galvao, LCSW     03/07/2023    3:03 PM 02/01/2023    9:08 AM 10/27/2022   10:31 AM 09/23/2021    3:50 PM 09/03/2021    4:11 PM  Depression screen PHQ 2/9  Decreased Interest 0 0 0    Down, Depressed, Hopeless 1 2 0    PHQ - 2 Score 1 2 0    Altered sleeping  1 2    Tired, decreased energy  1 2    Change in appetite  2 0    Feeling bad or failure about yourself   3 2    Trouble concentrating  1 0    Moving slowly or fidgety/restless  2 0    Suicidal thoughts  2 0    PHQ-9 Score  14 6    Difficult doing work/chores   Not difficult at all       Information is confidential and restricted. Go to Review Flowsheets to unlock data.      02/01/2023    9:11 AM 10/27/2022   10:35 AM 09/23/2021    3:51 PM 07/23/2021    2:51 PM  GAD 7 : Generalized Anxiety Score  Nervous, Anxious, on Edge 1 2    Control/stop worrying 3 3    Worry too much - different things 3 2    Trouble relaxing 3 3    Restless 2 2    Easily annoyed or irritable 3 2    Afraid - awful might happen 3 2    Total GAD 7 Score 18 16    Anxiety  Difficulty         Information is confidential and restricted. Go to Review Flowsheets to unlock data.

## 2023-06-07 ENCOUNTER — Ambulatory Visit: Payer: MEDICAID | Admitting: Obstetrics and Gynecology

## 2023-06-07 ENCOUNTER — Other Ambulatory Visit: Payer: MEDICAID

## 2023-06-07 NOTE — Progress Notes (Unsigned)
BH MD Outpatient Progress Note  06/08/2023 4:06 PM Julie Rios  MRN:  [id number]  Assessment:  Julie Rios presents for follow-up evaluation. Today, 06/08/23, patient is seen after labor and delivery 12/9. She reports that since returning home she has experienced emergent irritability, low mood, and frequent worrying and restlessness. She reports secure bond with and enjoyment of baby and is engaging in self-care and fulfilling household responsibilities. While anxiety and irritability could be normal features of current relational and psychosocial stressors, she identifies persistence of these symptoms and is interested in restarting Prozac given prior benefit. She reports she has initial therapy appointment tomorrow with community provider. No acute safety concerns.   RTC in 5 weeks by video.  Identifying Information: Julie Rios is a 23 y.o. [id number] female postpartum s/p SVD 04/18/2023 with a history of MDD and GAD who is an established patient with [facility] participating in follow-up via video conferencing.   Plan:  # MDD currently in remission  GAD Past medication trials: Wellbutrin XL, Prozac Status of problem: recurrence Interventions: -- START Prozac 20 mg daily -- Risks, benefits, and side effects including but not limited to GI upset, sleep disturbance, increased anxiety, and FDA black box warning in young adults were reviewed with informed consent provided -- Continue Atarax TID PRN anxiety -- Patient is not breastfeeding -- Patient reports she has therapy appointment tomorrow with another provider in community  Patient was given contact information for behavioral health clinic and was instructed to call 911 for emergencies.   Subjective:  Chief Complaint:  Chief Complaint  Patient presents with   Medication Management    Interval History:   Patient reports delivery was a bit eventful as she had to be induced; baby was healthy. 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 Reports she was initially trying to pump but supply dwindled; shares feelings of sadness related to inability to provide milk.  Reports mood was holding up okay in the hospital however since getting home has been feeling down and "more numb" in addition to significant irritability. Experienced frequent crying the first week she was home although this has improved. Endorses intact energy/motivation to fulfill responsibilities surrounding baby and household. Reports some increased anxiety related to home repairs and financial stress. Has found hydroxyzine helpful for anxiety; using about once every other day typically when leaving the house with noted benefit. Endorses frequent restlessness and trouble feeling calm. Reports overall stable appetite. Identifies sleep is disrupted due to baby however she and boyfriend have been taking shifts - she is able to get 3 hours stretches of sleep at a time protected. Hasn't used trazodone due to concern for inability to wake up for baby. Endorses she has stayed on top of self-care in terms of showering and brushing teeth.   Reports feeling "great" around baby and has a secure sense of attachment. Denies SI or HI/thoughts of harm towards baby.  Living with boyfriend; feels safe at home. However, identifies stress related to boyfriend losing job soon before baby was born and learning about boyfriend's infidelity a month prior to arrival of baby.   She does feel irritability has been more persistent than she would have expected given the circumstances; while she has certain reasons to feel upset she identifies it has been hard to rest and relax even during moments of calm.   Expresses interest in restarting medication for depression/anxiety; she identifies past benefit from Prozac for mood and perhaps  anxiety. She tolerated well although reports she may have hallucinated at higher dosing. Sometimes has  difficulty remembering to take a medication every day. Amenable to restarting Prozac at this time. Has a therapy appointment tomorrow. No longer using THC.   All questions/concerns addressed.  Visit Diagnosis:    ICD-10-CM   1. Moderate episode of recurrent major depressive disorder (HCC)  F33.1     2. Generalized anxiety disorder  F41.1      Past Psychiatric History:  Diagnoses: MDD, GAD Medication trials: Wellbutrin XL 150 mg, Prozac up to 40 mg daily Substance use:  -- Denies use of THC-A nightly since delivery (previously using to help with insomnia and N/V in pregnancy) -- Denies etoh, tobacco, or illicit drug use   Past Medical History:  Past Medical History:  Diagnosis Date   Anxiety    Depression    MDD (major depressive disorder), single episode, moderate (HCC)     Past Surgical History:  Procedure Laterality Date   NO PAST SURGERIES     Family Psychiatric History:  Sister - Depression, borderline personality   Family History:  Family History  Problem Relation Age of Onset   Diabetes Mother    Cirrhosis Mother    Diabetes Father    Colon cancer Neg Hx    Esophageal cancer Neg Hx    Social History:  Social History   Socioeconomic History   Marital status: Single    Spouse name: Casimiro Needle   Number of children: Not on file   Years of education: Not on file   Highest education level: Not on file  Occupational History   Not on file  Tobacco Use   Smoking status: Never    Passive exposure: Yes   Smokeless tobacco: Never  Vaping Use   Vaping status: Some Days   Substances: Nicotine  Substance and Sexual Activity   Alcohol use: No    Comment: occ   Drug use: Not Currently    Types: Other-see comments, Marijuana   Sexual activity: Yes    Birth control/protection: None  Other Topics Concern   Not on file  Social History Narrative   Not on file   Social Drivers of Health   Financial Resource Strain: Not on file  Food Insecurity: Food Insecurity  Present (04/17/2023)   Hunger Vital Sign    Worried About Running Out of Food in the Last Year: Sometimes true    Ran Out of Food in the Last Year: Never true  Transportation Needs: No Transportation Needs (04/17/2023)   PRAPARE - Administrator, Civil Service (Medical): No    Lack of Transportation (Non-Medical): No  Physical Activity: Not on file  Stress: Not on file  Social Connections: Unknown (09/22/2021)   [facility], [facility]   Social Network    Social Network: Not on file    Allergies: No Known Allergies  Current Medications: Current Outpatient Medications  Medication Sig Dispense Refill   FLUoxetine (PROZAC) 20 MG capsule Take 1 capsule (20 mg total) by mouth in the morning. 30 capsule 1   hydrOXYzine (ATARAX) 25 MG tablet Take 1 tablet (25 mg total) by mouth 3 (three) times daily as needed for anxiety. 90 tablet 2   HYDROcodone-acetaminophen (NORCO/VICODIN) 5-325 MG tablet Take 1-2 tablets by mouth every 4 (four) hours as needed for moderate pain (pain score 4-6). 25 tablet 0   ibuprofen (ADVIL) 800 MG tablet Take 1 tablet (800 mg total) by mouth every 8 (  eight) hours as needed. 30 tablet 2   senna-docusate (SENOKOT-S) 8.6-50 MG tablet Take 2 tablets by mouth at bedtime as needed for mild constipation. 60 tablet 1   traZODone (DESYREL) 50 MG tablet Take 0.5 tablets (25 mg total) by mouth at bedtime as needed for sleep (THis is a 25 mg tablet - cut in half the first time you take it, but OK to take a full tablet if needed.). Works best as needed. (Patient not taking: Reported on 06/08/2023) 15 tablet 0   No current facility-administered medications for this visit.    ROS: Denies any physical complants  Objective:  Psychiatric Specialty Exam: not currently breastfeeding.There is no height or weight on file to calculate BMI.  General Appearance: Casual and Fairly Groomed  Eye Contact:  Good  Speech:  Clear and Coherent and Normal Rate   Volume:  Normal  Mood:   "irritable"  Affect:   Dysthymic; calm  Thought Content:  Denies AVH; IOR; no overt delusional content on interview    Suicidal Thoughts:  No  Homicidal Thoughts:  No  Thought Process:  Goal Directed and Linear  Orientation:  Full (Time, Place, and Person)    Memory:   Grossly intact  Judgment:  Good  Insight:  Good  Concentration:  Concentration: Good  Recall:  NA  Fund of Knowledge: Good  Language: Good  Psychomotor Activity:  Normal  Akathisia:  No  AIMS (if indicated): not done  Assets:  Communication Skills Desire for Improvement Housing Intimacy Physical Health Social Support Transportation Vocational/Educational  ADL's:  Intact  Cognition: WNL  Sleep:   fair - disrupted due to newborn care   PE: General: sits comfortably in view of camera; no acute distress  Pulm: no increased work of breathing on room air  MSK: all extremity movements appear intact  Neuro: no focal neurological deficits observed  Gait & Station: unable to assess by video    Metabolic Disorder Labs: Lab Results  Component Value Date   HGBA1C 5.1 10/06/2022   MPG 99.67 04/17/2021   No results found for: "PROLACTIN" Lab Results  Component Value Date   CHOL 150 04/17/2021   TRIG 71 04/17/2021   HDL 38 (L) 04/17/2021   CHOLHDL 3.9 04/17/2021   VLDL 14 04/17/2021   LDLCALC 98 04/17/2021   Lab Results  Component Value Date   TSH 1.744 04/04/2023   TSH 0.424 (L) 10/06/2022    Therapeutic Level Labs: No results found for: "LITHIUM" No results found for: "VALPROATE" No results found for: "CBMZ"  Screenings:  GAD-7    Flowsheet Row Routine Prenatal from 02/01/2023 in [facility] at [facility] Initial Prenatal from 10/27/2022 in [facility] at [facility] Video Visit from 09/23/2021 in [facility] Video Visit from 07/23/2021 in [facility]  Counselor from 06/18/2021 in [facility]  Total GAD-7 Score 18 16 12 16  0      PHQ2-9    Flowsheet Row Appointment from 03/07/2023 in [facility] MATERNAL FETAL CARE IMAGING Routine Prenatal from 02/01/2023 in [facility] for [facility] at [facility] Initial Prenatal from 10/27/2022 in [facility] for [facility] Healthcare at [facility] Video Visit from 09/23/2021 in [facility] Counselor from 09/03/2021 in [facility]  PHQ-2 Total Score 1 2 0 1 0  PHQ-9 Total Score -- 14 6 -- 0  Flowsheet Row ED to Hosp-Admission (Discharged) from 04/30/2023 in [facility]ABY Admission (Discharged) from 04/17/2023 in [facility] 4S Mother Baby Unit Admission (Discharged) from 04/04/2023 in [facility] 1S Maternity Assessment Unit  C-SSRS RISK CATEGORY No Risk No Risk No Risk       Collaboration of Care: Collaboration of Care: Medication Management AEB active medication management, Psychiatrist AEB established with this provider, and Other provider involved in patient's care AEB referral placed for individual psychotherapy  Patient/Guardian was advised Release of Information must be obtained prior to any record release in order to collaborate their care with an outside provider. Patient/Guardian was advised if they have not already done so to contact the registration department to sign all necessary forms in order for [place] to release information regarding their care.   Consent: Patient/Guardian gives verbal consent for treatment and assignment of benefits for services provided during this visit. Patient/Guardian expressed understanding and agreed to proceed.   Televisit via video: I connected with patient on 06/08/23 at  1:00 PM EST by a video enabled telemedicine application and verified that I am speaking with the correct person using two identifiers.  Location: Patient: home address in  [place] Provider: remote office in [place]   I discussed the limitations of evaluation and management by telemedicine and the availability of in person appointments. The patient expressed understanding and agreed to proceed.  I discussed the assessment and treatment plan with the patient. The patient was provided an opportunity to ask questions and all were answered. The patient agreed with the plan and demonstrated an understanding of the instructions.   The patient was advised to call back or seek an in-person evaluation if the symptoms worsen or if the condition fails to improve as anticipated.  I provided 35 minutes dedicated to the care of this patient via video on the date of this encounter to include chart review, face-to-face time with the patient, medication management/counseling, brief therapeutic support.  Tannon Peerson A Saleah Rishel 06/08/2023, 4:06 PM

## 2023-06-08 ENCOUNTER — Telehealth (HOSPITAL_COMMUNITY): Payer: MEDICAID | Admitting: Psychiatry

## 2023-06-08 ENCOUNTER — Encounter (HOSPITAL_COMMUNITY): Payer: Self-pay | Admitting: Psychiatry

## 2023-06-08 DIAGNOSIS — F411 Generalized anxiety disorder: Secondary | ICD-10-CM

## 2023-06-08 DIAGNOSIS — F331 Major depressive disorder, recurrent, moderate: Secondary | ICD-10-CM | POA: Diagnosis not present

## 2023-06-08 MED ORDER — FLUOXETINE HCL 20 MG PO CAPS: 20.0000 mg | ORAL_CAPSULE | Freq: Every morning | ORAL | 1 refills | Status: DC

## 2023-06-08 NOTE — Patient Instructions (Addendum)
Thank you for attending your appointment today.  -- START Prozac 20 mg daily -- Continue Atarax 25 mg up to three times daily as needed for anxiety/sleep -- Continue other medications as prescribed.  Please do not make any changes to medications without first discussing with your provider. If you are experiencing a psychiatric emergency, please call 911 or present to your nearest emergency department. Additional crisis, medication management, and therapy resources are included below.  [facility]  [address], [place], [place] [address] [phone] WALK-IN URGENT CARE 24/7 FOR ANYONE [address] [place], [place]  [phone] Fax: [phone] [email or web address] *Interpreters available *Accepts all insurance and uninsured for Urgent Care needs *Accepts Medicaid and uninsured for outpatient treatment (below)      ONLY FOR [employer]  Below:    Outpatient New Patient Assessment/Therapy Walk-ins:        Monday, Wednesday, and Thursday 8am until slots are full (first come, first served)                   New Patient Psychiatry/Medication Management        Monday-Friday 8am-11am (first come, first served)               For all walk-ins we ask that you arrive by 7:15am, because patients will be seen in the order of arrival.

## 2023-07-11 NOTE — Progress Notes (Unsigned)
 Patient did not connect for virtual psychiatric medication management appointment on 06/15/23 at 3:30PM. Sent secure video link with no response. Called phone with no answer; left VM with callback number to reschedule.  Daine Gip, MD 07/13/23

## 2023-07-13 ENCOUNTER — Encounter (HOSPITAL_COMMUNITY): Payer: Self-pay

## 2023-07-13 ENCOUNTER — Encounter (HOSPITAL_COMMUNITY): Payer: MEDICAID | Admitting: Psychiatry

## 2023-07-23 ENCOUNTER — Ambulatory Visit (HOSPITAL_COMMUNITY)
Admission: EM | Admit: 2023-07-23 | Discharge: 2023-07-24 | Disposition: A | Discharge status: Other Acute Inpatient Hospital | Payer: MEDICAID | Attending: Urology | Admitting: Urology

## 2023-07-23 DIAGNOSIS — F53 Postpartum depression: Secondary | ICD-10-CM | POA: Insufficient documentation

## 2023-07-23 DIAGNOSIS — F411 Generalized anxiety disorder: Secondary | ICD-10-CM | POA: Insufficient documentation

## 2023-07-23 DIAGNOSIS — Z79899 Other long term (current) drug therapy: Secondary | ICD-10-CM | POA: Insufficient documentation

## 2023-07-23 DIAGNOSIS — R45851 Suicidal ideations: Secondary | ICD-10-CM | POA: Insufficient documentation

## 2023-07-23 DIAGNOSIS — F332 Major depressive disorder, recurrent severe without psychotic features: Secondary | ICD-10-CM | POA: Diagnosis not present

## 2023-07-23 MED ORDER — HYDROXYZINE HCL 25 MG PO TABS
25.0000 mg | ORAL_TABLET | Freq: Three times a day (TID) | ORAL | Status: DC | PRN
  Administered 2023-07-24: 25 mg via ORAL
  Filled 2023-07-23: qty 1

## 2023-07-23 MED ORDER — LORAZEPAM 2 MG/ML IJ SOLN: 2.0000 mg | Freq: Three times a day (TID) | INTRAMUSCULAR | Status: DC | PRN

## 2023-07-23 MED ORDER — HALOPERIDOL 5 MG PO TABS: 5.0000 mg | ORAL_TABLET | Freq: Three times a day (TID) | ORAL | Status: DC | PRN

## 2023-07-23 MED ORDER — HALOPERIDOL LACTATE 5 MG/ML IJ SOLN: 10.0000 mg | Freq: Three times a day (TID) | INTRAMUSCULAR | Status: DC | PRN

## 2023-07-23 MED ORDER — TRAZODONE HCL 50 MG PO TABS: 50.0000 mg | ORAL_TABLET | Freq: Every evening | ORAL | Status: DC | PRN

## 2023-07-23 MED ORDER — ACETAMINOPHEN 325 MG PO TABS: 650.0000 mg | ORAL_TABLET | Freq: Four times a day (QID) | ORAL | Status: DC | PRN

## 2023-07-23 MED ORDER — DIPHENHYDRAMINE HCL 50 MG/ML IJ SOLN: 50.0000 mg | Freq: Three times a day (TID) | INTRAMUSCULAR | Status: DC | PRN

## 2023-07-23 MED ORDER — HALOPERIDOL LACTATE 5 MG/ML IJ SOLN: 5.0000 mg | Freq: Three times a day (TID) | INTRAMUSCULAR | Status: DC | PRN

## 2023-07-23 MED ORDER — ALUM & MAG HYDROXIDE-SIMETH 200-200-20 MG/5ML PO SUSP: 30.0000 mL | ORAL | Status: DC | PRN

## 2023-07-23 MED ORDER — MAGNESIUM HYDROXIDE 400 MG/5ML PO SUSP: 30.0000 mL | Freq: Every day | ORAL | Status: DC | PRN

## 2023-07-23 MED ORDER — DIPHENHYDRAMINE HCL 50 MG PO CAPS: 50.0000 mg | ORAL_CAPSULE | Freq: Three times a day (TID) | ORAL | Status: DC | PRN

## 2023-07-24 ENCOUNTER — Encounter (HOSPITAL_COMMUNITY): Payer: Self-pay | Admitting: Psychiatry

## 2023-07-24 ENCOUNTER — Inpatient Hospital Stay (HOSPITAL_COMMUNITY)
Admission: AD | Admit: 2023-07-24 | Discharge: 2023-07-28 | DRG: 885 | Disposition: A | Discharge status: Home / Self Care | Payer: MEDICAID | Source: Intra-hospital | Attending: Psychiatry | Admitting: Psychiatry

## 2023-07-24 ENCOUNTER — Other Ambulatory Visit: Payer: Self-pay

## 2023-07-24 DIAGNOSIS — Z9151 Personal history of suicidal behavior: Secondary | ICD-10-CM | POA: Diagnosis not present

## 2023-07-24 DIAGNOSIS — Z79899 Other long term (current) drug therapy: Secondary | ICD-10-CM | POA: Diagnosis not present

## 2023-07-24 DIAGNOSIS — F411 Generalized anxiety disorder: Secondary | ICD-10-CM | POA: Diagnosis present

## 2023-07-24 DIAGNOSIS — F17290 Nicotine dependence, other tobacco product, uncomplicated: Secondary | ICD-10-CM | POA: Diagnosis present

## 2023-07-24 DIAGNOSIS — Z8632 Personal history of gestational diabetes: Secondary | ICD-10-CM

## 2023-07-24 DIAGNOSIS — E559 Vitamin D deficiency, unspecified: Secondary | ICD-10-CM | POA: Diagnosis present

## 2023-07-24 DIAGNOSIS — F410 Panic disorder [episodic paroxysmal anxiety] without agoraphobia: Secondary | ICD-10-CM | POA: Diagnosis present

## 2023-07-24 DIAGNOSIS — R4589 Other symptoms and signs involving emotional state: Secondary | ICD-10-CM | POA: Diagnosis present

## 2023-07-24 DIAGNOSIS — F17200 Nicotine dependence, unspecified, uncomplicated: Secondary | ICD-10-CM | POA: Diagnosis present

## 2023-07-24 DIAGNOSIS — E538 Deficiency of other specified B group vitamins: Secondary | ICD-10-CM | POA: Diagnosis present

## 2023-07-24 DIAGNOSIS — F332 Major depressive disorder, recurrent severe without psychotic features: Principal | ICD-10-CM | POA: Diagnosis present

## 2023-07-24 DIAGNOSIS — Z634 Disappearance and death of family member: Secondary | ICD-10-CM

## 2023-07-24 LAB — COMPREHENSIVE METABOLIC PANEL
ALT: 16 U/L (ref 0–44)
AST: 17 U/L (ref 15–41)
Albumin: 4.1 g/dL (ref 3.5–5.0)
Alkaline Phosphatase: 55 U/L (ref 38–126)
Anion gap: 10 (ref 5–15)
BUN: 6 mg/dL (ref 6–20)
CO2: 21 mmol/L — ABNORMAL LOW (ref 22–32)
Calcium: 9.1 mg/dL (ref 8.9–10.3)
Chloride: 107 mmol/L (ref 98–111)
Creatinine, Ser: 0.6 mg/dL (ref 0.44–1.00)
GFR, Estimated: >60 mL/min (ref >60)
Glucose, Bld: 79 mg/dL (ref 70–99)
Potassium: 4.3 mmol/L (ref 3.5–5.1)
Sodium: 138 mmol/L (ref 135–145)
Total Bilirubin: 0.4 mg/dL (ref 0.0–1.2)
Total Protein: 6.9 g/dL (ref 6.5–8.1)

## 2023-07-24 LAB — LIPID PANEL
Cholesterol: 142 mg/dL (ref 0–200)
HDL: 39 mg/dL — ABNORMAL LOW (ref >40)
LDL Cholesterol: 92 mg/dL (ref 0–99)
Total CHOL/HDL Ratio: 3.6 ratio
Triglycerides: 56 mg/dL (ref <150)
VLDL: 11 mg/dL (ref 0–40)

## 2023-07-24 LAB — CBC WITH DIFFERENTIAL/PLATELET
Abs Immature Granulocytes: 0.03 10*3/uL (ref 0.00–0.07)
Basophils Absolute: 0 10*3/uL (ref 0.0–0.1)
Basophils Relative: 0 %
Eosinophils Absolute: 0.1 10*3/uL (ref 0.0–0.5)
Eosinophils Relative: 1 %
HCT: 37.3 % (ref 36.0–46.0)
Hemoglobin: 12.4 g/dL (ref 12.0–15.0)
Immature Granulocytes: 0 %
Lymphocytes Relative: 13 %
Lymphs Abs: 1.5 10*3/uL (ref 0.7–4.0)
MCH: 27 pg (ref 26.0–34.0)
MCHC: 33.2 g/dL (ref 30.0–36.0)
MCV: 81.1 fL (ref 80.0–100.0)
Monocytes Absolute: 0.5 10*3/uL (ref 0.1–1.0)
Monocytes Relative: 4 %
Neutro Abs: 9.4 10*3/uL — ABNORMAL HIGH (ref 1.7–7.7)
Neutrophils Relative %: 82 %
Platelets: 412 10*3/uL — ABNORMAL HIGH (ref 150–400)
RBC: 4.6 MIL/uL (ref 3.87–5.11)
RDW: 16 % — ABNORMAL HIGH (ref 11.5–15.5)
WBC: 11.5 10*3/uL — ABNORMAL HIGH (ref 4.0–10.5)
nRBC: 0 % (ref 0.0–0.2)

## 2023-07-24 LAB — TSH: TSH: 0.898 u[IU]/mL (ref 0.350–4.500)

## 2023-07-24 LAB — POCT URINE DRUG SCREEN - MANUAL ENTRY (I-SCREEN)
POC Amphetamine UR: NOT DETECTED
POC Buprenorphine (BUP): NOT DETECTED
POC Cocaine UR: NOT DETECTED
POC Marijuana UR: POSITIVE — AB
POC Methadone UR: NOT DETECTED
POC Methamphetamine UR: NOT DETECTED
POC Morphine: NOT DETECTED
POC Oxazepam (BZO): NOT DETECTED
POC Oxycodone UR: NOT DETECTED
POC Secobarbital (BAR): NOT DETECTED

## 2023-07-24 LAB — ETHANOL: Alcohol, Ethyl (B): <10 mg/dL (ref <10)

## 2023-07-24 LAB — POC URINE PREG, ED: Preg Test, Ur: NEGATIVE

## 2023-07-24 LAB — HEMOGLOBIN A1C
Hgb A1c MFr Bld: 4.7 % — ABNORMAL LOW (ref 4.8–5.6)
Mean Plasma Glucose: 88.19 mg/dL

## 2023-07-24 MED ORDER — HYDROXYZINE HCL 25 MG PO TABS
25.0000 mg | ORAL_TABLET | Freq: Three times a day (TID) | ORAL | Status: DC | PRN
  Administered 2023-07-26 – 2023-07-28 (×2): 25 mg via ORAL
  Filled 2023-07-24 (×2): qty 1

## 2023-07-24 MED ORDER — MAGNESIUM HYDROXIDE 400 MG/5ML PO SUSP: 30.0000 mL | Freq: Every day | ORAL | Status: DC | PRN

## 2023-07-24 MED ORDER — ALUM & MAG HYDROXIDE-SIMETH 200-200-20 MG/5ML PO SUSP: 30.0000 mL | ORAL | Status: DC | PRN

## 2023-07-24 MED ORDER — TRAZODONE HCL 50 MG PO TABS
50.0000 mg | ORAL_TABLET | Freq: Every evening | ORAL | Status: DC | PRN
  Administered 2023-07-24 – 2023-07-26 (×3): 50 mg via ORAL
  Filled 2023-07-24 (×4): qty 1

## 2023-07-24 MED ORDER — FLUOXETINE HCL 20 MG PO CAPS
20.0000 mg | ORAL_CAPSULE | Freq: Every morning | ORAL | Status: DC
  Administered 2023-07-25: 20 mg via ORAL
  Filled 2023-07-24 (×4): qty 1

## 2023-07-24 MED ORDER — HALOPERIDOL 5 MG PO TABS: 5.0000 mg | ORAL_TABLET | Freq: Three times a day (TID) | ORAL | Status: DC | PRN

## 2023-07-24 MED ORDER — DIPHENHYDRAMINE HCL 25 MG PO CAPS: 50.0000 mg | ORAL_CAPSULE | Freq: Three times a day (TID) | ORAL | Status: DC | PRN

## 2023-07-24 MED ORDER — ACETAMINOPHEN 325 MG PO TABS
650.0000 mg | ORAL_TABLET | Freq: Four times a day (QID) | ORAL | Status: DC | PRN
  Administered 2023-07-25: 650 mg via ORAL
  Filled 2023-07-24: qty 2

## 2023-07-24 NOTE — ED Notes (Signed)
 Patient is bed calm and sleeping comfortably. NAD. Will continue to monitor for safety.

## 2023-07-24 NOTE — ED Notes (Signed)
 Patient given boxed lunch  juice

## 2023-07-24 NOTE — Tx Team (Signed)
 Initial Treatment Plan 07/24/2023 5:52 PM [name] ZOX:[id number]    PATIENT STRESSORS: Marital or family conflict     PATIENT STRENGTHS: Physical Health  Supportive family/friends    PATIENT IDENTIFIED PROBLEMS: Would like to have better coping skills  Medication compliance                   DISCHARGE CRITERIA:  Improved stabilization in mood, thinking, and/or behavior Safe-care adequate arrangements made Verbal commitment to aftercare and medication compliance  PRELIMINARY DISCHARGE PLAN: Outpatient therapy Placement in alternative living arrangements  PATIENT/FAMILY INVOLVEMENT: This treatment plan has been presented to and reviewed with the patient, Julie Rios.  The patient and family have been given the opportunity to ask questions and make suggestions.  Seymour Bars, RN 07/24/2023, 5:52 PM

## 2023-07-24 NOTE — ED Provider Notes (Signed)
 [facility] Continuous Assessment Admission H&P  Date: 07/24/23 Patient Name: Julie Rios MRN: [id number] Chief Complaint: depression  Diagnoses:  Final diagnoses:  Severe episode of recurrent major depressive disorder, without psychotic features [facility])    HPI: Julie Rios is a 23 y/o female with a history of postpartum depression, major depressive disorder, and generalized anxiety disorder (GAD). Patient presents under Involuntary Commitment due to worsening depressive symptoms and suicidal ideation.  Per IVC petition: Pt was suicidal and cut her wrist with a steak knife. The pt has been diagnoised with anxiety,serve depression, and postpartum depression. Pt is prescribed proxac, atarax, and desyrel. The pt also reported that she is seeing dark figures in hallways and hearing voices. According to the petitoner (pt's boyfriend) pt has made a statement about wanting to leave the world. Pt is self mutilatinf by hitting her head against walls, dressers, doors and head boards. The pt has a hx of committment to [facility] and is a danger to herself."     Patient was evaluated face-to-face and her chart was reviewed by this nurse practitioner.  On assessment, she reports ongoing relationship conflict with her boyfriend; she reports that he repeatedly cheated on her during her pregnancy, and she discovered on 07/20/23 that he is still engaging in infidelity. The patient acknowledges experiencing depressive symptoms but notes that she was able to speak with a friend, which helped her feel better. She admits to self-harming on 07/20/23 after discovering that her boyfriend was unfaithful. She denies current suicidal ideation and claims that she is doing well mentally, though she appears to be minimizing her distress. The patient reports no worsening symptoms of depression and states that she has been taking her medication as prescribed.   Patient gave verbal consent for this NP to speak with her boyfriend's  mother. Collateral information from the patient's boyfriend's mother, Cyndra Numbers [phone]) suggests significant concerns. The mother reports that the patient is depressed, has not been following up with appointments, and missed her six-week postpartum checkup as well as a surgical follow-up. The boyfriend's mother also describes the patient as expressing hopelessness, with statements such as "my daughter won't miss me or be affected if I'm not here." The patient is no longer engaging in previously enjoyed activities, such as art, and feels worthless, believing she is not adding value to her family. She is described as being constantly anxious, unable to function outside of the home, and refusing to go out in public. The boyfriend's mother is deeply concerned about the patient's mental and emotional health.   Collateral Per IVC petitioner, Casimiro Needle May (pt's boyfriend): Casimiro Needle says he feels patient is a endanger to herself and has not been doing well mentally. He states patient has made several suicidal comments and he is concerned about her mental health and safety.   Patient is alert and orientedx4. Her speech is coherent. Her is depressed, and her affect is restricted. The patient's thought process is logical but marked by some signs of hopelessness and low self-worth.  She denies any delusions or hallucinations. Insight into her depression appears limited, as she is downplaying the severity of her symptoms. Judgment is somewhat impaired, particularly as she is not following up with medical appointments and has been isolating herself. There are no signs of psychosis, but the patient's overall emotional state, including her isolation and hopelessness, is concerning.  Total Time spent with patient: 45 minutes  Musculoskeletal  Strength & Muscle Tone: within normal limits Gait & Station: normal Patient leans:  Right  Psychiatric Specialty Exam  Presentation General Appearance:  Appropriate for  Environment  Eye Contact: Good  Speech: Clear and Coherent  Speech Volume: Normal  Handedness: Right   Mood and Affect  Mood: Depressed; Anxious  Affect: Appropriate   Thought Process  Thought Processes: Coherent  Descriptions of Associations:Intact  Orientation:Full (Time, Place and Person)  Thought Content:WDL  Diagnosis of Schizophrenia or Schizoaffective disorder in past: No   Hallucinations:Hallucinations: None  Ideas of Reference:None  Suicidal Thoughts:Suicidal Thoughts: No  Homicidal Thoughts:Homicidal Thoughts: No   Sensorium  Memory: Immediate Good; Remote Good; Recent Good  Judgment: Good  Insight: Good   Executive Functions  Concentration: Good  Attention Span: Good  Recall: Good  Fund of Knowledge: Good  Language: Good   Psychomotor Activity  Psychomotor Activity: Psychomotor Activity: Normal   Assets  Assets: Communication Skills; Desire for Improvement; Housing; Social Support; Physical Health   Sleep  Sleep: Sleep: Fair Number of Hours of Sleep: 5   Nutritional Assessment (For OBS and FBC admissions only) Has the patient had a weight loss or gain of 10 pounds or more in the last 3 months?: No Has the patient had a decrease in food intake/or appetite?: No Does the patient have dental problems?: No Does the patient have eating habits or behaviors that may be indicators of an eating disorder including binging or inducing vomiting?: No Has the patient recently lost weight without trying?: 0 Has the patient been eating poorly because of a decreased appetite?: 0 Malnutrition Screening Tool Score: 0    Physical Exam Vitals and nursing note reviewed.  Constitutional:      General: She is not in acute distress.    Appearance: She is well-developed.  HENT:     Head: Normocephalic and atraumatic.  Eyes:     Conjunctiva/sclera: Conjunctivae normal.  Cardiovascular:     Rate and Rhythm: Normal rate.   Pulmonary:     Effort: Pulmonary effort is normal.  Abdominal:     Palpations: Abdomen is soft.     Tenderness: There is no abdominal tenderness.  Musculoskeletal:     Cervical back: Normal range of motion.  Skin:    General: Skin is warm.  Neurological:     Mental Status: She is alert and oriented to person, place, and time.  Psychiatric:        Attention and Perception: Attention normal.        Mood and Affect: Mood is anxious and depressed. Affect is tearful.        Speech: Speech normal.        Behavior: Behavior normal. Behavior is cooperative.        Thought Content: Thought content normal.    Review of Systems  Constitutional: Negative.   HENT: Negative.    Eyes: Negative.   Respiratory: Negative.    Cardiovascular: Negative.   Gastrointestinal: Negative.   Genitourinary: Negative.   Musculoskeletal: Negative.   Skin: Negative.   Neurological: Negative.   Endo/Heme/Allergies: Negative.   Psychiatric/Behavioral:  Positive for depression. The patient is nervous/anxious.     Blood pressure 117/65, pulse 96, temperature 98.6 F (37 C), temperature source Oral, resp. rate 17, SpO2 99%, not currently breastfeeding. There is no height or weight on file to calculate BMI.  Past Psychiatric History: MDD, GAD,    Is the patient at risk to self? Yes  Has the patient been a risk to self in the past 6 months? No .    Has the patient  been a risk to self within the distant past? No   Is the patient a risk to others? No   Has the patient been a risk to others in the past 6 months? No   Has the patient been a risk to others within the distant past? No   Past Medical History:  Past Medical History:  Diagnosis Date   Anxiety    Depression    MDD (major depressive disorder), single episode, moderate (HCC)      Family History:  Family History  Problem Relation Age of Onset   Diabetes Mother    Cirrhosis Mother    Diabetes Father    Colon cancer Neg Hx    Esophageal  cancer Neg Hx      Social History:  Social History   Tobacco Use   Smoking status: Never    Passive exposure: Yes   Smokeless tobacco: Never  Vaping Use   Vaping status: Some Days   Substances: Nicotine  Substance Use Topics   Alcohol use: No    Comment: occ   Drug use: Not Currently    Types: Other-see comments, Marijuana     Last Labs:  Admission on 07/23/2023  Component Date Value Ref Range Status   WBC 07/24/2023 11.5 (H)  4.0 - 10.5 K/uL Final   RBC 07/24/2023 4.60  3.87 - 5.11 MIL/uL Final   Hemoglobin 07/24/2023 12.4  12.0 - 15.0 g/dL Final   HCT 81/19/1478 37.3  36.0 - 46.0 % Final   MCV 07/24/2023 81.1  80.0 - 100.0 fL Final   MCH 07/24/2023 27.0  26.0 - 34.0 pg Final   MCHC 07/24/2023 33.2  30.0 - 36.0 g/dL Final   RDW 29/56/2130 16.0 (H)  11.5 - 15.5 % Final   Platelets 07/24/2023 412 (H)  150 - 400 K/uL Final   nRBC 07/24/2023 0.0  0.0 - 0.2 % Final   Neutrophils Relative % 07/24/2023 82  % Final   Neutro Abs 07/24/2023 9.4 (H)  1.7 - 7.7 K/uL Final   Lymphocytes Relative 07/24/2023 13  % Final   Lymphs Abs 07/24/2023 1.5  0.7 - 4.0 K/uL Final   Monocytes Relative 07/24/2023 4  % Final   Monocytes Absolute 07/24/2023 0.5  0.1 - 1.0 K/uL Final   Eosinophils Relative 07/24/2023 1  % Final   Eosinophils Absolute 07/24/2023 0.1  0.0 - 0.5 K/uL Final   Basophils Relative 07/24/2023 0  % Final   Basophils Absolute 07/24/2023 0.0  0.0 - 0.1 K/uL Final   Immature Granulocytes 07/24/2023 0  % Final   Abs Immature Granulocytes 07/24/2023 0.03  0.00 - 0.07 K/uL Final   Performed at [facility], [address] [place], [place] [address]   Sodium 07/24/2023 138  135 - 145 mmol/L Final   Potassium 07/24/2023 4.3  3.5 - 5.1 mmol/L Final   Chloride 07/24/2023 107  98 - 111 mmol/L Final   CO2 07/24/2023 21 (L)  22 - 32 mmol/L Final   Glucose, Bld 07/24/2023 79  70 - 99 mg/dL Final   Glucose reference range applies only to samples taken after fasting for at  least 8 hours.   BUN 07/24/2023 6  6 - 20 mg/dL Final   Creatinine, Ser 07/24/2023 0.60  0.44 - 1.00 mg/dL Final   Calcium 46/96/2952 9.1  8.9 - 10.3 mg/dL Final   Total Protein 84/13/2440 6.9  6.5 - 8.1 g/dL Final   Albumin 03/06/2535 4.1  3.5 - 5.0  g/dL Final   AST 81/19/1478 17  15 - 41 U/L Final   ALT 07/24/2023 16  0 - 44 U/L Final   Alkaline Phosphatase 07/24/2023 55  38 - 126 U/L Final   Total Bilirubin 07/24/2023 0.4  0.0 - 1.2 mg/dL Final   GFR, Estimated 07/24/2023 >60  >60 mL/min Final   Comment: (NOTE) Calculated using the CKD-EPI Creatinine Equation (2021)    Anion gap 07/24/2023 10  5 - 15 Final   Performed at [facility], [address] [place], [place] [address]   Hgb A1c MFr Bld 07/24/2023 4.7 (L)  4.8 - 5.6 % Final   Comment: (NOTE) Pre diabetes:          5.7%-6.4%  Diabetes:              >6.4%  Glycemic control for   <7.0% adults with diabetes    Mean Plasma Glucose 07/24/2023 88.19  mg/dL Final   Performed at [facility], [address], [place], [place] [address]   Alcohol, Ethyl (B) 07/24/2023 <10  <10 mg/dL Final   Comment: (NOTE) Lowest detectable limit for serum alcohol is 10 mg/dL.  For medical purposes only. Performed at [facility], [address] [place], [place] [address]    Cholesterol 07/24/2023 142  0 - 200 mg/dL Final   Triglycerides 96/29/5284 56  <150 mg/dL Final   HDL 13/24/4010 39 (L)  >40 mg/dL Final   Total CHOL/HDL Ratio 07/24/2023 3.6  RATIO Final   VLDL 07/24/2023 11  0 - 40 mg/dL Final   LDL Cholesterol 07/24/2023 92  0 - 99 mg/dL Final   Comment:        Total Cholesterol/HDL:CHD Risk Coronary Heart Disease Risk Table                     Men   Women  1/2 Average Risk   3.4   3.3  Average Risk       5.0   4.4  2 X Average Risk   9.6   7.1  3 X Average Risk  23.4   11.0        Use the calculated Patient Ratio above and the CHD Risk Table to determine the patient's CHD Risk.        ATP III  CLASSIFICATION (LDL):  <100     mg/dL   Optimal  272-536  mg/dL   Near or Above                    Optimal  130-159  mg/dL   Borderline  644-034  mg/dL   High  >742     mg/dL   Very High Performed at [facility], [address] [place], [place] [address]    TSH 07/24/2023 0.898  0.350 - 4.500 uIU/mL Final   Comment: Performed by a 3rd Generation assay with a functional sensitivity of <=0.01 uIU/mL. Performed at [facility], [address], [place], [place] [address]   Admission on 04/30/2023, Discharged on 05/02/2023  Component Date Value Ref Range Status   Lipase 04/30/2023 30  11 - 51 U/L Final   Performed at [facility], [address] [place], [place] [address]   Sodium 04/30/2023 137  135 - 145 mmol/L Final   Potassium 04/30/2023 4.1  3.5 - 5.1 mmol/L Final   Chloride 04/30/2023 106  98 - 111 mmol/L Final  CO2 04/30/2023 22  22 - 32 mmol/L Final   Glucose, Bld 04/30/2023 91  70 - 99 mg/dL Final   Glucose reference range applies only to samples taken after fasting for at least 8 hours.   BUN 04/30/2023 7  6 - 20 mg/dL Final   Creatinine, Ser 04/30/2023 0.52  0.44 - 1.00 mg/dL Final   Calcium 09/81/1914 8.9  8.9 - 10.3 mg/dL Final   Total Protein 78/29/5621 7.6  6.5 - 8.1 g/dL Final   Albumin 30/86/5784 3.8  3.5 - 5.0 g/dL Final   AST 69/62/9528 85 (H)  15 - 41 U/L Final   ALT 04/30/2023 32  0 - 44 U/L Final   Alkaline Phosphatase 04/30/2023 103  38 - 126 U/L Final   Total Bilirubin 04/30/2023 1.0  <1.2 mg/dL Final   GFR, Estimated 04/30/2023 >60  >60 mL/min Final   Comment: (NOTE) Calculated using the CKD-EPI Creatinine Equation (2021)    Anion gap 04/30/2023 9  5 - 15 Final   Performed at [facility], [address] [place], [place] [address]   WBC 04/30/2023 13.5 (H)  4.0 - 10.5 K/uL Final   RBC 04/30/2023 4.83  3.87 - 5.11 MIL/uL Final   Hemoglobin 04/30/2023 12.8  12.0 - 15.0 g/dL Final   HCT 40/02/2724 40.1  36.0 - 46.0 % Final    MCV 04/30/2023 83.0  80.0 - 100.0 fL Final   MCH 04/30/2023 26.5  26.0 - 34.0 pg Final   MCHC 04/30/2023 31.9  30.0 - 36.0 g/dL Final   RDW 36/64/4034 15.5  11.5 - 15.5 % Final   Platelets 04/30/2023 430 (H)  150 - 400 K/uL Final   nRBC 04/30/2023 0.0  0.0 - 0.2 % Final   Performed at [facility], [address] [place], [place] [address]   Color, Urine 04/30/2023 YELLOW (A)  YELLOW Final   APPearance 04/30/2023 HAZY (A)  CLEAR Final   Specific Gravity, Urine 04/30/2023 1.011  1.005 - 1.030 Final   pH 04/30/2023 7.0  5.0 - 8.0 Final   Glucose, UA 04/30/2023 NEGATIVE  NEGATIVE mg/dL Final   Hgb urine dipstick 04/30/2023 LARGE (A)  NEGATIVE Final   Bilirubin Urine 04/30/2023 NEGATIVE  NEGATIVE Final   Ketones, ur 04/30/2023 NEGATIVE  NEGATIVE mg/dL Final   Protein, ur 56/38/7564 NEGATIVE  NEGATIVE mg/dL Final   Nitrite 33/29/5188 NEGATIVE  NEGATIVE Final   Leukocytes,Ua 04/30/2023 MODERATE (A)  NEGATIVE Final   RBC / HPF 04/30/2023 >50  0 - 5 RBC/hpf Final   WBC, UA 04/30/2023 >50  0 - 5 WBC/hpf Final   Bacteria, UA 04/30/2023 RARE (A)  NONE SEEN Final   Squamous Epithelial / HPF 04/30/2023 0-5  0 - 5 /HPF Final   WBC Clumps 04/30/2023 PRESENT   Final   Mucus 04/30/2023 PRESENT   Final   Amorphous Crystal 04/30/2023 PRESENT   Final   Performed at [facility], [address] [place], [place] [address]   Preg Test, Ur 04/30/2023 POSITIVE (A)  NEGATIVE Final   Comment:        THE SENSITIVITY OF THIS METHODOLOGY IS >24 mIU/mL    hCG, Beta Chain, Quant, S 04/30/2023 19 (H)  <5 mIU/mL Final   Comment:          GEST. AGE      CONC.  (mIU/mL)   <=1 WEEK        5 - 50     2 WEEKS  50 - 500     3 WEEKS       100 - 10,000     4 WEEKS     1,000 - 30,000     5 WEEKS     3,500 - 115,000   6-8 WEEKS     12,000 - 270,000    12 WEEKS     15,000 - 220,000        FEMALE AND NON-PREGNANT FEMALE:     LESS THAN 5 mIU/mL Performed at [facility], [address] [place], [place] [address]    SURGICAL PATHOLOGY 05/01/2023    Final-Edited                   Value:SURGICAL PATHOLOGY [facility] [address] [place], [place] [address] Telephone [phone] or [phone] Fax [phone]  REPORT OF SURGICAL PATHOLOGY   Accession #: [id number] Patient Name: SUMI, LYE Visit # : [id number]  MRN: [id number] Physician: Sterling Big DOB/Age December 04, 2000 (Age: 41) Gender: F Collected Date: 05/01/2023 Received Date: 05/02/2023  FINAL DIAGNOSIS       1. 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      - REACTIVE CYSTIC DUCT LYMPH NODE.      - NEGATIVE FOR MALIGNANCY.       DATE SIGNED OUT: 05/03/2023 ELECTRONIC SIGNATURE : Oneita Kras Md, Delice Bison , Pathologist, Electronic Signature  MICROSCOPIC DESCRIPTION  CASE COMMENTS STAINS USED IN DIAGNOSIS: H&E    CLINICAL HISTORY  SPECIMEN(S) OBTAINED 1. Gallbladder,  SPECIMEN COMMENTS: SPECIMEN CLINICAL INFORMATION: 1. Gallstones    Gross Description 1. Size/?Intact: 10.5 x 3.5 2.8                          cm, received intact.      Serosal surface: Tan-green, smooth and glistening with focal edema at the neck.      Mucosa/Wall: The mucosa is tan-green and velvety; the wall is 0.1 cm thick.      Contents: Innumerable bosselated yellow stones (0.1 to 0.4 cm in greatest      dimension) and abdunant thin bile.      Cystic duct: Grossly occluded by the luminal stones; a 0.5 x 0.4 x 0.4 cm      presumed lymph node is identified at the cystic duct.      Block Summary: The cystic duct margin (en face, inked blue), complete lymph      node, and representative cross sections from neck, body, and fundus are      submitted in block 1A.      SMB      05/02/2023        Report signed out from the following location(s) . [facility] [address] [place], [place] [address]  CLIA #: [id number]  [facility] [address] [place], [place] [address] CLIA #: [id number]   Appointment on 04/28/2023  Component Date Value Ref Range Status   Total Bilirubin 04/28/2023 0.4  0.2 - 1.2 mg/dL Final   Bilirubin, Direct 04/28/2023 0.1  0.0 - 0.3 mg/dL Final   Alkaline Phosphatase 04/28/2023 73  39 - 117 U/L Final   AST 04/28/2023 15  0 - 37 U/L Final   ALT 04/28/2023 12  0 - 35 U/L Final   Total Protein 04/28/2023 6.4  6.0 - 8.3 g/dL Final  Albumin 04/28/2023 3.7  3.5 - 5.2 g/dL Final  Admission on 09/81/1914, Discharged on 04/20/2023  Component Date Value Ref Range Status   WBC 04/17/2023 11.1 (H)  4.0 - 10.5 K/uL Final   RBC 04/17/2023 3.95  3.87 - 5.11 MIL/uL Final   Hemoglobin 04/17/2023 10.4 (L)  12.0 - 15.0 g/dL Final   HCT 78/29/5621 32.2 (L)  36.0 - 46.0 % Final   MCV 04/17/2023 81.5  80.0 - 100.0 fL Final   MCH 04/17/2023 26.3  26.0 - 34.0 pg Final   MCHC 04/17/2023 32.3  30.0 - 36.0 g/dL Final   RDW 30/86/5784 14.6  11.5 - 15.5 % Final   Platelets 04/17/2023 362  150 - 400 K/uL Final   nRBC 04/17/2023 0.0  0.0 - 0.2 % Final   Performed at [facility], [address], [place], [place] [address]   ABO/RH(D) 04/17/2023 A NEG   Final   Antibody Screen 04/17/2023 POS   Final   Sample Expiration 04/17/2023 04/20/2023,2359   Final   Antibody Identification 04/17/2023 PASSIVELY ACQUIRED ANTI-D   Final   RPR Ser Ql 04/17/2023 NON REACTIVE  NON REACTIVE Final   Performed at [facility], [address] [place], [place] [address]   Sodium 04/17/2023 136  135 - 145 mmol/L Final   Potassium 04/17/2023 3.3 (L)  3.5 - 5.1 mmol/L Final   Chloride 04/17/2023 106  98 - 111 mmol/L Final   CO2 04/17/2023 19 (L)  22 - 32 mmol/L Final   Glucose, Bld 04/17/2023 114 (H)  70 - 99 mg/dL Final   Glucose reference range applies only to samples taken after fasting for at least 8 hours.   BUN 04/17/2023 <5 (L)  6 - 20 mg/dL Final   Creatinine, Ser  04/17/2023 0.42 (L)  0.44 - 1.00 mg/dL Final   Calcium 32/44/0102 8.9  8.9 - 10.3 mg/dL Final   Total Protein 72/53/6644 6.1 (L)  6.5 - 8.1 g/dL Final   Albumin 03/47/4259 2.7 (L)  3.5 - 5.0 g/dL Final   AST 56/38/7564 29  15 - 41 U/L Final   ALT 04/17/2023 34  0 - 44 U/L Final   Alkaline Phosphatase 04/17/2023 98  38 - 126 U/L Final   Total Bilirubin 04/17/2023 0.5  <1.2 mg/dL Final   GFR, Estimated 04/17/2023 >60  >60 mL/min Final   Comment: (NOTE) Calculated using the CKD-EPI Creatinine Equation (2021)    Anion gap 04/17/2023 11  5 - 15 Final   Performed at [facility], [address], [place], [place] [address]   Glucose-Capillary 04/17/2023 116 (H)  70 - 99 mg/dL Final   Glucose reference range applies only to samples taken after fasting for at least 8 hours.   Glucose-Capillary 04/17/2023 86  70 - 99 mg/dL Final   Glucose reference range applies only to samples taken after fasting for at least 8 hours.   Glucose-Capillary 04/17/2023 73  70 - 99 mg/dL Final   Glucose reference range applies only to samples taken after fasting for at least 8 hours.   Glucose-Capillary 04/17/2023 76  70 - 99 mg/dL Final   Glucose reference range applies only to samples taken after fasting for at least 8 hours.   Comment 1 04/17/2023 Notify RN   Final   Comment 2 04/17/2023 Document in Chart   Final   Glucose-Capillary 04/18/2023 80  70 - 99 mg/dL Final   Glucose reference range applies only to samples  taken after fasting for at least 8 hours.   Comment 1 04/18/2023 Notify RN   Final   Comment 2 04/18/2023 Document in Chart   Final   Glucose-Capillary 04/18/2023 76  70 - 99 mg/dL Final   Glucose reference range applies only to samples taken after fasting for at least 8 hours.   Glucose-Capillary 04/18/2023 97  70 - 99 mg/dL Final   Glucose reference range applies only to samples taken after fasting for at least 8 hours.   WBC 04/19/2023 20.8 (H)  4.0 - 10.5 K/uL Final   RBC 04/19/2023 3.45  (L)  3.87 - 5.11 MIL/uL Final   Hemoglobin 04/19/2023 9.3 (L)  12.0 - 15.0 g/dL Final   HCT 16/02/9603 28.4 (L)  36.0 - 46.0 % Final   MCV 04/19/2023 82.3  80.0 - 100.0 fL Final   MCH 04/19/2023 27.0  26.0 - 34.0 pg Final   MCHC 04/19/2023 32.7  30.0 - 36.0 g/dL Final   RDW 54/01/8118 14.7  11.5 - 15.5 % Final   Platelets 04/19/2023 292  150 - 400 K/uL Final   nRBC 04/19/2023 0.0  0.0 - 0.2 % Final   Performed at [facility], [address], [place], [place] [address]   Gestational Age(Wks) 04/19/2023 38.2   Final   Fetal Screen 04/19/2023 NEG   Final   Unit Number 04/19/2023 [id number]   Final   Blood Component Type 04/19/2023 RHIG   Final   Unit division 04/19/2023 00   Final   Status of Unit 04/19/2023 [facility]   Final   Transfusion Status 04/19/2023    Final                   Value:OK TO TRANSFUSE Performed at [facility], [address], [place], [place] [address]   Routine Prenatal on 04/13/2023  Component Date Value Ref Range Status   Glucose 04/13/2023 66 (L)  70 - 99 mg/dL Final   BUN 95/28/4132 5 (L)  6 - 20 mg/dL Final   Creatinine, Ser 04/13/2023 0.51 (L)  0.57 - 1.00 mg/dL Final   eGFR 44/05/270 136  >59 mL/min/1.73 Final   BUN/Creatinine Ratio 04/13/2023 10  9 - 23 Final   Sodium 04/13/2023 136  134 - 144 mmol/L Final   Potassium 04/13/2023 3.8  3.5 - 5.2 mmol/L Final   Chloride 04/13/2023 102  96 - 106 mmol/L Final   CO2 04/13/2023 20  20 - 29 mmol/L Final   Calcium 04/13/2023 8.5 (L)  8.7 - 10.2 mg/dL Final   Total Protein 53/66/4403 5.9 (L)  6.0 - 8.5 g/dL Final   Albumin 47/42/5956 3.5 (L)  4.0 - 5.0 g/dL Final   Globulin, Total 04/13/2023 2.4  1.5 - 4.5 g/dL Final   Bilirubin Total 04/13/2023 0.3  0.0 - 1.2 mg/dL Final   Alkaline Phosphatase 04/13/2023 123 (H)  44 - 121 IU/L Final   AST 04/13/2023 48 (H)  0 - 40 IU/L Final   ALT 04/13/2023 62 (H)  0 - 32 IU/L Final   Bile Acids Total 04/13/2023 24.1 (HH)  0.0 - 10.0 umol/L Final                      Client Requested Flag  Routine Prenatal on 04/06/2023  Component Date Value Ref Range Status   Neisseria Gonorrhea 04/06/2023 Negative   Final   Chlamydia 04/06/2023 Negative   Final   Comment 04/06/2023 Normal Reference Ranger  Chlamydia - Negative   Final   Comment 04/06/2023 Normal Reference Range Neisseria Gonorrhea - Negative   Final   Strep Gp B NAA 04/06/2023 Negative  Negative Final   Comment: Centers for Disease Control and Prevention (CDC) and American Congress of Obstetricians and Gynecologists (ACOG) guidelines for prevention of perinatal group B streptococcal (GBS) disease specify co-collection of a vaginal and rectal swab specimen to maximize sensitivity of GBS detection. Per the CDC and ACOG, swabbing both the lower vagina and rectum substantially increases the yield of detection compared with sampling the vagina alone. Penicillin G, ampicillin, or cefazolin are indicated for intrapartum prophylaxis of perinatal GBS colonization. Reflex susceptibility testing should be performed prior to use of clindamycin only on GBS isolates from penicillin-allergic women who are considered a high risk for anaphylaxis. Treatment with vancomycin without additional testing is warranted if resistance to clindamycin is noted.    Glucose 04/06/2023 72  70 - 99 mg/dL Final   BUN 16/02/9603 4 (L)  6 - 20 mg/dL Final   Creatinine, Ser 04/06/2023 0.58  0.57 - 1.00 mg/dL Final   eGFR 54/01/8118 132  >59 mL/min/1.73 Final   BUN/Creatinine Ratio 04/06/2023 7 (L)  9 - 23 Final   Sodium 04/06/2023 135  134 - 144 mmol/L Final   Potassium 04/06/2023 3.8  3.5 - 5.2 mmol/L Final   Chloride 04/06/2023 98  96 - 106 mmol/L Final   CO2 04/06/2023 19 (L)  20 - 29 mmol/L Final   Calcium 04/06/2023 9.3  8.7 - 10.2 mg/dL Final   Total Protein 14/78/2956 6.8  6.0 - 8.5 g/dL Final   Albumin 21/30/8657 4.0  4.0 - 5.0 g/dL Final   Globulin, Total 04/06/2023 2.8  1.5 - 4.5 g/dL Final   Bilirubin Total  04/06/2023 0.6  0.0 - 1.2 mg/dL Final   Alkaline Phosphatase 04/06/2023 141 (H)  44 - 121 IU/L Final   AST 04/06/2023 215 (H)  0 - 40 IU/L Final   ALT 04/06/2023 184 (H)  0 - 32 IU/L Final   Hep A IgM 04/06/2023 Negative  Negative Final   Comment: A negative anti-HAV IgM result suggests no recent or current HAV infection.    Hepatitis B Surface Ag 04/06/2023 Negative  Negative Final   Hep B C IgM 04/06/2023 Negative  Negative Final   HCV Ab 04/06/2023 Non Reactive  Non Reactive Final   HCV Interp 1: 04/06/2023 Comment   Final   Comment: Not infected with HCV unless early or acute infection is suspected (which may be delayed in an immunocompromised individual), or other evidence exists to indicate HCV infection.    specimen status report 04/06/2023 Comment   Final   Comment: Written Authorization Written Authorization Written Authorization Received. Authorization received from Scheryl Marten for [employer] on 04-11-2023 Logged by Adria Dill   Admission on 04/04/2023, Discharged on 04/04/2023  Component Date Value Ref Range Status   WBC 04/04/2023 12.4 (H)  4.0 - 10.5 K/uL Final   RBC 04/04/2023 4.42  3.87 - 5.11 MIL/uL Final   Hemoglobin 04/04/2023 11.8 (L)  12.0 - 15.0 g/dL Final   HCT 84/69/6295 35.8 (L)  36.0 - 46.0 % Final   MCV 04/04/2023 81.0  80.0 - 100.0 fL Final   MCH 04/04/2023 26.7  26.0 - 34.0 pg Final   MCHC 04/04/2023 33.0  30.0 - 36.0 g/dL Final   RDW 28/41/3244 14.3  11.5 - 15.5 % Final   Platelets 04/04/2023 391  150 - 400 K/uL  Final   nRBC 04/04/2023 0.0  0.0 - 0.2 % Final   Neutrophils Relative % 04/04/2023 82  % Final   Neutro Abs 04/04/2023 10.1 (H)  1.7 - 7.7 K/uL Final   Lymphocytes Relative 04/04/2023 12  % Final   Lymphs Abs 04/04/2023 1.5  0.7 - 4.0 K/uL Final   Monocytes Relative 04/04/2023 5  % Final   Monocytes Absolute 04/04/2023 0.6  0.1 - 1.0 K/uL Final   Eosinophils Relative 04/04/2023 0  % Final   Eosinophils Absolute 04/04/2023 0.0   0.0 - 0.5 K/uL Final   Basophils Relative 04/04/2023 0  % Final   Basophils Absolute 04/04/2023 0.0  0.0 - 0.1 K/uL Final   Immature Granulocytes 04/04/2023 1  % Final   Abs Immature Granulocytes 04/04/2023 0.11 (H)  0.00 - 0.07 K/uL Final   Performed at [facility], [address] [place], [place] [address]   Sodium 04/04/2023 134 (L)  135 - 145 mmol/L Final   Potassium 04/04/2023 3.1 (L)  3.5 - 5.1 mmol/L Final   Chloride 04/04/2023 103  98 - 111 mmol/L Final   CO2 04/04/2023 17 (L)  22 - 32 mmol/L Final   Glucose, Bld 04/04/2023 92  70 - 99 mg/dL Final   Glucose reference range applies only to samples taken after fasting for at least 8 hours.   BUN 04/04/2023 5 (L)  6 - 20 mg/dL Final   Creatinine, Ser 04/04/2023 0.51  0.44 - 1.00 mg/dL Final   Calcium 60/45/4098 9.2  8.9 - 10.3 mg/dL Final   Total Protein 11/91/4782 6.9  6.5 - 8.1 g/dL Final   Albumin 95/62/1308 3.2 (L)  3.5 - 5.0 g/dL Final   AST 65/78/4696 127 (H)  15 - 41 U/L Final   ALT 04/04/2023 113 (H)  0 - 44 U/L Final   Alkaline Phosphatase 04/04/2023 111  38 - 126 U/L Final   Total Bilirubin 04/04/2023 1.1  <1.2 mg/dL Final   GFR, Estimated 04/04/2023 >60  >60 mL/min Final   Comment: (NOTE) Calculated using the CKD-EPI Creatinine Equation (2021)    Anion gap 04/04/2023 14  5 - 15 Final   Performed at [facility], [address], [place], [place] [address]   Color, Urine 04/04/2023 AMBER (A)  YELLOW Final   BIOCHEMICALS MAY BE AFFECTED BY COLOR   APPearance 04/04/2023 HAZY (A)  CLEAR Final   Specific Gravity, Urine 04/04/2023 1.027  1.005 - 1.030 Final   pH 04/04/2023 5.0  5.0 - 8.0 Final   Glucose, UA 04/04/2023 50 (A)  NEGATIVE mg/dL Final   Hgb urine dipstick 04/04/2023 NEGATIVE  NEGATIVE Final   Bilirubin Urine 04/04/2023 NEGATIVE  NEGATIVE Final   Ketones, ur 04/04/2023 80 (A)  NEGATIVE mg/dL Final   Protein, ur 41/32/4401 100 (A)  NEGATIVE mg/dL Final   Nitrite 02/72/5366 NEGATIVE  NEGATIVE Final    Leukocytes,Ua 04/04/2023 NEGATIVE  NEGATIVE Final   RBC / HPF 04/04/2023 0-5  0 - 5 RBC/hpf Final   WBC, UA 04/04/2023 0-5  0 - 5 WBC/hpf Final   Bacteria, UA 04/04/2023 MANY (A)  NONE SEEN Final   Squamous Epithelial / HPF 04/04/2023 6-10  0 - 5 /HPF Final   Mucus 04/04/2023 PRESENT   Final   Hyaline Casts, UA 04/04/2023 PRESENT   Final   Amorphous Crystal 04/04/2023 PRESENT   Final   Performed at [facility], [address] [place], [place] [address]   TSH  04/04/2023 1.744  0.350 - 4.500 uIU/mL Final   Comment: Performed by a 3rd Generation assay with a functional sensitivity of <=0.01 uIU/mL. Performed at [facility], [address], [place], [place] [address]    Lipase 04/04/2023 22  11 - 51 U/L Final   Performed at [facility], [address], [place], [place] [address]   Glucose-Capillary 04/04/2023 82  70 - 99 mg/dL Final   Glucose reference range applies only to samples taken after fasting for at least 8 hours.   Specimen Description 04/04/2023 URINE, RANDOM   Final   Special Requests 04/04/2023 NONE   Final   Culture 04/04/2023    Final                   Value:NO GROWTH Performed at [facility], [address] [place], [place] [address]    Report Status 04/04/2023 04/06/2023 FINAL   Final  Lab on 02/01/2023  Component Date Value Ref Range Status   WBC 02/01/2023 11.6 (H)  3.4 - 10.8 x10E3/uL Final   RBC 02/01/2023 4.21  3.77 - 5.28 x10E6/uL Final   Hemoglobin 02/01/2023 12.3  11.1 - 15.9 g/dL Final   Hematocrit 95/62/1308 38.2  34.0 - 46.6 % Final   MCV 02/01/2023 91  79 - 97 fL Final   MCH 02/01/2023 29.2  26.6 - 33.0 pg Final   MCHC 02/01/2023 32.2  31.5 - 35.7 g/dL Final   RDW 65/78/4696 13.5  11.7 - 15.4 % Final   Platelets 02/01/2023 419  150 - 450 x10E3/uL Final   Glucose, Fasting 02/01/2023 90  70 - 91 mg/dL Final   Glucose, 1 hour 02/01/2023 180 (H)  70 - 179 mg/dL Final   Glucose, 2 hour 02/01/2023 135  70 - 152 mg/dL Final    HIV Screen 4th Generation wRfx 02/01/2023 Non Reactive  Non Reactive Final   Comment: HIV-1/HIV-2 antibodies and HIV-1 p24 antigen were NOT detected. There is no laboratory evidence of HIV infection. HIV Negative    RPR Ser Ql 02/01/2023 Non Reactive  Non Reactive Final   Antibody Screen 02/01/2023 Negative  Negative Final    Allergies: Patient has no known allergies.  Medications:  Facility Ordered Medications  Medication   acetaminophen (TYLENOL) tablet 650 mg   alum & mag hydroxide-simeth (MAALOX/MYLANTA) 200-200-20 MG/5ML suspension 30 mL   magnesium hydroxide (MILK OF MAGNESIA) suspension 30 mL   haloperidol (HALDOL) tablet 5 mg   And   diphenhydrAMINE (BENADRYL) capsule 50 mg   haloperidol lactate (HALDOL) injection 5 mg   And   diphenhydrAMINE (BENADRYL) injection 50 mg   And   LORazepam (ATIVAN) injection 2 mg   haloperidol lactate (HALDOL) injection 10 mg   And   diphenhydrAMINE (BENADRYL) injection 50 mg   And   LORazepam (ATIVAN) injection 2 mg   hydrOXYzine (ATARAX) tablet 25 mg   traZODone (DESYREL) tablet 50 mg   PTA Medications  Medication Sig   ibuprofen (ADVIL) 800 MG tablet Take 1 tablet (800 mg total) by mouth every 8 (eight) hours as needed.   hydrOXYzine (ATARAX) 25 MG tablet Take 1 tablet (25 mg total) by mouth 3 (three) times daily as needed for anxiety.   senna-docusate (SENOKOT-S) 8.6-50 MG tablet Take 2 tablets by mouth at bedtime as needed for mild constipation.   traZODone (DESYREL) 50 MG tablet Take 0.5 tablets (25 mg total) by mouth at bedtime as needed for sleep (THis is a 25  mg tablet - cut in half the first time you take it, but OK to take a full tablet if needed.). Works best as needed. (Patient not taking: Reported on 06/08/2023)   HYDROcodone-acetaminophen (NORCO/VICODIN) 5-325 MG tablet Take 1-2 tablets by mouth every 4 (four) hours as needed for moderate pain (pain score 4-6).   FLUoxetine (PROZAC) 20 MG capsule Take 1 capsule (20 mg total)  by mouth in the morning.      Medical Decision Making  Patient is recommended for inpatient psychiatric admission for stabilization.   -IVC First exam completed-recommended for inpatient psych -resume Prozac 20mg /day  Recommendations  Based on my evaluation the patient does not appear to have an emergency medical condition.  Maricela Bo, NP 07/24/23  7:37 AM

## 2023-07-24 NOTE — ED Provider Notes (Signed)
 FBC/OBS ASAP Discharge Summary  Date and Time: 07/24/2023 11:19 AM  Name: Julie Rios  MRN:  [id number]   Discharge Diagnoses:  Final diagnoses:  Severe episode of recurrent major depressive disorder, without psychotic features (HCC)    Subjective: Patient states "I lost my mom in Aug 19, 2013, my dad passed away in 08/19/20 and that is when things got really bad, I could not handle it."  Julie Rios reports recent stressors include becoming aware that her significant other has been unfaithful to her for several years including recently, after the birth of their daughter.  Patient states "I feel like it is all my fault, I do not know what I can do."  Additional stressors include care for her newborn.  Patient reports she required cholecystectomy several months ago.  She is currently not breast-feeding.  She states "I wanted to breast-feed but the gallbladder removal tanked it."  Patient assessed by this nurse practitioner face-to-face.  She is reclined in observation area upon my approach, denies physical pain.  She presents with depressed mood, tearful affect.  Patient appears to minimize depressive symptoms.  She states "I know that I need somebody to talk to."  Patient is linked with outpatient psychiatry, sees Dr. at [facility] behavioral health outpatient.  She states "I have been on fluoxetine off and on for several years.  Restarted fluoxetine approximately 3 months ago.  Patient is typically compliant however misses doses "here and there."  She would like to be linked with individual counseling.  Patient endorses 1 previous inpatient psychiatric hospitalization in 08-19-20 after the death of her father.  No family mental health history reported.  Julie Rios denies suicidal and homicidal ideations currently.  She denies nonsuicidal self-harm.  She denies history of suicide attempts however endorses several previous episodes of suicidal ideation.  She endorses hopelessness.  She denies auditory and  visual hallucinations.  There is no evidence of delusional thought content no indication that patient is responding to internal stimuli.  Patient offered support and encouragement.  She agrees with treatment plan to include inpatient psychiatric hospitalization.  Patient reviewed with attending psychiatrist, Dr. Enedina Finner.  Inpatient psychiatric treatment recommended, patient accepted to [facility] behavioral health for inpatient psychiatric admission by Dr. Sherron Flemings.  Patient remains IVC, IVC initiated by patient's significant other.  Stay Summary: HPI: Completed 07/24/2023 0435am: Julie Rios is a 23 y/o female with a history of postpartum depression, major depressive disorder, and generalized anxiety disorder (GAD). Patient presents under Involuntary Commitment due to worsening depressive symptoms and suicidal ideation.   Per IVC petition: Pt was suicidal and cut her wrist with a steak knife. The pt has been diagnoised with anxiety,serve depression, and postpartum depression. Pt is prescribed proxac, atarax, and desyrel. The pt also reported that she is seeing dark figures in hallways and hearing voices. According to the petitoner (pt's boyfriend) pt has made a statement about wanting to leave the world. Pt is self mutilatinf by hitting her head against walls, dressers, doors and head boards. The pt has a hx of committment to [facility] and is a danger to herself."      Patient was evaluated face-to-face and her chart was reviewed by this nurse practitioner.  On assessment, she reports ongoing relationship conflict with her boyfriend; she reports that he repeatedly cheated on her during her pregnancy, and she discovered on 07/20/23 that he is still engaging in infidelity. The patient acknowledges experiencing depressive symptoms but notes that she was able to speak with a friend, which helped  her feel better. She admits to self-harming on 07/20/23 after discovering that her boyfriend was unfaithful. She denies  current suicidal ideation and claims that she is doing well mentally, though she appears to be minimizing her distress. The patient reports no worsening symptoms of depression and states that she has been taking her medication as prescribed.    Patient gave verbal consent for this NP to speak with her boyfriend's mother. Collateral information from the patient's boyfriend's mother, Cyndra Numbers [phone]) suggests significant concerns. The mother reports that the patient is depressed, has not been following up with appointments, and missed her six-week postpartum checkup as well as a surgical follow-up. The boyfriend's mother also describes the patient as expressing hopelessness, with statements such as "my daughter won't miss me or be affected if I'm not here." The patient is no longer engaging in previously enjoyed activities, such as art, and feels worthless, believing she is not adding value to her family. She is described as being constantly anxious, unable to function outside of the home, and refusing to go out in public. The boyfriend's mother is deeply concerned about the patient's mental and emotional health.    Collateral Per IVC petitioner, Casimiro Needle May (pt's boyfriend): Casimiro Needle says he feels patient is a endanger to herself and has not been doing well mentally. He states patient has made several suicidal comments and he is concerned about her mental health and safety.    Patient is alert and orientedx4. Her speech is coherent. Her is depressed, and her affect is restricted. The patient's thought process is logical but marked by some signs of hopelessness and low self-worth.  She denies any delusions or hallucinations. Insight into her depression appears limited, as she is downplaying the severity of her symptoms. Judgment is somewhat impaired, particularly as she is not following up with medical appointments and has been isolating herself. There are no signs of psychosis, but the patient's overall  emotional state, including her isolation and hopelessness, is concerning.  Total Time spent with patient: 30 minutes  Past Psychiatric History: MDD, suicidal ideation, GAD Past Medical History: see above Family History: none reported Family Psychiatric History: none reported Social History: resides in [place] with significant other and his mother Tobacco Cessation:  N/A, patient does not currently use tobacco products  Current Medications:  Current Facility-Administered Medications  Medication Dose Route Frequency Provider Last Rate Last Admin   acetaminophen (TYLENOL) tablet 650 mg  650 mg Oral Q6H PRN Ajibola, Ene A, NP       alum & mag hydroxide-simeth (MAALOX/MYLANTA) 200-200-20 MG/5ML suspension 30 mL  30 mL Oral Q4H PRN Ajibola, Ene A, NP       haloperidol (HALDOL) tablet 5 mg  5 mg Oral TID PRN Ajibola, Ene A, NP       And   diphenhydrAMINE (BENADRYL) capsule 50 mg  50 mg Oral TID PRN Ajibola, Ene A, NP       haloperidol lactate (HALDOL) injection 5 mg  5 mg Intramuscular TID PRN Ajibola, Ene A, NP       And   diphenhydrAMINE (BENADRYL) injection 50 mg  50 mg Intramuscular TID PRN Ajibola, Ene A, NP       And   LORazepam (ATIVAN) injection 2 mg  2 mg Intramuscular TID PRN Ajibola, Ene A, NP       haloperidol lactate (HALDOL) injection 10 mg  10 mg Intramuscular TID PRN Ajibola, Ene A, NP       And   diphenhydrAMINE (  BENADRYL) injection 50 mg  50 mg Intramuscular TID PRN Ajibola, Ene A, NP       And   LORazepam (ATIVAN) injection 2 mg  2 mg Intramuscular TID PRN Ajibola, Ene A, NP       hydrOXYzine (ATARAX) tablet 25 mg  25 mg Oral TID PRN Ajibola, Ene A, NP       magnesium hydroxide (MILK OF MAGNESIA) suspension 30 mL  30 mL Oral Daily PRN Ajibola, Ene A, NP       traZODone (DESYREL) tablet 50 mg  50 mg Oral QHS PRN Ajibola, Ene A, NP       Current Outpatient Medications  Medication Sig Dispense Refill   FLUoxetine (PROZAC) 20 MG capsule Take 1 capsule (20 mg total) by  mouth in the morning. 30 capsule 1   hydrOXYzine (ATARAX) 25 MG tablet Take 1 tablet (25 mg total) by mouth 3 (three) times daily as needed for anxiety. 90 tablet 2    PTA Medications:  Facility Ordered Medications  Medication   acetaminophen (TYLENOL) tablet 650 mg   alum & mag hydroxide-simeth (MAALOX/MYLANTA) 200-200-20 MG/5ML suspension 30 mL   magnesium hydroxide (MILK OF MAGNESIA) suspension 30 mL   haloperidol (HALDOL) tablet 5 mg   And   diphenhydrAMINE (BENADRYL) capsule 50 mg   haloperidol lactate (HALDOL) injection 5 mg   And   diphenhydrAMINE (BENADRYL) injection 50 mg   And   LORazepam (ATIVAN) injection 2 mg   haloperidol lactate (HALDOL) injection 10 mg   And   diphenhydrAMINE (BENADRYL) injection 50 mg   And   LORazepam (ATIVAN) injection 2 mg   hydrOXYzine (ATARAX) tablet 25 mg   traZODone (DESYREL) tablet 50 mg   PTA Medications  Medication Sig   hydrOXYzine (ATARAX) 25 MG tablet Take 1 tablet (25 mg total) by mouth 3 (three) times daily as needed for anxiety.   FLUoxetine (PROZAC) 20 MG capsule Take 1 capsule (20 mg total) by mouth in the morning.       03/07/2023    3:03 PM 02/01/2023    9:08 AM 10/27/2022   10:31 AM  Depression screen PHQ 2/9  Decreased Interest 0 0 0  Down, Depressed, Hopeless 1 2 0  PHQ - 2 Score 1 2 0  Altered sleeping  1 2  Tired, decreased energy  1 2  Change in appetite  2 0  Feeling bad or failure about yourself   3 2  Trouble concentrating  1 0  Moving slowly or fidgety/restless  2 0  Suicidal thoughts  2 0  PHQ-9 Score  14 6  Difficult doing work/chores   Not difficult at all    Flowsheet Row ED from 07/23/2023 in [facility] ED to Hosp-Admission (Discharged) from 04/30/2023 in [facility] Admission (Discharged) from 04/17/2023 in [facility]S Mother Baby Unit  C-SSRS RISK CATEGORY Low Risk No Risk No Risk       Musculoskeletal  Strength & Muscle Tone: within  normal limits Gait & Station: normal Patient leans: N/A  Psychiatric Specialty Exam  Presentation  General Appearance:  Appropriate for Environment; Casual  Eye Contact: Fair  Speech: Clear and Coherent; Normal Rate  Speech Volume: Normal  Handedness: Right   Mood and Affect  Mood: Depressed  Affect: Depressed; Tearful   Thought Process  Thought Processes: Coherent; Goal Directed; Linear  Descriptions of Associations:Intact  Orientation:Full (Time, Place and Person)  Thought Content:Logical; WDL  Diagnosis of Schizophrenia  or Schizoaffective disorder in past: No    Hallucinations:Hallucinations: None  Ideas of Reference:None  Suicidal Thoughts:Suicidal Thoughts: No  Homicidal Thoughts:Homicidal Thoughts: No   Sensorium  Memory: Immediate Good; Recent Good  Judgment: Good  Insight: Good   Executive Functions  Concentration: Good  Attention Span: Good  Recall: Good  Fund of Knowledge: Good  Language: Good   Psychomotor Activity  Psychomotor Activity: Psychomotor Activity: Normal   Assets  Assets: Communication Skills; Desire for Improvement; Financial Resources/Insurance; Housing; Physical Health   Sleep  Sleep: Sleep: Fair Number of Hours of Sleep: 5   Nutritional Assessment (For OBS and FBC admissions only) Has the patient had a weight loss or gain of 10 pounds or more in the last 3 months?: No Has the patient had a decrease in food intake/or appetite?: No Does the patient have dental problems?: No Does the patient have eating habits or behaviors that may be indicators of an eating disorder including binging or inducing vomiting?: No Has the patient recently lost weight without trying?: 0 Has the patient been eating poorly because of a decreased appetite?: 0 Malnutrition Screening Tool Score: 0    Physical Exam  Physical Exam Vitals and nursing note reviewed.  Constitutional:      Appearance: Normal  appearance. She is well-developed.  HENT:     Head: Normocephalic and atraumatic.     Nose: Nose normal.  Cardiovascular:     Rate and Rhythm: Normal rate.  Pulmonary:     Effort: Pulmonary effort is normal.  Musculoskeletal:        General: Normal range of motion.     Cervical back: Normal range of motion.  Skin:    General: Skin is warm and dry.  Neurological:     Mental Status: She is alert and oriented to person, place, and time.  Psychiatric:        Attention and Perception: Attention and perception normal.        Mood and Affect: Mood is depressed. Affect is tearful.        Speech: Speech normal.        Behavior: Behavior normal. Behavior is cooperative.        Thought Content: Thought content normal.        Cognition and Memory: Cognition and memory normal.    Review of Systems  Constitutional: Negative.   HENT: Negative.    Eyes: Negative.   Respiratory: Negative.    Cardiovascular: Negative.   Gastrointestinal: Negative.   Genitourinary: Negative.   Musculoskeletal: Negative.   Skin: Negative.   Neurological: Negative.   Psychiatric/Behavioral:  Positive for depression.    Blood pressure 117/65, pulse 96, temperature 98.6 F (37 C), temperature source Oral, resp. rate 17, SpO2 99%, not currently breastfeeding. There is no height or weight on file to calculate BMI.  Demographic Factors:  Adolescent or young adult, Caucasian, and Unemployed  Loss Factors: NA  Historical Factors: NA  Risk Reduction Factors:   Responsible for children under 24 years of age, Sense of responsibility to family, Living with another person, especially a relative, Positive social support, Positive therapeutic relationship, and Positive coping skills or problem solving skills  Continued Clinical Symptoms:  More than one psychiatric diagnosis  Cognitive Features That Contribute To Risk:  None    Suicide Risk:  Moderate:  Frequent suicidal ideation with limited intensity, and  duration, some specificity in terms of plans, no associated intent, good self-control, limited dysphoria/symptomatology, some risk factors present, and identifiable protective  factors, including available and accessible social support.  Plan Of Care/Follow-up recommendations:    Disposition: Accepted to [facility] for inpatient psychiatric admission by Dr Sherron Flemings. Patient remains IVC.   Medications: Current medications: -Acetaminophen 650 mg every 6 as needed/mild pain -Maalox 30 mL oral every 4 as needed/digestion -Magnesium hydroxide 30 mL daily as needed/mild constipation -Trazodone 50 mg nightly as needed/sleep  Initiated PTA medications: -Hydroxyzine 25 mg 3 times daily as needed/anxiety -FLUoxetine (PROZAC) capsule 20 mg daily/mood      Lenard Lance, FNP 07/24/2023, 11:19 AM

## 2023-07-24 NOTE — Progress Notes (Signed)
 Patient admitted to unit, presents calm and cooperative, with clear speech, able to participate in admission assessment fully. During assessment patient denies SI/ HI, however patient admitted IVC; SI with plan to cut her wrist with steak knife. Patient verbally contracts for safety. Belonging secured. Orientated to unit; interacting with staff and peers appropriately. No distress noted

## 2023-07-24 NOTE — ED Notes (Signed)
 Patient currently asleep. Breathing even and unlabored with even rise and fall of chest. No s/s of current distress.

## 2023-07-24 NOTE — ED Notes (Signed)
 patient reports anxiety after speaking to mother. PRN anxiety medication given. Safety maintained.
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
## 2023-07-24 NOTE — BHH Group Notes (Signed)
 Adult Psychoeducational Group Note  Date:  07/24/2023 Time:  11:35 PM  Group Topic/Focus:  Wrap-Up Group:   The focus of this group is to help patients review their daily goal of treatment and discuss progress on daily workbooks.  Participation Level:  Active  Participation Quality:  Appropriate  Affect:  Appropriate  Cognitive:  Appropriate  Insight: Appropriate  Engagement in Group:  Engaged  Modes of Intervention:  Discussion and Support  Additional Comments:  This group discussed short-term goal planning for the coming week.  Christ Kick 07/24/2023, 11:35 PM

## 2023-07-24 NOTE — Progress Notes (Signed)
   07/24/23 1700  Psych Admission Type (Psych Patients Only)  Admission Status Involuntary  Psychosocial Assessment  Patient Complaints None  Eye Contact Fair  Facial Expression Flat  Affect Flat  Speech Logical/coherent  Interaction Minimal  Motor Activity Slow  Appearance/Hygiene Unremarkable  Behavior Characteristics Anxious;Appropriate to situation  Mood Anxious;Depressed  Thought Process  Coherency WDL  Content WDL  Delusions WDL  Perception WDL  Hallucination None reported or observed  Judgment Poor  Confusion Severe  Danger to Self  Current suicidal ideation? Denies  Agreement Not to Harm Self Yes  Description of Agreement verbal  Danger to Others  Danger to Others None reported or observed

## 2023-07-24 NOTE — Plan of Care (Signed)
   Problem: Education: Goal: Knowledge of [place] [facility] Education information/materials will improve Outcome: Progressing   Problem: Safety: Goal: Periods of time without injury will increase Outcome: Progressing

## 2023-07-24 NOTE — BH Assessment (Signed)
 Gretta Arab, RN , [facility], reports that no appropriate bed at [facility] at this time. Pt will need to be faxed out.

## 2023-07-24 NOTE — Progress Notes (Signed)
 BHH/BMU LCSW Progress Note   07/24/2023    11:56 AM  Julie Rios   [id number]   Type of Contact and Topic:  Psychiatric Bed Placement   Pt accepted to [facility] 303-2     Patient meets inpatient criteria per Cecilio Asper, NP    The attending provider will be Dr. Sherron Flemings   Call report to [phone]    Roseanne Reno, RN @ [facility] notified.     Pt scheduled  to arrive at [facility] TODAY.    Damita Dunnings, MSW, LCSW-A  11:57 AM 07/24/2023

## 2023-07-25 ENCOUNTER — Encounter (HOSPITAL_COMMUNITY): Payer: Self-pay

## 2023-07-25 DIAGNOSIS — F17200 Nicotine dependence, unspecified, uncomplicated: Secondary | ICD-10-CM | POA: Diagnosis present

## 2023-07-25 DIAGNOSIS — F332 Major depressive disorder, recurrent severe without psychotic features: Secondary | ICD-10-CM | POA: Diagnosis not present

## 2023-07-25 LAB — VITAMIN D 25 HYDROXY (VIT D DEFICIENCY, FRACTURES): Vit D, 25-Hydroxy: 17.55 ng/mL — ABNORMAL LOW (ref 30–100)

## 2023-07-25 LAB — FOLATE: Folate: 8.9 ng/mL (ref >5.9)

## 2023-07-25 LAB — PROLACTIN: Prolactin: 10.1 ng/mL (ref 4.8–33.4)

## 2023-07-25 LAB — VITAMIN B12: Vitamin B-12: 178 pg/mL — ABNORMAL LOW (ref 180–914)

## 2023-07-25 MED ORDER — NICOTINE POLACRILEX 2 MG MT GUM: 2.0000 mg | CHEWING_GUM | OROMUCOSAL | Status: DC | PRN

## 2023-07-25 MED ORDER — FLUOXETINE HCL 20 MG PO CAPS
30.0000 mg | ORAL_CAPSULE | Freq: Every day | ORAL | Status: DC
  Filled 2023-07-25 (×2): qty 1

## 2023-07-25 MED ORDER — DIPHENHYDRAMINE HCL 50 MG/ML IJ SOLN: 50.0000 mg | Freq: Three times a day (TID) | INTRAMUSCULAR | Status: DC | PRN

## 2023-07-25 MED ORDER — HALOPERIDOL LACTATE 5 MG/ML IJ SOLN: 5.0000 mg | Freq: Three times a day (TID) | INTRAMUSCULAR | Status: DC | PRN

## 2023-07-25 MED ORDER — LORAZEPAM 2 MG/ML IJ SOLN: 2.0000 mg | Freq: Three times a day (TID) | INTRAMUSCULAR | Status: DC | PRN

## 2023-07-25 NOTE — Progress Notes (Signed)
   07/24/23 2020  Psych Admission Type (Psych Patients Only)  Admission Status Involuntary  Psychosocial Assessment  Patient Complaints None  Eye Contact Fair  Facial Expression Flat  Affect Appropriate to circumstance;Depressed  Speech Logical/coherent  Interaction No initiation  Motor Activity Slow  Appearance/Hygiene Unremarkable  Behavior Characteristics Appropriate to situation  Mood Depressed;Pleasant  Thought Process  Coherency WDL  Content WDL  Delusions None reported or observed  Perception WDL  Hallucination None reported or observed  Judgment Poor  Confusion None  Danger to Self  Current suicidal ideation? Denies  Agreement Not to Harm Self Yes  Description of Agreement verbally  Danger to Others  Danger to Others None reported or observed

## 2023-07-25 NOTE — Plan of Care (Signed)
  Problem: Coping: Goal: Ability to verbalize frustrations and anger appropriately will improve Outcome: Progressing   Problem: Activity: Goal: Interest or engagement in activities will improve Outcome: Progressing   Problem: Safety: Goal: Periods of time without injury will increase Outcome: Progressing

## 2023-07-25 NOTE — Group Note (Signed)
 Therapy Group Note  Group Topic:Other  Group Date: 07/25/2023 Start Time: 1430 End Time: 1500 Facilitators: Ted Mcalpine, OT    The primary objective of this topic is to explore and understand the concept of occupational balance in the context of daily living. The term "occupational balance" is defined broadly, encompassing all activities that occupy an individual's time and energy, including self-care, leisure, and work-related tasks. The goal is to guide participants towards achieving a harmonious blend of these activities, tailored to their personal values and life circumstances. This balance is aimed at enhancing overall well-being, not by equally distributing time across activities, but by ensuring that daily engagements are fulfilling and not draining. The content delves into identifying various barriers that individuals face in achieving occupational balance, such as overcommitment, misaligned priorities, external pressures, and lack of effective time management. The impact of these barriers on occupational performance, roles, and lifestyles is examined, highlighting issues like reduced efficiency, strained relationships, and potential health problems. Strategies for cultivating occupational balance are a key focus. These strategies include practical methods like time blocking, prioritizing tasks, establishing self-care rituals, decluttering, connecting with nature, and engaging in reflective practices. These approaches are designed to be adaptable and applicable to a wide range of life scenarios, promoting a proactive and mindful approach to daily living. The overall aim is to equip participants with the knowledge and tools to create a balanced lifestyle that supports their mental, emotional, and physical health, thereby improving their functional performance in daily life.     Participation Level: Engaged   Participation Quality: Independent   Behavior: Appropriate   Speech/Thought  Process: Relevant   Affect/Mood: Appropriate   Insight: Fair   Judgement: Fair      Modes of Intervention: Education  Patient Response to Interventions:  Attentive   Plan: Continue to engage patient in OT groups 2 - 3x/week.  07/25/2023  Ted Mcalpine, OT  Kerrin Champagne, OT

## 2023-07-25 NOTE — Plan of Care (Signed)
   Problem: Education: Goal: Knowledge of [place] [facility] Education information/materials will improve Outcome: Progressing   Problem: Education: Goal: Verbalization of understanding the information provided will improve Outcome: Progressing
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
## 2023-07-25 NOTE — Progress Notes (Signed)
   07/25/23 0529  15 Minute Checks  Location Bedroom  Visual Appearance Calm  Behavior Sleeping  Sleep (Behavioral Health Patients Only)  Calculate sleep? (Click Yes once per 24 hr at 0600 safety check) Yes  Documented sleep last 24 hours 6.5

## 2023-07-25 NOTE — BH IP Treatment Plan (Unsigned)
 Interdisciplinary Treatment and Diagnostic Plan Update  07/25/2023 Time of Session: 11:31AM Julie Rios MRN: [id number]  Principal Diagnosis: MDD (major depressive disorder), recurrent severe, without psychosis (HCC)  Secondary Diagnoses: Principal Problem:   MDD (major depressive disorder), recurrent severe, without psychosis (HCC) Active Problems:   GAD (generalized anxiety disorder)   Nicotine use disorder   Current Medications:  Current Facility-Administered Medications  Medication Dose Route Frequency Provider Last Rate Last Admin   acetaminophen (TYLENOL) tablet 650 mg  650 mg Oral Q6H PRN Lenard Lance, FNP       alum & mag hydroxide-simeth (MAALOX/MYLANTA) 200-200-20 MG/5ML suspension 30 mL  30 mL Oral Q4H PRN Lenard Lance, FNP       haloperidol lactate (HALDOL) injection 5 mg  5 mg Intramuscular TID PRN Starleen Blue, NP       And   diphenhydrAMINE (BENADRYL) injection 50 mg  50 mg Intramuscular TID PRN Starleen Blue, NP       And   LORazepam (ATIVAN) injection 2 mg  2 mg Intramuscular TID PRN Starleen Blue, NP       [START ON 07/26/2023] FLUoxetine (PROZAC) capsule 30 mg  30 mg Oral Daily Nkwenti, Doris, NP       hydrOXYzine (ATARAX) tablet 25 mg  25 mg Oral TID PRN Lenard Lance, FNP       magnesium hydroxide (MILK OF MAGNESIA) suspension 30 mL  30 mL Oral Daily PRN Lenard Lance, FNP       nicotine polacrilex (NICORETTE) gum 2 mg  2 mg Oral PRN Starleen Blue, NP       traZODone (DESYREL) tablet 50 mg  50 mg Oral QHS PRN Lenard Lance, FNP   50 mg at 07/24/23 2153   PTA Medications: Medications Prior to Admission  Medication Sig Dispense Refill Last Dose/Taking   FLUoxetine (PROZAC) 20 MG capsule Take 1 capsule (20 mg total) by mouth in the morning. 30 capsule 1    hydrOXYzine (ATARAX) 25 MG tablet Take 1 tablet (25 mg total) by mouth 3 (three) times daily as needed for anxiety. 90 tablet 2     Patient Stressors: Marital or family conflict    Patient Strengths:  Physical Health  Supportive family/friends   Treatment Modalities: Medication Management, Group therapy, Case management,  1 to 1 session with clinician, Psychoeducation, Recreational therapy.   Physician Treatment Plan for Primary Diagnosis: MDD (major depressive disorder), recurrent severe, without psychosis (HCC) Long Term Goal(s): Improvement in symptoms so as ready for discharge   Short Term Goals: Ability to identify changes in lifestyle to reduce recurrence of condition will improve Ability to verbalize feelings will improve Ability to disclose and discuss suicidal ideas Ability to demonstrate self-control will improve Ability to identify and develop effective coping behaviors will improve Ability to maintain clinical measurements within normal limits will improve Compliance with prescribed medications will improve  Medication Management: Evaluate patient's response, side effects, and tolerance of medication regimen.  Therapeutic Interventions: 1 to 1 sessions, Unit Group sessions and Medication administration.  Evaluation of Outcomes: Not Progressing  Physician Treatment Plan for Secondary Diagnosis: Principal Problem:   MDD (major depressive disorder), recurrent severe, without psychosis (HCC) Active Problems:   GAD (generalized anxiety disorder)   Nicotine use disorder  Long Term Goal(s): Improvement in symptoms so as ready for discharge   Short Term Goals: Ability to identify changes in lifestyle to reduce recurrence of condition will improve Ability to verbalize feelings will improve Ability to disclose and  discuss suicidal ideas Ability to demonstrate self-control will improve Ability to identify and develop effective coping behaviors will improve Ability to maintain clinical measurements within normal limits will improve Compliance with prescribed medications will improve     Medication Management: Evaluate patient's response, side effects, and tolerance of  medication regimen.  Therapeutic Interventions: 1 to 1 sessions, Unit Group sessions and Medication administration.  Evaluation of Outcomes: Not Progressing   RN Treatment Plan for Primary Diagnosis: MDD (major depressive disorder), recurrent severe, without psychosis (HCC) Long Term Goal(s): Knowledge of disease and therapeutic regimen to maintain health will improve  Short Term Goals: Ability to verbalize frustration and anger appropriately will improve, Ability to demonstrate self-control, Ability to participate in decision making will improve, Ability to verbalize feelings will improve, and Ability to identify and develop effective coping behaviors will improve  Medication Management: RN will administer medications as ordered by provider, will assess and evaluate patient's response and provide education to patient for prescribed medication. RN will report any adverse and/or side effects to prescribing provider.  Therapeutic Interventions: 1 on 1 counseling sessions, Psychoeducation, Medication administration, Evaluate responses to treatment, Monitor vital signs and CBGs as ordered, Perform/monitor CIWA, COWS, AIMS and Fall Risk screenings as ordered, Perform wound care treatments as ordered.  Evaluation of Outcomes: Not Progressing   LCSW Treatment Plan for Primary Diagnosis: MDD (major depressive disorder), recurrent severe, without psychosis (HCC) Long Term Goal(s): Safe transition to appropriate next level of care at discharge, Engage patient in therapeutic group addressing interpersonal concerns.  Short Term Goals: Engage patient in aftercare planning with referrals and resources, Increase ability to appropriately verbalize feelings, Increase emotional regulation, Facilitate acceptance of mental health diagnosis and concerns, and Increase skills for wellness and recovery  Therapeutic Interventions: Assess for all discharge needs, 1 to 1 time with Social worker, Explore available  resources and support systems, Assess for adequacy in community support network, Educate family and significant other(s) on suicide prevention, Complete Psychosocial Assessment, Interpersonal group therapy.  Evaluation of Outcomes: Not Progressing   Progress in Treatment: Attending groups: Yes. Participating in groups: Yes. Taking medication as prescribed: Yes. Toleration medication: Yes. Family/Significant other contact made: No, will contact:  CSW assessment pending Patient understands diagnosis: Yes. Discussing patient identified problems/goals with staff: Yes. Medical problems stabilized or resolved: Yes. Denies suicidal/homicidal ideation: Yes. Issues/concerns per patient self-inventory: No. Other: N/a  New problem(s) identified: No, Describe:  None  New Short Term/Long Term Goal(s): medication stabilization, elimination of SI thoughts, development of comprehensive mental wellness plan.    Patient Goals: "I was just super sad"  Discharge Plan or Barriers: Patient recently admitted. CSW will continue to follow and assess for appropriate referrals and possible discharge planning.   Reason for Continuation of Hospitalization: Anxiety Depression Other; describe Mood stabilization, discharge planning  Estimated Length of Stay: 5-7 DAYS  Last 3 [place] Suicide Severity Risk Score:  Flowsheet Row [facility] (Current[facility]23/2023 [facility] to Hosp-Admission (Discharged) from [facility]/30/2023 [facility]  C-SSRS RISK CATEGORY Low Risk Low Risk No Risk       Last PHQ 2/9 Scores:    03/07/2023    3:03 PM 02/01/2023    9:08 AM 10/27/2022   10:31 AM  Depression screen PHQ 2/9  Decreased Interest 0 0 0  Down, Depressed, Hopeless 1 2 0  PHQ - 2 Score 1 2 0  Altered sleeping  1 2  Tired, decreased energy  1 2  Change in appetite  2 0  Feeling  bad or failure about yourself   3 2  Trouble concentrating  1 0  Moving slowly or fidgety/restless  2 0  Suicidal thoughts  2 0  PHQ-9 Score  14 6  Difficult doing work/chores   Not difficult at all    Scribe for Treatment Team: Jacinta Shoe, LCSW 07/25/2023 5:35 PM

## 2023-07-25 NOTE — [facility] Suicide Risk Assessment (Signed)
 Suicide Risk Assessment  Admission Assessment    [facility] Admission Suicide Risk Assessment   Nursing information obtained from:  Patient Demographic factors:  Adolescent or young adult Current Mental Status:  Suicidal ideation indicated by patient Loss Factors:  NA Historical Factors:  Prior suicide attempts Risk Reduction Factors:  Positive social support  Total Time spent with patient: 1.5 hours Principal Problem: MDD (major depressive disorder), recurrent severe, without psychosis (HCC) Diagnosis:  Principal Problem:   MDD (major depressive disorder), recurrent severe, without psychosis (HCC) Active Problems:   GAD (generalized anxiety disorder)   Nicotine use disorder  Subjective Data: Suicidal ideation and self mutilating behaviors   Continued Clinical Symptoms: Depressive symptoms are persistent, patient continuing to need inpatient hospitalization for treatment and stabilization of her mental status prior to discharge.  Alcohol Use Disorder Identification Test Final Score (AUDIT): 0 The "Alcohol Use Disorders Identification Test", Guidelines for Use in Primary Care, Second Edition.  World Science writer [facility]). Score between 0-7:  no or low risk or alcohol related problems. Score between 8-15:  moderate risk of alcohol related problems. Score between 16-19:  high risk of alcohol related problems. Score 20 or above:  warrants further diagnostic evaluation for alcohol dependence and treatment.   CLINICAL FACTORS:   More than one psychiatric diagnosis Previous Psychiatric Diagnoses and Treatments  Musculoskeletal: Strength & Muscle Tone: within normal limits Gait & Station: normal Patient leans: N/A  Psychiatric Specialty Exam:  Presentation  General Appearance:  Casual  Eye Contact: Fair  Speech: Clear and Coherent  Speech Volume: Normal  Handedness: Right  Mood and Affect  Mood: Anxious; Depressed  Affect: Congruent  Thought Process  Thought  Processes: Coherent  Descriptions of Associations:Intact  Orientation:Full (Time, Place and Person)  Thought Content:WDL  History of Schizophrenia/Schizoaffective disorder:No  Duration of Psychotic Symptoms:No data recorded Hallucinations:Hallucinations: None  Ideas of Reference:None  Suicidal Thoughts:Suicidal Thoughts: No  Homicidal Thoughts:Homicidal Thoughts: No  Sensorium  Memory: Immediate Fair  Judgment: Fair  Insight: Fair  Art therapist  Concentration: Fair  Attention Span: Fair  Recall: [employer] of Knowledge: Fair  Language: Fair  Psychomotor Activity  Psychomotor Activity: Psychomotor Activity: Normal  Assets  Assets: Resilience   Sleep  Sleep: Sleep: Fair  Physical Exam: Physical Exam Constitutional:      Appearance: Normal appearance.  HENT:     Head: Normocephalic.  Eyes:     Pupils: Pupils are equal, round, and reactive to light.  Musculoskeletal:     Cervical back: Normal range of motion.  Neurological:     General: No focal deficit present.     Mental Status: She is alert and oriented to person, place, and time.    Review of Systems  Psychiatric/Behavioral:  Positive for depression, substance abuse and suicidal ideas. Negative for hallucinations and memory loss. The patient is nervous/anxious and has insomnia.   All other systems reviewed and are negative.  Blood pressure 108/71, pulse 84, temperature 97.8 F (36.6 C), temperature source Oral, resp. rate 18, height 5\' 4"  (1.626 m), weight 62 kg, SpO2 99%, not currently breastfeeding. Body mass index is 23.45 kg/m.  COGNITIVE FEATURES THAT CONTRIBUTE TO RISK:  None    SUICIDE RISK:   Severe:  Frequent, intense, and enduring suicidal ideation, specific plan, no subjective intent, but some objective markers of intent (i.e., choice of lethal method), the method is accessible, some limited preparatory behavior, evidence of impaired self-control, severe  dysphoria/symptomatology, multiple risk factors present, and few if any protective  factors, particularly a lack of social support.  PLAN OF CARE: See H & P  I certify that inpatient services furnished can reasonably be expected to improve the patient's condition.   Starleen Blue, NP 07/25/2023, 5:34 PM

## 2023-07-25 NOTE — [facility] Group Notes (Signed)
Patient did not attend the AA group.

## 2023-07-25 NOTE — Progress Notes (Signed)
   07/25/23 0800  Psych Admission Type (Psych Patients Only)  Admission Status Involuntary  Psychosocial Assessment  Patient Complaints None  Eye Contact Fair  Facial Expression Flat  Affect Appropriate to circumstance  Speech Logical/coherent  Interaction Assertive  Motor Activity Slow  Appearance/Hygiene Unremarkable  Behavior Characteristics Appropriate to situation;Cooperative  Mood Pleasant;Sad  Thought Process  Coherency WDL  Content WDL  Delusions None reported or observed  Perception WDL  Hallucination None reported or observed  Judgment Poor  Confusion None  Danger to Self  Current suicidal ideation? Denies  Agreement Not to Harm Self Yes  Description of Agreement verbally  Danger to Others  Danger to Others None reported or observed

## 2023-07-25 NOTE — Group Note (Signed)
 Recreation Therapy Group Note   Group Topic:Stress Management  Group Date: 07/25/2023 Start Time: 0945 End Time: 1000 Facilitators: Julie Rios, LRT,CTRS Location: [address] Dayroom   Group Topic: Stress Management   Goal Area(s) Addresses:  Patient will actively participate in stress management techniques presented during session.  Patient will successfully identify benefit of practicing stress management post d/c.   Intervention: Relaxation exercise with ambient sound and script   Activity: Guided Imagery. LRT provided education, instruction, and demonstration on practice of visualization via guided imagery. Patient was asked to participate in the technique introduced during session. LRT debriefed including topics of mindfulness, stress management and specific scenarios each patient could use these techniques. Patients were given suggestions of ways to access scripts post d/c and encouraged to explore Youtube and other apps available on smartphones, tablets, and computers.  Education:  Stress Management, Discharge Planning.   Education Outcome: Acknowledges education   Affect/Mood: N/A   Participation Level: Did not attend    Clinical Observations/Individualized Feedback:     Plan: Continue to engage patient in RT group sessions 2-3x/week.   Julie Rios, LRT,CTRS 07/25/2023 1:01 PM

## 2023-07-25 NOTE — [facility] Group Notes (Signed)
 Group Goal: Support / Education around grief and loss  Members engage in facilitated group support and psycho-social education.  Group Description: Following introductions and group rules, group members engaged in facilitated group dialog and support around topic of loss, with particular support around experiences of loss in their lives. Group Identified types of loss (relationships / self / things) and identified patterns, circumstances, and changes that precipitate losses. Reflected on thoughts / feelings around loss, normalized grief responses, and recognized variety in grief experience. Group noted Worden's four tasks of grief in discussion.  Group drew on Adlerian / Rogerian, narrative, MI, and group theory of [place] Group.  Patient Progress: Julie Rios was present for the full group. Participation was relatively passive but engaged.  Casper Pagliuca L. Sophronia Simas, M.Div [phone]

## 2023-07-26 ENCOUNTER — Encounter (HOSPITAL_COMMUNITY): Payer: Self-pay | Admitting: Family

## 2023-07-26 DIAGNOSIS — F332 Major depressive disorder, recurrent severe without psychotic features: Secondary | ICD-10-CM | POA: Diagnosis not present

## 2023-07-26 DIAGNOSIS — E538 Deficiency of other specified B group vitamins: Secondary | ICD-10-CM

## 2023-07-26 DIAGNOSIS — E559 Vitamin D deficiency, unspecified: Secondary | ICD-10-CM

## 2023-07-26 HISTORY — DX: Deficiency of other specified B group vitamins: E53.8

## 2023-07-26 HISTORY — DX: Vitamin D deficiency, unspecified: E55.9

## 2023-07-26 MED ORDER — FLUOXETINE HCL 20 MG PO CAPS
40.0000 mg | ORAL_CAPSULE | Freq: Every day | ORAL | Status: DC
  Administered 2023-07-28: 40 mg via ORAL
  Filled 2023-07-26 (×3): qty 2

## 2023-07-26 MED ORDER — FLUOXETINE HCL 10 MG PO CAPS
30.0000 mg | ORAL_CAPSULE | Freq: Every day | ORAL | Status: AC
  Administered 2023-07-27: 30 mg via ORAL
  Filled 2023-07-26: qty 3

## 2023-07-26 MED ORDER — VITAMIN B-12 1000 MCG PO TABS
1000.0000 ug | ORAL_TABLET | Freq: Every day | ORAL | Status: DC
  Administered 2023-07-26 – 2023-07-28 (×3): 1000 ug via ORAL
  Filled 2023-07-26 (×5): qty 1

## 2023-07-26 MED ORDER — VITAMIN D3 25 MCG PO TABS
2000.0000 [IU] | ORAL_TABLET | Freq: Two times a day (BID) | ORAL | Status: DC
  Administered 2023-07-27 – 2023-07-28 (×3): 2000 [IU] via ORAL
  Filled 2023-07-26 (×7): qty 2

## 2023-07-26 MED ORDER — FLUOXETINE HCL 10 MG PO CAPS
30.0000 mg | ORAL_CAPSULE | Freq: Every day | ORAL | Status: DC
  Administered 2023-07-26: 30 mg via ORAL
  Filled 2023-07-26 (×3): qty 3

## 2023-07-26 MED ORDER — VITAMIN D (ERGOCALCIFEROL) 1.25 MG (50000 UNIT) PO CAPS
50000.0000 [IU] | ORAL_CAPSULE | ORAL | Status: DC
  Administered 2023-07-26: 50000 [IU] via ORAL
  Filled 2023-07-26: qty 1

## 2023-07-26 NOTE — Group Note (Signed)
 Recreation Therapy Group Note   Group Topic:Animal Assisted Therapy   Group Date: 07/26/2023 Start Time: 0945 End Time: 1030 Facilitators: Laportia Carley-McCall, LRT,CTRS Location: [address] Dayroom   Animal-Assisted Activity (AAA) Program Checklist/Progress Notes Patient Eligibility Criteria Checklist & Daily Group note for Rec Tx Intervention  AAA/T Program Assumption of Risk Form signed by Patient/ or Parent Legal Guardian Yes  Patient is free of allergies or severe asthma Yes  Patient reports no fear of animals Yes  Patient reports no history of cruelty to animals Yes  Patient understands his/her participation is voluntary Yes  Patient washes hands before animal contact Yes  Patient washes hands after animal contact Yes  Education: Hand Washing, Appropriate Animal Interaction   Education Outcome: Acknowledges education.    Affect/Mood: Appropriate   Participation Level: Engaged   Participation Quality: Independent   Behavior: Appropriate   Speech/Thought Process: Focused   Insight: Good   Judgement: Good   Modes of Intervention: Teaching laboratory technician   Patient Response to Interventions:  Engaged   Education Outcome:  In group clarification offered    Clinical Observations/Individualized Feedback: Patient attended session and interacted appropriately with therapy dog and peers. Patient asked appropriate questions about therapy dog and his training. Patient shared stories about their pets at home with group.      Plan: Continue to engage patient in RT group sessions 2-3x/week.   Julie Rios, LRT,CTRS 07/26/2023 1:50 PM

## 2023-07-26 NOTE — Plan of Care (Signed)
   Problem: Education: Goal: Emotional status will improve Outcome: Progressing Goal: Mental status will improve Outcome: Progressing   Problem: Activity: Goal: Interest or engagement in activities will improve Outcome: Progressing Goal: Sleeping patterns will improve Outcome: Progressing

## 2023-07-26 NOTE — Plan of Care (Signed)
   Problem: Activity: Goal: Interest or engagement in activities will improve Outcome: Progressing   Problem: Coping: Goal: Ability to verbalize frustrations and anger appropriately will improve Outcome: Progressing   Problem: Safety: Goal: Periods of time without injury will increase Outcome: Progressing

## 2023-07-26 NOTE — Progress Notes (Signed)
   07/26/23 2045  Psych Admission Type (Psych Patients Only)  Admission Status Involuntary  Psychosocial Assessment  Patient Complaints None  Eye Contact Fair  Facial Expression Anxious  Affect Appropriate to circumstance  Speech Logical/coherent  Interaction Assertive  Motor Activity Slow  Appearance/Hygiene Unremarkable  Behavior Characteristics Cooperative  Mood Pleasant  Aggressive Behavior  Effect No apparent injury  Thought Process  Coherency WDL  Content WDL  Delusions WDL  Perception WDL  Hallucination None reported or observed  Judgment Impaired  Confusion WDL  Danger to Self  Current suicidal ideation? Denies  Danger to Others  Danger to Others None reported or observed

## 2023-07-26 NOTE — [facility] Group Notes (Signed)
 [facility] Group Notes:  (Nursing/MHT/Case Management/Adjunct)  Date:  07/26/2023  Time:  10:57 PM  Type of Therapy:  Psychoeducational Skills  Participation Level:  Active  Participation Quality:  Appropriate  Affect:  Appropriate  Cognitive:  Appropriate  Insight:  Improving  Engagement in Group:  Developing/Improving  Modes of Intervention:  Education  Summary of Progress/Problems: Patient rated her day as an 8 out of 10 since she kept herself busy throughout the day. She went outside for recreation and colored to pass the time. Her goal for tomorrow is to "stay positive" and get more involved on the hallway.   Hazle Coca S 07/26/2023, 10:57 PM

## 2023-07-26 NOTE — Progress Notes (Signed)
   07/26/23 0115  Psych Admission Type (Psych Patients Only)  Admission Status Involuntary  Psychosocial Assessment  Patient Complaints Worrying  Eye Contact Fair  Facial Expression Anxious  Affect Appropriate to circumstance  Speech Logical/coherent  Interaction Assertive  Motor Activity Slow  Appearance/Hygiene Unremarkable  Behavior Characteristics Cooperative  Mood Pleasant  Aggressive Behavior  Effect No apparent injury  Thought Process  Coherency WDL  Content WDL  Delusions WDL  Perception WDL  Hallucination None reported or observed  Judgment Impaired  Confusion WDL  Danger to Self  Current suicidal ideation? Denies  Danger to Others  Danger to Others None reported or observed

## 2023-07-26 NOTE — Progress Notes (Signed)
   07/26/23 0800  Psych Admission Type (Psych Patients Only)  Admission Status Involuntary  Psychosocial Assessment  Patient Complaints None  Eye Contact Fair  Facial Expression Other (Comment) (Bright)  Affect Appropriate to circumstance  Speech Logical/coherent  Interaction Assertive  Motor Activity Slow  Appearance/Hygiene Unremarkable  Behavior Characteristics Cooperative;Appropriate to situation  Mood Pleasant  Aggressive Behavior  Effect No apparent injury  Thought Process  Coherency WDL  Content WDL  Delusions WDL  Perception WDL  Hallucination None reported or observed  Judgment Impaired  Confusion WDL  Danger to Self  Current suicidal ideation? Denies  Agreement Not to Harm Self Yes  Description of Agreement verbally  Danger to Others  Danger to Others None reported or observed

## 2023-07-26 NOTE — Group Note (Signed)
 LCSW Group Therapy Note   Group Date: 07/26/2023 Start Time: 1100 End Time: 1200   Participation:  patient was present  Type of Therapy:  Group Therapy   Topic:  Money Matters: Ecologist, Confidence and Peace of Mind  Objective: To help participants understand the impact of financial stability on well-being through the lens of Maslow's Hierarchy of Needs and develop practical strategies for budgeting, saving, and debt repayment.  Goals: Increase awareness of spending habits and financial priorities, recognizing how money supports basic needs, security, and relationships. Develop simple budgeting and saving strategies to enhance stability and peace of mind.  Reduce financial stress by creating a realistic debt repayment plan, supporting long-term confidence and well-being.  Summary:  Participants explored how financial stability connects to basic needs, relationships, and self-esteem using Maslow's Hierarchy. They discussed budgeting, saving, and debt repayment strategies, identifying small, manageable changes. Through interactive discussion and self-reflection, they gained insight into their financial habits and created personal action steps for improvement.  Therapeutic Modalities Used: Elements of Cognitive Behavioral Therapy (CBT) - Addressing financial stress and thought patterns. Psychoeducation - Engineer, agricultural. Elements of Motivational Interviewing (MI) - Encouraging realistic, achievable changes. Group Support - Reducing shame and stress through shared experiences.   Alla Feeling, LCSWA 07/26/2023  6:02 PM

## 2023-07-26 NOTE — Progress Notes (Signed)
 [facility] MD Progress Note  07/26/2023 2:06 PM Julie Rios  MRN:  [id number]  Principal Problem: MDD (major depressive disorder), recurrent severe, without psychosis (HCC) Diagnosis: Principal Problem:   MDD (major depressive disorder), recurrent severe, without psychosis (HCC) Active Problems:   GAD (generalized anxiety disorder)   Nicotine use disorder  HPI: Julie Rios is a 23 y.o. female with prior mental health history of GAD, & postpartum depression who initially presented to the [facility] on 03/15 with suicidal ideations.  Patient also made a superficial cut to her left wrist with a steak knife.  Patient was transferred and admitted to this [facility] under IVC status on 03/16.   Patient assessment: On assessment today, the pt reports that their mood is much improved as compared to yesterday. Reports that anxiety is also significantly lower today as compared to yesterday.  Patient reports that her boyfriend visited last night, and they talked about ways to improve upon the relationship.  She reports that boyfriend stated that he is looking into ways to get therapy for himself, regarding his sex addiction.  Patient verbalizes willingness to work on the relationship, but acknowledges that she has come to the mindset that if the relationship does not work out, it is okay.  Empathy and active listening provided. Sleep is improved, and getting better. Appetite is good, Concentration is good. Energy level is fair. Denies suicidal thoughts.  Denies suicidal intent and plan.  Denies having any HI.  Denies having psychotic symptoms.   Denies having side effects to current psychiatric medications.   We discussed keeping medications same, since her Prozac was increased, and today is the first day that she is taking the 30 mg.  We discussed goal for Prozac to be at 40 mg at time of discharge, to which patient is receptive.  Agreeable to starting the 40 mg dose on 03/20, with a  tentative plan to discharge her on 03/21 if she continues to improve and if outpatient follow-up appointments are completed as well as safety planning completed with her family. Patient reports missing her 74 month old baby, but states that she is in good hands with her family members.  Total Time spent with patient: 45 minutes  Past Psychiatric History: See H & P  Past Medical History:  Past Medical History:  Diagnosis Date   Anxiety    B12 deficiency 07/26/2023   Depression    MDD (major depressive disorder), single episode, moderate (HCC)    Vitamin D deficiency 07/26/2023    Past Surgical History:  Procedure Laterality Date   NO PAST SURGERIES     Family History:  Family History  Problem Relation Age of Onset   Diabetes Mother    Cirrhosis Mother    Diabetes Father    Colon cancer Neg Hx    Esophageal cancer Neg Hx    Family Psychiatric  History: See H & P Social History:  Social History   Substance and Sexual Activity  Alcohol Use No   Comment: occ     Social History   Substance and Sexual Activity  Drug Use Not Currently   Types: Other-see comments, Marijuana    Social History   Socioeconomic History   Marital status: Single    Spouse name: Casimiro Needle   Number of children: Not on file   Years of education: Not on file   Highest education level: Not on file  Occupational History   Not on file  Tobacco Use   Smoking  status: Never    Passive exposure: Yes   Smokeless tobacco: Never  Vaping Use   Vaping status: Some Days   Substances: Nicotine  Substance and Sexual Activity   Alcohol use: No    Comment: occ   Drug use: Not Currently    Types: Other-see comments, Marijuana   Sexual activity: Yes    Birth control/protection: None  Other Topics Concern   Not on file  Social History Narrative   Not on file   Social Drivers of Health   Financial Resource Strain: Not on file  Food Insecurity: No Food Insecurity (07/24/2023)   Hunger Vital Sign     Worried About Running Out of Food in the Last Year: Never true    Ran Out of Food in the Last Year: Never true  Transportation Needs: No Transportation Needs (07/24/2023)   PRAPARE - Administrator, Civil Service (Medical): No    Lack of Transportation (Non-Medical): No  Physical Activity: Not on file  Stress: Not on file  Social Connections: Unknown (09/22/2021)   [facility]   Social Network    Social Network: Not on file   Sleep: Good  Appetite:  Fair  Current Medications: Current Facility-Administered Medications  Medication Dose Route Frequency Provider Last Rate Last Admin   acetaminophen (TYLENOL) tablet 650 mg  650 mg Oral Q6H PRN Lenard Lance, FNP   650 mg at 07/25/23 2204   alum & mag hydroxide-simeth (MAALOX/MYLANTA) 200-200-20 MG/5ML suspension 30 mL  30 mL Oral Q4H PRN Lenard Lance, FNP       cyanocobalamin (VITAMIN B12) tablet 1,000 mcg  1,000 mcg Oral Daily Golda Acre, MD   1,000 mcg at 07/26/23 1610   haloperidol lactate (HALDOL) injection 5 mg  5 mg Intramuscular TID PRN Starleen Blue, NP       And   diphenhydrAMINE (BENADRYL) injection 50 mg  50 mg Intramuscular TID PRN Starleen Blue, NP       And   LORazepam (ATIVAN) injection 2 mg  2 mg Intramuscular TID PRN Starleen Blue, NP       Melene Muller ON 07/27/2023] FLUoxetine (PROZAC) capsule 30 mg  30 mg Oral Daily Kameron Blethen, NP       Followed by   Melene Muller ON 07/28/2023] FLUoxetine (PROZAC) capsule 40 mg  40 mg Oral Daily Elwyn Klosinski, Tyler Aas, NP       hydrOXYzine (ATARAX) tablet 25 mg  25 mg Oral TID PRN Lenard Lance, FNP   25 mg at 07/26/23 0806   magnesium hydroxide (MILK OF MAGNESIA) suspension 30 mL  30 mL Oral Daily PRN Lenard Lance, FNP       nicotine polacrilex (NICORETTE) gum 2 mg  2 mg Oral PRN Starleen Blue, NP       traZODone (DESYREL) tablet 50 mg  50 mg Oral QHS PRN Lenard Lance, FNP   50 mg at 07/25/23 2204   Vitamin D (Ergocalciferol) (DRISDOL) 1.25 MG  (50000 UNIT) capsule 50,000 Units  50,000 Units Oral Q7 days Golda Acre, MD   50,000 Units at 07/26/23 0949   [START ON 07/27/2023] vitamin D3 (CHOLECALCIFEROL) tablet 2,000 Units  2,000 Units Oral BID Golda Acre, MD        Lab Results:  Results for orders placed or performed during the hospital encounter of 07/24/23 (from the past 48 hours)  Folate     Status: None   Collection Time: 07/25/23  6:25  PM  Result Value Ref Range   Folate 8.9 >5.9 ng/mL    Comment: Performed at [facility], [address] [place], [place] [address]  Vitamin B12     Status: Abnormal   Collection Time: 07/25/23  6:25 PM  Result Value Ref Range   Vitamin B-12 178 (L) 180 - 914 pg/mL    Comment: (NOTE) This assay is not validated for testing neonatal or myeloproliferative syndrome specimens for Vitamin B12 levels. Performed at [facility], [address], [place], [place] [address]   VITAMIN D 25 Hydroxy (Vit-D Deficiency, Fractures)     Status: Abnormal   Collection Time: 07/25/23  6:25 PM  Result Value Ref Range   Vit D, 25-Hydroxy 17.55 (L) 30 - 100 ng/mL    Comment: (NOTE) Vitamin D deficiency has been defined by the Institute of Medicine  and an Endocrine Society practice guideline as a level of serum 25-OH  vitamin D less than 20 ng/mL (1,2). The Endocrine Society went on to  further define vitamin D insufficiency as a level between 21 and 29  ng/mL (2).  1. IOM (Institute of Medicine). 2010. Dietary reference intakes for  calcium and D. [place]: [employer]. 2. Holick MF, Bink[place], Bischoff-Ferrari HA, et al. Evaluation,  treatment, and prevention of vitamin D deficiency: an Endocrine  Society clinical practice guideline, JCEM. 2011 Jul; 96(7): 1911-30.  Performed at [facility], [address], [place], [place] [address]     Blood Alcohol level:  Lab Results  Component Value Date   [facility] <10 07/24/2023   ETH  <10 12/23/2022    Metabolic Disorder Labs: Lab Results  Component Value Date   HGBA1C 4.7 (L) 07/24/2023   MPG 88.19 07/24/2023   MPG 99.67 04/17/2021   Lab Results  Component Value Date   PROLACTIN 10.1 07/24/2023   Lab Results  Component Value Date   CHOL 142 07/24/2023   TRIG 56 07/24/2023   HDL 39 (L) 07/24/2023   CHOLHDL 3.6 07/24/2023   VLDL 11 07/24/2023   LDLCALC 92 07/24/2023   LDLCALC 98 04/17/2021    Physical Findings: AIMS:  , ,  ,  ,    CIWA:    COWS:     Musculoskeletal: Strength & Muscle Tone: within normal limits Gait & Station: normal Patient leans: N/A  Psychiatric Specialty Exam:  Presentation  General Appearance:  Appropriate for Environment  Eye Contact: Fair  Speech: Clear and Coherent  Speech Volume: Normal  Handedness: Right   Mood and Affect  Mood: Euthymic  Affect: Congruent   Thought Process  Thought Processes: Coherent  Descriptions of Associations:Intact  Orientation:Full (Time, Place and Person)  Thought Content:Logical  History of Schizophrenia/Schizoaffective disorder:No  Duration of Psychotic Symptoms:No data recorded Hallucinations:Hallucinations: None  Ideas of Reference:None  Suicidal Thoughts:Suicidal Thoughts: No  Homicidal Thoughts:Homicidal Thoughts: No   Sensorium  Memory: Immediate Good  Judgment: Good  Insight: Good   Executive Functions  Concentration: Good  Attention Span: Good  Recall: Good  Fund of Knowledge: Good  Language: Good   Psychomotor Activity  Psychomotor Activity: Psychomotor Activity: Normal   Assets  Assets: Communication Skills; Resilience   Sleep  Sleep: Sleep: Good    Physical Exam: Physical Exam Vitals and nursing note reviewed.  HENT:     Head: Normocephalic.     Nose: Nose normal.  Eyes:     Pupils: Pupils are equal, round, and reactive to light.  Neurological:     General: No focal deficit present.     Mental  Status: She is oriented to person, place, and time.    Review of Systems  Psychiatric/Behavioral:  Positive for depression and substance abuse. Negative for hallucinations, memory loss and suicidal ideas. The patient is nervous/anxious and has insomnia.   All other systems reviewed and are negative.  Blood pressure 110/81, pulse 98, temperature 97.7 F (36.5 C), temperature source Oral, resp. rate 18, height 5\' 4"  (1.626 m), weight 62 kg, SpO2 98%, not currently breastfeeding. Body mass index is 23.45 kg/m.  Treatment Plan Summary: Daily contact with patient to assess and evaluate symptoms and progress in treatment and Medication management   Safety and Monitoring: Voluntary admission to inpatient psychiatric unit for safety, stabilization and treatment Daily contact with patient to assess and evaluate symptoms and progress in treatment Patient's case to be discussed in multi-disciplinary team meeting Observation Level : q15 minute checks Vital signs: q12 hours Precautions: Safety   Long Term Goal(s): Improvement in symptoms so as ready for discharge   Short Term Goals: Ability to identify changes in lifestyle to reduce recurrence of condition will improve, Ability to verbalize feelings will improve, Ability to disclose and discuss suicidal ideas, Ability to demonstrate self-control will improve, Ability to identify and develop effective coping behaviors will improve, Ability to maintain clinical measurements within normal limits will improve, and Compliance with prescribed medications will improve   Diagnoses Principal Problem:   MDD (major depressive disorder), recurrent severe, without psychosis (HCC) Active Problems:   GAD (generalized anxiety disorder)   Nicotine use disorder   Medications: -Continue Prozac 30 mg & increase to 40 mg/day on 03/20 starting for MDD and GAD  -Start vitamin D 50.000 units weekly -Start vitamin D 12 daily 1000 mcg x 30 days    PRNS -Continue  agitation protocol medications as needed-See MAR -Continue Nicorette gum as needed for nicotine dependence -Continue Trazodone 50 nightly as needed for sleep -Continue hydroxyzine 25 mg 3 times daily as needed -Continue Tylenol 650 mg every 6 hours PRN for mild pain -Continue Maalox 30 mg every 4 hrs PRN for indigestion -Continue Milk of Magnesia as needed every 6 hrs for constipation   Labs reviewed: Vit D low, supplementing. B12 slightly low, supplementing.   Discharge Planning: Social work and case management to assist with discharge planning and identification of hospital follow-up needs prior to discharge Estimated LOS: 5-7 days Discharge Concerns: Need to establish a safety plan; Medication compliance and effectiveness Discharge Goals: Return home with outpatient referrals for mental health follow-up including medication management/psychotherapy   I certify that inpatient services furnished can reasonably be expected to improve the patient's condition.    Starleen Blue, NP 07/26/2023, 2:06 PM

## 2023-07-27 DIAGNOSIS — F332 Major depressive disorder, recurrent severe without psychotic features: Secondary | ICD-10-CM | POA: Diagnosis not present

## 2023-07-27 NOTE — Group Note (Signed)
 Date:  07/27/2023 Time:  9:49 AM  Group Topic/Focus:  Goals Group:   The focus of this group is to help patients establish daily goals to achieve during treatment and discuss how the patient can incorporate goal setting into their daily lives to aide in recovery. Orientation:   The focus of this group is to educate the patient on the purpose and policies of crisis stabilization and provide a format to answer questions about their admission.  The group details unit policies and expectations of patients while admitted.    Participation Level:  Active  Participation Quality:  Appropriate  Affect:  Appropriate  Cognitive:  Appropriate  Insight: Appropriate  Engagement in Group:  Engaged  Modes of Intervention:  Discussion  Additional Comments:    Marty Uy D Amen Dargis 07/27/2023, 9:49 AM
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
## 2023-07-27 NOTE — Plan of Care (Signed)
   Problem: Education: Goal: Emotional status will improve Outcome: Progressing Goal: Mental status will improve Outcome: Progressing   Problem: Safety: Goal: Periods of time without injury will increase Outcome: Progressing

## 2023-07-27 NOTE — Progress Notes (Signed)
 [facility] MD Progress Note  07/27/2023 2:08 PM Julie Rios  MRN:  [id number] Principal Problem: MDD (major depressive disorder), recurrent severe, without psychosis (HCC) Diagnosis: Principal Problem:   MDD (major depressive disorder), recurrent severe, without psychosis (HCC) Active Problems:   GAD (generalized anxiety disorder)   Nicotine use disorder   ID & Admission Information: Julie Rios is an 23 y.o. female who  has a past medical history of Anxiety, B12 deficiency (07/26/2023), Depression, MDD (major depressive disorder), single episode, moderate (HCC), and Vitamin D deficiency (07/26/2023).  She presented on 07/24/2023  4:53 PM for MDD (major depressive disorder), recurrent severe, without psychosis (HCC).    Subjective:   Case was discussed in the multidisciplinary team. MAR was reviewed and patient was compliant with medications.   Josetta was seen in her room during rounds.  She reported that her mood was "good" today.  She denies any new psychiatric or medical complaints.  She reports that her appetite is good.  She reports that focus and concentration are adequate.  She denies issues with energy.  She reports adequate sleep.  She denies any medication side effects.  She denies suicidal ideations, homicidal ideations, auditory hallucinations, visual hallucinations, or delusions.  If she continues to improve we will plan on discharging tomorrow (Thursday).    Past Psychiatric and Medical Medical History:  Past Medical History:  Diagnosis Date   Anxiety    B12 deficiency 07/26/2023   Depression    MDD (major depressive disorder), single episode, moderate (HCC)    Vitamin D deficiency 07/26/2023    Past Surgical History:  Procedure Laterality Date   NO PAST SURGERIES      Family History(Medical and Psychiatric):  Family History  Problem Relation Age of Onset   Diabetes Mother    Cirrhosis Mother    Diabetes Father    Colon cancer Neg Hx    Esophageal cancer Neg Hx         Social History:  Social History   Substance and Sexual Activity  Alcohol Use No   Comment: occ     Social History   Substance and Sexual Activity  Drug Use Not Currently   Types: Other-see comments, Marijuana    Social History   Socioeconomic History   Marital status: Single    Spouse name: Casimiro Needle   Number of children: Not on file   Years of education: Not on file   Highest education level: Not on file  Occupational History   Not on file  Tobacco Use   Smoking status: Never    Passive exposure: Yes   Smokeless tobacco: Never  Vaping Use   Vaping status: Some Days   Substances: Nicotine  Substance and Sexual Activity   Alcohol use: No    Comment: occ   Drug use: Not Currently    Types: Other-see comments, Marijuana   Sexual activity: Yes    Birth control/protection: None  Other Topics Concern   Not on file  Social History Narrative   Not on file   Social Drivers of Health   Financial Resource Strain: Not on file  Food Insecurity: No Food Insecurity (07/24/2023)   Hunger Vital Sign    Worried About Running Out of Food in the Last Year: Never true    Ran Out of Food in the Last Year: Never true  Transportation Needs: No Transportation Needs (07/24/2023)   PRAPARE - Administrator, Civil Service (Medical): No    Lack of Transportation (Non-Medical): No  Physical Activity: Not on file  Stress: Not on file  Social Connections: Unknown (09/22/2021)   Received from [facility]   Social Network    Social Network: Not on file        Current Medications: Current Facility-Administered Medications  Medication Dose Route Frequency Provider Last Rate Last Admin   acetaminophen (TYLENOL) tablet 650 mg  650 mg Oral Q6H PRN Lenard Lance, FNP   650 mg at 07/25/23 2204   alum & mag hydroxide-simeth (MAALOX/MYLANTA) 200-200-20 MG/5ML suspension 30 mL  30 mL Oral Q4H PRN Lenard Lance, FNP       cyanocobalamin (VITAMIN B12) tablet  1,000 mcg  1,000 mcg Oral Daily Golda Acre, MD   1,000 mcg at 07/27/23 1610   haloperidol lactate (HALDOL) injection 5 mg  5 mg Intramuscular TID PRN Starleen Blue, NP       And   diphenhydrAMINE (BENADRYL) injection 50 mg  50 mg Intramuscular TID PRN Starleen Blue, NP       And   LORazepam (ATIVAN) injection 2 mg  2 mg Intramuscular TID PRN Starleen Blue, NP       Melene Muller ON 07/28/2023] FLUoxetine (PROZAC) capsule 40 mg  40 mg Oral Daily Nkwenti, Tyler Aas, NP       hydrOXYzine (ATARAX) tablet 25 mg  25 mg Oral TID PRN Lenard Lance, FNP   25 mg at 07/26/23 0806   magnesium hydroxide (MILK OF MAGNESIA) suspension 30 mL  30 mL Oral Daily PRN Lenard Lance, FNP       nicotine polacrilex (NICORETTE) gum 2 mg  2 mg Oral PRN Starleen Blue, NP       traZODone (DESYREL) tablet 50 mg  50 mg Oral QHS PRN Lenard Lance, FNP   50 mg at 07/26/23 2135   Vitamin D (Ergocalciferol) (DRISDOL) 1.25 MG (50000 UNIT) capsule 50,000 Units  50,000 Units Oral Q7 days Golda Acre, MD   50,000 Units at 07/26/23 9604   vitamin D3 (CHOLECALCIFEROL) tablet 2,000 Units  2,000 Units Oral BID Golda Acre, MD   2,000 Units at 07/27/23 5409    Lab Results:  Results for orders placed or performed during the hospital encounter of 07/24/23 (from the past 48 hours)  Folate     Status: None   Collection Time: 07/25/23  6:25 PM  Result Value Ref Range   Folate 8.9 >5.9 ng/mL    Comment: Performed at [facility], [address], [place], [place] [address]  Vitamin B12     Status: Abnormal   Collection Time: 07/25/23  6:25 PM  Result Value Ref Range   Vitamin B-12 178 (L) 180 - 914 pg/mL    Comment: (NOTE) This assay is not validated for testing neonatal or myeloproliferative syndrome specimens for Vitamin B12 levels. Performed at [facility], [address], [place], [place] [address]   VITAMIN D 25 Hydroxy (Vit-D Deficiency, Fractures)     Status: Abnormal   Collection  Time: 07/25/23  6:25 PM  Result Value Ref Range   Vit D, 25-Hydroxy 17.55 (L) 30 - 100 ng/mL    Comment: (NOTE) Vitamin D deficiency has been defined by the Institute of Medicine  and an Endocrine Society practice guideline as a level of serum 25-OH  vitamin D less than 20 ng/mL (1,2). The Endocrine Society went on to  further define vitamin D insufficiency as a level between 21 and 29  ng/mL (2).  1. IOM (Institute of Medicine). 2010. Dietary reference intakes for  calcium and D. [place]: [employer]. 2. Holick MF, Binkley Dewy Rose, Bischoff-Ferrari HA, et al. Evaluation,  treatment, and prevention of vitamin D deficiency: an Endocrine  Society clinical practice guideline, JCEM. 2011 Jul; 96(7): 1911-30.  Performed at [facility], [address], [place], [place] [address]     Blood Alcohol level:  Lab Results  Component Value Date   [facility] <10 07/24/2023   ETH <10 12/23/2022    Metabolic Disorder Labs: Lab Results  Component Value Date   HGBA1C 4.7 (L) 07/24/2023   MPG 88.19 07/24/2023   MPG 99.67 04/17/2021   Lab Results  Component Value Date   PROLACTIN 10.1 07/24/2023   Lab Results  Component Value Date   CHOL 142 07/24/2023   TRIG 56 07/24/2023   HDL 39 (L) 07/24/2023   CHOLHDL 3.6 07/24/2023   VLDL 11 07/24/2023   LDLCALC 92 07/24/2023   LDLCALC 98 04/17/2021    Physical Findings: AIMS:  , ,  ,  ,    CIWA:    COWS:     Psychiatric Specialty Exam:  Presentation  General Appearance: Appropriate for Environment  Eye Contact: Fair  Speech: Clear and Coherent  Speech Volume: Normal  Handedness: Right   Mood and Affect  Mood: Euthymic  Affect: Congruent   Thought Process  Thought Processes: Coherent  Descriptions of Associations: Intact  Orientation: Full (Time, Place and Person)  Thought Content: Logical  History of Schizophrenia/Schizoaffective disorder: No  Duration of Psychotic Symptoms:  NA Hallucinations: Hallucinations: None  Ideas of Reference: None  Suicidal Thoughts: Suicidal Thoughts: No  Homicidal Thoughts: Homicidal Thoughts: No   Sensorium  Memory: Immediate Good  Judgment: Good  Insight: Good   Executive Functions  Concentration: Good  Attention Span: Good  Recall: Good  Fund of Knowledge: Good  Language: Good   Psychomotor Activity  Psychomotor Activity: Psychomotor Activity: Normal   Assets  Assets: Communication Skills; Resilience   Sleep  Sleep: Sleep: Good   Musculoskeletal: Strength & Muscle Tone: within normal limits Gait & Station: normal Patient leans: N/A   Physical Exam: General: Sitting comfortably. NAD. HEENT: Normocephalic, atraumatic, MMM, EMOI Lungs: no increased work of breathing noted Heart: no cyanosis Abdomen: Non distended Musculoskeletal: FROM. No obvious deformities Skin: Warm, dry, intact. No rashes noted Neuro: No obvious focal deficits.  Gait and station are normal  Review of Systems  Constitutional: Negative.   HENT: Negative.    Eyes: Negative.   Respiratory: Negative.    Cardiovascular: Negative.   Gastrointestinal: Negative.   Genitourinary: Negative.   Skin: Negative.   Neurological: Negative.   Psychiatric/Behavioral: Negative   Blood pressure 117/85, pulse 91, temperature 98.6 F (37 C), temperature source Oral, resp. rate 18, height 5\' 4"  (1.626 m), weight 62 kg, SpO2 100%, not currently breastfeeding. Body mass index is 23.45 kg/m.  ASSESSMENT: Julie Rios is an 23 y.o. female who  has a past medical history of Anxiety, B12 deficiency (07/26/2023), Depression, MDD (major depressive disorder), single episode, moderate (HCC), and Vitamin D deficiency (07/26/2023).  She presented on 07/24/2023  4:53 PM for MDD (major depressive disorder), recurrent severe, without psychosis (HCC).    Diagnoses / Active Problems: Patient Active Problem List   Diagnosis Date Noted   Nicotine use  disorder 07/25/2023   MDD (major depressive disorder), recurrent severe, without psychosis (HCC) 07/24/2023   Acute cholecystitis 04/30/2023   SVD (spontaneous vaginal delivery)  04/18/2023   GDM, class A1 04/17/2023   Pruritus 04/13/2023   Transaminitis 04/08/2023   GDM (gestational diabetes mellitus) 02/02/2023   Seizure-like activity (HCC) 02/01/2023   Pyelonephritis 11/26/2022   Pyelonephritis affecting pregnancy in second trimester 11/26/2022   Asymptomatic bacteriuria during pregnancy in first trimester 10/12/2022   Rh negative state in antepartum period 10/07/2022   Encounter for supervision of normal first pregnancy 10/06/2022   Nausea and vomiting in pregnancy 10/06/2022   GAD (generalized anxiety disorder) 04/22/2021   MDD (major depressive disorder), recurrent episode (HCC) 04/17/2021   Suicidal ideation       PLAN: Safety and Monitoring:  -- Inoluntary admission to inpatient psychiatric unit for safety, stabilization and treatment  -- Daily contact with patient to assess and evaluate symptoms and progress in treatment  -- Patient's case to be discussed in multi-disciplinary team meeting  -- Observation Level : q15 minute checks  -- Vital signs:  q12 hours  -- Precautions: suicide, elopement, and assault  2. Psychiatric Diagnoses and Treatment:  Patient Active Problem List   Diagnosis Date Noted   Nicotine use disorder 07/25/2023   MDD (major depressive disorder), recurrent severe, without psychosis (HCC) 07/24/2023   Acute cholecystitis 04/30/2023   SVD (spontaneous vaginal delivery) 04/18/2023   GDM, class A1 04/17/2023   Pruritus 04/13/2023   Transaminitis 04/08/2023   GDM (gestational diabetes mellitus) 02/02/2023   Seizure-like activity (HCC) 02/01/2023   Pyelonephritis 11/26/2022   Pyelonephritis affecting pregnancy in second trimester 11/26/2022   Asymptomatic bacteriuria during pregnancy in first trimester 10/12/2022   Rh negative state in antepartum  period 10/07/2022   Encounter for supervision of normal first pregnancy 10/06/2022   Nausea and vomiting in pregnancy 10/06/2022   GAD (generalized anxiety disorder) 04/22/2021   MDD (major depressive disorder), recurrent episode (HCC) 04/17/2021   Suicidal ideation      Scheduled Medications:  vitamin B-12  1,000 mcg Oral Daily   [START ON 07/28/2023] FLUoxetine  40 mg Oral Daily   Vitamin D (Ergocalciferol)  50,000 Units Oral Q7 days   cholecalciferol  2,000 Units Oral BID    As Needed Medications: acetaminophen, alum & mag hydroxide-simeth, haloperidol lactate **AND** diphenhydrAMINE **AND** LORazepam, hydrOXYzine, magnesium hydroxide, nicotine polacrilex, traZODone    3. Medical Issues Being Addressed:   -- As above  Labs reviewed, unremarkable with the exception of: As above    4. Discharge Planning:   -- Social work and case management to assist with discharge planning and identification of hospital follow-up needs prior to discharge  -- Estimated LOS: Likely discharge tomorrow  -- Discharge Concerns: Need to establish a safety plan; Medication compliance and effectiveness  -- Discharge Goals: Return home with outpatient referrals for mental health follow-up including medication management/psychotherapy  5. Short Term Goals:  Improve ability to identify changes in lifestyle to reduce recurrence of condition, verbalize feelings, disclose and discuss suicidal ideas, demonstrate self-control, identify and develop effective coping behaviors, compliance with prescribed medications, identify triggers associated with substance abuse/mental health issues, participate in unit milieu and in scheduled group therapies   6. Long Term Goals: Improvement in symptoms so the patient is ready for discharge   --The risks/benefits/side-effects/alternatives to the medications above were discussed in detail with the patient and time was given for questions. The patient provided informed consent.    -- Metabolic profile and EKG monitoring obtained while on an atypical antipsychotic and listed in the EHR    Total Time Spent in Direct Patient Care:  I personally  spent 35 minutes on the unit in direct patient care. The direct patient care time included face-to-face time with the patient, reviewing the patient's chart, communicating with other professionals, and coordinating care. Greater than 50% of this time was spent in counseling or coordinating care with the patient regarding goals of hospitalization, psycho-education, and discharge planning needs.      Criss Alvine, MD Psychiatrist  07/27/2023, 2:08 PM   I certify that inpatient services furnished can reasonably be expected to improve the patient's condition.    Portions of this note were created using voice recognition software. Minor syntax errors, grammatical content, spelling, or punctuation errors may have occurred unintentionally. Please notify the Thereasa Parkin if the meaning of any statement is unclear.

## 2023-07-27 NOTE — Progress Notes (Signed)
   07/27/23 0836  Psych Admission Type (Psych Patients Only)  Admission Status Involuntary  Psychosocial Assessment  Patient Complaints None  Eye Contact Fair  Facial Expression Anxious  Affect Appropriate to circumstance  Speech Logical/coherent  Interaction Assertive  Motor Activity Slow  Appearance/Hygiene Unremarkable  Behavior Characteristics Cooperative;Appropriate to situation  Mood Pleasant  Aggressive Behavior  Effect No apparent injury  Thought Process  Coherency WDL  Content WDL  Delusions WDL  Perception WDL  Hallucination None reported or observed  Judgment Impaired  Confusion WDL  Danger to Self  Current suicidal ideation? Denies  Agreement Not to Harm Self Yes  Description of Agreement verbally

## 2023-07-27 NOTE — Group Note (Signed)
 Date:  07/27/2023 Time:  12:01 PM  Group Topic/Focus:  Dimensions of Wellness:   The focus of this group is to introduce the topic of physical wellness and discuss its impact on mental health.     Participation Level:  Active  Participation Quality:  Appropriate and Sharing  Affect:  Appropriate  Cognitive:  Appropriate  Insight: Appropriate  Engagement in Group:  Engaged  Modes of Intervention:  Discussion and Education  Additional Comments:    Julie Rios 07/27/2023, 12:01 PM

## 2023-07-27 NOTE — Group Note (Signed)
 Recreation Therapy Group Note   Group Topic:Other  Group Date: 07/27/2023 Start Time: 1405 End Time: 1450 Facilitators: Arshan Jabs-McCall, LRT,CTRS Location: [address] Dayroom   Activity Description/Intervention: Therapeutic Drumming. Patients with peers and staff were given the opportunity to engage in a leader facilitated [facility] Group Empowerment Drumming Circle with staff from the [employer], in partnership with [employer]. Teaching laboratory technician and trained [employer], Theodoro Doing leading with LRT observing and documenting intervention and pt response. This evidenced-based practice targets 7 areas of health and wellbeing in the human experience including: stress-reduction, exercise, self-expression, camaraderie/support, nurturing, spirituality, and music-making (leisure).   Goal Area(s) Addresses:  Patient will engage in pro-social way in music group.  Patient will follow directions of drum leader on the first prompt. Patient will demonstrate no behavioral issues during group.  Patient will identify if a reduction in stress level occurs as a result of participation in therapeutic drum circle.    Education: Leisure exposure, Pharmacologist, Musical expression, Discharge Planning   Affect/Mood: Appropriate   Participation Level: Engaged   Participation Quality: Independent   Behavior: Appropriate   Speech/Thought Process: Focused   Insight: Good   Judgement: Good   Modes of Intervention: Teaching laboratory technician   Patient Response to Interventions:  Engaged   Education Outcome:  In group clarification offered    Clinical Observations/Individualized Feedback:  Romayne actively engaged in therapeutic drumming exercise and discussions. Pt was appropriate with peers, staff, and musical equipment for duration of programming.  Pt identified "fantastic" as their feeling after participation in music-based programming. Pt affect congruent/incongruent  with verbalized emotion.    Plan: Continue to engage patient in RT group sessions 2-3x/week.   Jacey Eckerson-McCall, LRT,CTRS 07/27/2023 4:15 PM

## 2023-07-27 NOTE — [facility] Suicide Risk Assessment (Signed)
 [facility] INPATIENT:  Family/Significant Other Suicide Prevention Education  Suicide Prevention Education:  Contact Attempts: May,Michael (Significant other) [phone] , (name of family member/significant other) has been identified by the patient as the family member/significant other with whom the patient will be residing, and identified as the person(s) who will aid the patient in the event of a mental health crisis.  With written consent from the patient, two attempts were made to provide suicide prevention education, prior to and/or following the patient's discharge.  We were unsuccessful in providing suicide prevention education.  A suicide education pamphlet was given to the patient to share with family/significant other.  Date and time of first attempt: 07/27/23 @ 1613  Second attempt: 07/28/23 @ 0802 lvm   Kathi Der 07/27/2023, 4:15 PM

## 2023-07-27 NOTE — Plan of Care (Signed)
   Problem: Coping: Goal: Ability to verbalize frustrations and anger appropriately will improve Outcome: Progressing   Problem: Safety: Goal: Periods of time without injury will increase Outcome: Progressing

## 2023-07-27 NOTE — [facility] Group Notes (Signed)
 Group Topic: Activities that bring joy/memories  Goals: Reignite passions that may be significant sources of hope/possibility, reawaken memories that we hold dear that may also be part of healing grief, consider new traditions/values and reflect on what is "spiritual" about doing things that bring [place] joy, fulfillment, and peace.  Theoretical basis: All group dynamics rooted in group psychotherapeutic approaches of Chyrl Civatte, also Rogerian and Relational Cultural Therapeutic concepts that aim to foster relational support, mutual empathy, and unconditional positive regard/acceptance.  Observations: Julie Rios was an active participant in the group discussion.  [name], M.Div [phone]

## 2023-07-27 NOTE — Progress Notes (Signed)
   07/27/23 2015  Psych Admission Type (Psych Patients Only)  Admission Status Involuntary  Psychosocial Assessment  Patient Complaints None  Eye Contact Fair  Facial Expression Anxious  Affect Appropriate to circumstance  Speech Logical/coherent  Interaction Assertive  Motor Activity Slow  Appearance/Hygiene Unremarkable  Behavior Characteristics Cooperative  Mood Pleasant  Aggressive Behavior  Effect No apparent injury  Thought Process  Coherency WDL  Content WDL  Delusions WDL  Perception WDL  Hallucination None reported or observed  Judgment Impaired  Confusion WDL  Danger to Self  Current suicidal ideation? Denies  Danger to Others  Danger to Others None reported or observed

## 2023-07-28 DIAGNOSIS — F332 Major depressive disorder, recurrent severe without psychotic features: Secondary | ICD-10-CM | POA: Diagnosis not present

## 2023-07-28 MED ORDER — HYDROXYZINE HCL 25 MG PO TABS: 25.0000 mg | ORAL_TABLET | Freq: Three times a day (TID) | ORAL | 0 refills | Status: DC | PRN

## 2023-07-28 MED ORDER — CYANOCOBALAMIN 1000 MCG PO TABS: 1000.0000 ug | ORAL_TABLET | Freq: Every day | ORAL | 0 refills | Status: AC

## 2023-07-28 MED ORDER — CHOLECALCIFEROL 125 MCG (5000 UT) PO TABS
1.0000 | ORAL_TABLET | Freq: Every day | ORAL | 0 refills | Status: AC
Start: 2023-07-28 — End: ?

## 2023-07-28 MED ORDER — VITAMIN D (ERGOCALCIFEROL) 1.25 MG (50000 UNIT) PO CAPS: 50000.0000 [IU] | ORAL_CAPSULE | ORAL | 0 refills | Status: AC

## 2023-07-28 MED ORDER — FLUOXETINE HCL 40 MG PO CAPS: 40.0000 mg | ORAL_CAPSULE | Freq: Every day | ORAL | 0 refills | Status: DC

## 2023-07-28 MED ORDER — VITAMIN D3 25 MCG PO TABS: 2000.0000 [IU] | ORAL_TABLET | Freq: Two times a day (BID) | ORAL | 0 refills | Status: DC

## 2023-07-28 MED ORDER — NICOTINE POLACRILEX 2 MG MT GUM: 2.0000 mg | CHEWING_GUM | OROMUCOSAL | Status: DC | PRN

## 2023-07-28 MED ORDER — TRAZODONE HCL 50 MG PO TABS: 50.0000 mg | ORAL_TABLET | Freq: Every evening | ORAL | 0 refills | Status: DC | PRN

## 2023-07-28 NOTE — [facility] Suicide Risk Assessment (Signed)
 [facility] INPATIENT:  Family/Significant Other Suicide Prevention Education  Suicide Prevention Education:  Suicide Prevention Education was reviewed thoroughly with patient, including risk factors, warning signs, and what to do.  Mobile Crisis services were described and that telephone number pointed out, with encouragement to patient to put this number in personal cell phone.  Brochure was provided to patient to share with natural supports.  Patient acknowledged the ways in which they are at risk, and how working through each of their issues can gradually start to reduce their risk factors.  Patient was encouraged to think of the information in the context of people in their own lives.  Patient denied having access to firearms  Patient verbalized understanding of information provided.  Patient endorsed a desire to live.

## 2023-07-28 NOTE — [facility] Group Notes (Signed)
 [facility] Group Notes:  (Nursing/MHT/Case Management/Adjunct)  Date:  07/28/2023  Time:  2:40 AM  Type of Therapy:   NA Group  Participation Level:  Active  Participation Quality:  Appropriate  Affect:  Appropriate  Cognitive:  Appropriate  Insight:  Appropriate  Engagement in Group:  Engaged  Modes of Intervention:  Education  Summary of Progress/Problems: Attended NA meeting.  Julie Rios 07/28/2023, 2:40 AM

## 2023-07-28 NOTE — [facility] Suicide Risk Assessment (Signed)
 Suicide Risk Assessment  Discharge Assessment    [address] [facility] Discharge Suicide Risk Assessment   Principal Problem: MDD (major depressive disorder), recurrent severe, without psychosis (HCC) Discharge Diagnoses: Principal Problem:   MDD (major depressive disorder), recurrent severe, without psychosis (HCC) Active Problems:   GAD (generalized anxiety disorder)   Nicotine use disorder  Total Time spent with patient:  Greater than 30 minutes  Musculoskeletal: Strength & Muscle Tone: within normal limits Gait & Station: normal Patient leans: N/A  Psychiatric Specialty Exam  Presentation  General Appearance:  Appropriate for Environment; Casual; Fairly Groomed  Eye Contact: Good  Speech: Clear and Coherent; Normal Rate  Speech Volume: Normal  Handedness: Right   Mood and Affect  Mood: Euthymic  Duration of Depression Symptoms: Greater than two weeks  Affect: Appropriate; Congruent  Thought Process  Thought Processes: Coherent; Goal Directed; Linear  Descriptions of Associations:Intact  Orientation:Full (Time, Place and Person)  Thought Content:Logical  History of Schizophrenia/Schizoaffective disorder:No  Duration of Psychotic Symptoms:No data recorded Hallucinations:Hallucinations: None  Ideas of Reference:None  Suicidal Thoughts:Suicidal Thoughts: No  Homicidal Thoughts:Homicidal Thoughts: No  Sensorium  Memory: Immediate Good; Recent Good; Remote Good  Judgment: Good  Insight: Good  Executive Functions  Concentration: Good  Attention Span: Good  Recall: Good  Fund of Knowledge: Fair  Language: Good   Psychomotor Activity  Psychomotor Activity:Psychomotor Activity: Normal   Assets  Assets: Communication Skills; Desire for Improvement; Housing; Health and safety inspector; Physical Health; Resilience; Social Support   Sleep  Sleep:Sleep: Good Number of Hours of Sleep: 8.5   Physical Exam: See discharge  summary.  Blood pressure 124/80, pulse 81, temperature 98.2 F (36.8 C), temperature source Oral, resp. rate 18, height 5\' 4"  (1.626 m), weight 62 kg, SpO2 100%, not currently breastfeeding. Body mass index is 23.45 kg/m.  Mental Status Per Nursing Assessment::   On Admission:  Suicidal ideation indicated by patient  Demographic Factors:  Adolescent or young adult and Caucasian  Loss Factors: NA  Historical Factors: Impulsivity  Risk Reduction Factors:   Responsible for children under 21 years of age, Sense of responsibility to family, Living with another person, especially a relative, Positive social support, Positive therapeutic relationship, and Positive coping skills or problem solving skills  Continued Clinical Symptoms:  Alcohol/Substance Abuse/Dependencies More than one psychiatric diagnosis  Cognitive Features That Contribute To Risk:  Polarized thinking    Suicide Risk:  Minimal: No identifiable suicidal ideation.  Patients presenting with no risk factors but with morbid ruminations; may be classified as minimal risk based on the severity of the depressive symptoms   Follow-up Information     [facility] Follow up on 08/08/2023.   Specialty: Behavioral Health Why: You have an appointment for medication management services on  08/08/23 at 11:00 am.  You also have an appointment for therapy services on 09/13/23 at 2pm with Paige  .  For faster service, please go Monday through Friday, arrive by 7:00 am for an assessment. Contact information: [address] [place] [address] [phone]               Plan Of Care/Follow-up recommendations:  See the discharge recommendations above.  Armandina Stammer, NP, pmhnp, fnp-bc. 07/28/2023, 10:40 AM

## 2023-07-28 NOTE — Discharge Summary (Signed)
 Physician Discharge Summary Note  Patient:  Julie Rios is an 23 y.o., Rios MRN:  [id number] DOB:  12-Apr-2001 Patient phone:  [phone] (home)  Patient address:   [address] [name] [address],  Total Time spent with patient:  Greater than 30 minutes.  Date of Admission:  07/24/2023 Date of Discharge: 07-28-23  Reason for Admission: Worsening suicidal ideations.  Principal Problem: MDD (major depressive disorder), recurrent severe, without psychosis (HCC)  Discharge Diagnoses: Principal Problem:   MDD (major depressive disorder), recurrent severe, without psychosis (HCC) Active Problems:   GAD (generalized anxiety disorder)   Nicotine use disorder  Past Psychiatric History: Major depressive disorder, GAD.  Past Medical History:  Past Medical History:  Diagnosis Date   Anxiety    B12 deficiency 07/26/2023   Depression    MDD (major depressive disorder), single episode, moderate (HCC)    Vitamin D deficiency 07/26/2023    Past Surgical History:  Procedure Laterality Date   NO PAST SURGERIES     Family History:  Family History  Problem Relation Age of Onset   Diabetes Mother    Cirrhosis Mother    Diabetes Father    Colon cancer Neg Hx    Esophageal cancer Neg Hx    Family Psychiatric  History: See H&P.  Social History:  Social History   Substance and Sexual Activity  Alcohol Use No   Comment: occ     Social History   Substance and Sexual Activity  Drug Use Not Currently   Types: Other-see comments, Marijuana    Social History   Socioeconomic History   Marital status: Single    Spouse name: Casimiro Needle   Number of children: Not on file   Years of education: Not on file   Highest education level: Not on file  Occupational History   Not on file  Tobacco Use   Smoking status: Never    Passive exposure: Yes   Smokeless tobacco: Never  Vaping Use   Vaping status: Some Days   Substances: Nicotine  Substance and Sexual Activity    Alcohol use: No    Comment: occ   Drug use: Not Currently    Types: Other-see comments, Marijuana   Sexual activity: Yes    Birth control/protection: None  Other Topics Concern   Not on file  Social History Narrative   Not on file   Social Drivers of Health   Financial Resource Strain: Not on file  Food Insecurity: No Food Insecurity (07/24/2023)   Hunger Vital Sign    Worried About Running Out of Food in the Last Year: Never true    Ran Out of Food in the Last Year: Never true  Transportation Needs: No Transportation Needs (07/24/2023)   PRAPARE - Administrator, Civil Service (Medical): No    Lack of Transportation (Non-Medical): No  Physical Activity: Not on file  Stress: Not on file  Social Connections: Unknown (09/22/2021)   [facility] from [name]   Social Network    Social Network: Not on file   Hospital Course: (Per admission evaluation  notes): 24 y.o. Rios with prior mental health history of GAD, & postpartum depression who initially presented to the [name] on 03/15 with suicidal ideations.  Patient also made a superficial cut to her left wrist with a steak knife.  Patient was transferred and admitted to this [name] under IVC status on 07/24/23.   Upon the decision to discharge Julie Rios today, she  was seen & evaluated  by her treatment team for mood stability. The current laboratory findings were reviewed. The nurses notes & vital signs were reviewed as well. All are stable. At this present time, there are no current mental health or medical issues that should prevent this discharge at this time. Patient is being discharged to continue mental health care & medication management as noted below.  Per chart review, this is the [facility] psychiatric [facility] for [facility] Julie Rios. She was admitted to the [name] for evaluation & treatment due to worsening suicidal ideations with a  self-inflicted superficial cut to her left writ.  After evaluation of presenting symptoms as noted on her initial admission evaluation notes, Julie Rios was recommended for mood stabilization treatments. The medication regimen for her presenting symptoms were discussed & with her consent initiated. The uses of the medications & potential adverse effects/reactions were discussed with her. She was given an ample amount of time to ask any possible questions that she may have at the time. She received, stabilized & was discharged on the medications as listed below on her discharge medication lists. She was also enrolled & participated in the group counseling sessions being offered & held on this unit. She learned coping skills that should help her after discharge to cope better to maintain mood stability. She presented on this admission, no other pre-existing medical conditions that required treatment & monitoring, other than vitamin supplementation. She tolerated her treatment regimen without any adverse effects or reactions reported.   Julie Rios's symptoms responded well to her treatment regimen warranting this discharge. She is also mentally & medically stable & agreeable to this discharge. During the course of this hospitalization, the 15-minute checks were adequate to ensure her safety. Patient did not display any dangerous, violent or suicidal behavior on the unit.  She interacted with patients & staff appropriately. She participated appropriately in the group sessions/therapies. Her medications were addressed & adjusted to meet her needs. She was recommended for outpatient follow-up care & medication management upon discharge to assure her continuity of care.  At the time of discharge patient is not reporting any acute suicidal/homicidal ideations. She currently denies any new issues or concerns. Education and supportive counseling provided throughout her hospital stay & upon discharge.   Today upon her discharge  evaluation with her treatment team, Julie Rios shares she is feeling a lot better than when first admitted to the hospital. She denies any other specific concerns. She is sleeping well. Her appetite is good. She denies other physical complaints. She denies AH/VH, delusional thoughts or paranoia. She does not appear to be responding to any internal stimuli. She feels that her medications have been helpful & is in agreement to continue her current treatment regimen as recommended. She was able to engage in safety planning including plan to return to [facility] or contact emergency services if she feels unable to maintain her own safety or the safety of others. Pt had no further questions, comments, or concerns. She left [facility] with all personal belongings in no apparent distress. Transportation per her mother in-law.   Physical Findings: AIMS:  , ,  ,  ,    CIWA:    COWS:     Musculoskeletal: Strength & Muscle Tone: within normal limits Gait & Station: normal Patient leans: N/A   Psychiatric Specialty Exam:  Presentation  General Appearance:  Appropriate for Environment; Casual; Fairly Groomed  Eye Contact: Good  Speech: Clear and Coherent; Normal  Rate  Speech Volume: Normal  Handedness: Right  Mood and Affect  Mood: Euthymic  Affect: Appropriate; Congruent  Thought Process  Thought Processes: Coherent; Goal Directed; Linear  Descriptions of Associations:Intact  Orientation:Full (Time, Place and Person)  Thought Content:Logical  History of Schizophrenia/Schizoaffective disorder:No  Duration of Psychotic Symptoms:No data recorded Hallucinations:Hallucinations: None  Ideas of Reference:None  Suicidal Thoughts:Suicidal Thoughts: No  Homicidal Thoughts:Homicidal Thoughts: No   Sensorium  Memory: Immediate Good; Recent Good; Remote Good  Judgment: Good  Insight: Good  Executive Functions  Concentration: Good  Attention Span: Good  Recall: Good  Fund of  Knowledge: Fair  Language: Good  Psychomotor Activity  Psychomotor Activity: Psychomotor Activity: Normal  Assets  Assets: Communication Skills; Desire for Improvement; Housing; Health and safety inspector; Physical Health; Resilience; Social Support  Sleep  Sleep: Sleep: Good Number of Hours of Sleep: 8.5  Physical Exam: Physical Exam Vitals and nursing note reviewed.  HENT:     Nose: Nose normal.     Mouth/Throat:     Pharynx: Oropharynx is clear.  Cardiovascular:     Rate and Rhythm: Normal rate.     Pulses: Normal pulses.  Pulmonary:     Effort: Pulmonary effort is normal.  Genitourinary:    Comments: Deferred Musculoskeletal:        General: Normal range of motion.     Cervical back: Normal range of motion.  Skin:    General: Skin is warm and dry.  Neurological:     General: No focal deficit present.     Mental Status: She is oriented to person, place, and time. Mental status is at baseline.    Review of Systems  Constitutional:  Negative for chills, diaphoresis and fever.  HENT:  Negative for congestion and sore throat.   Respiratory:  Negative for cough, shortness of breath and wheezing.   Cardiovascular:  Negative for chest pain and palpitations.  Gastrointestinal:  Negative for abdominal pain, constipation, diarrhea, heartburn, nausea and vomiting.  Genitourinary:  Negative for dysuria.  Musculoskeletal:  Negative for joint pain and myalgias.  Neurological:  Negative for dizziness, tingling, tremors, sensory change, speech change, focal weakness, seizures, loss of consciousness, weakness and headaches.  Psychiatric/Behavioral:  Positive for depression (Hx of (stable on medication).) and substance abuse (Hx THC use disorder.). Negative for hallucinations, memory loss and suicidal ideas. The patient is not nervous/anxious (Stable upon discharge.) and does not have insomnia.    Blood pressure 124/80, pulse 81, temperature 98.2 F (36.8 C), temperature  source Oral, resp. rate 18, height 5\' 4"  (1.626 m), weight 62 kg, SpO2 100%, not currently breastfeeding. Body mass index is 23.45 kg/m.   Social History   Tobacco Use  Smoking Status Never   Passive exposure: Yes  Smokeless Tobacco Never   Tobacco Cessation:  A prescription for an FDA-approved tobacco cessation medication provided at discharge  Blood Alcohol level:  Lab Results  Component Value Date   [facility] <10 07/24/2023   ETH <10 12/23/2022    Metabolic Disorder Labs:  Lab Results  Component Value Date   HGBA1C 4.7 (L) 07/24/2023   MPG 88.19 07/24/2023   MPG 99.67 04/17/2021   Lab Results  Component Value Date   PROLACTIN 10.1 07/24/2023   Lab Results  Component Value Date   CHOL 142 07/24/2023   TRIG 56 07/24/2023   HDL 39 (L) 07/24/2023   CHOLHDL 3.6 07/24/2023   VLDL 11 07/24/2023   LDLCALC 92 07/24/2023   LDLCALC 98 04/17/2021    See  Psychiatric Specialty Exam and Suicide Risk Assessment completed by Attending Physician prior to discharge.  Discharge destination:  Home  Is patient on multiple antipsychotic therapies at discharge:  No   Has Patient had three or more failed trials of antipsychotic monotherapy by history:  No  Recommended Plan for Multiple Antipsychotic Therapies: NA   Allergies as of 07/28/2023   No Known Allergies      Medication List     TAKE these medications      Indication  cyanocobalamin 1000 MCG tablet Take 1 tablet (1,000 mcg total) by mouth daily. For vit B12 replacement. Start taking on: July 29, 2023  Indication: Inadequate Vitamin B12   FLUoxetine 40 MG capsule Commonly known as: PROZAC Take 1 capsule (40 mg total) by mouth daily. For depression. Start taking on: July 29, 2023 What changed:  medication strength how much to take when to take this additional instructions  Indication: Generalized Anxiety Disorder, Major Depressive Disorder   hydrOXYzine 25 MG tablet Commonly known as: ATARAX Take 1 tablet  (25 mg total) by mouth 3 (three) times daily as needed for anxiety.  Indication: Feeling Anxious   nicotine polacrilex 2 MG gum Commonly known as: NICORETTE Take 1 each (2 mg total) by mouth as needed. (May buy from over the counter): For smoking cessation.  Indication: Nicotine Addiction   traZODone 50 MG tablet Commonly known as: DESYREL Take 1 tablet (50 mg total) by mouth at bedtime as needed for sleep.  Indication: Trouble Sleeping   Vitamin D (Ergocalciferol) 1.25 MG (50000 UNIT) Caps capsule Commonly known as: DRISDOL Take 1 capsule (50,000 Units total) by mouth every 7 (seven) days. For bone health Start taking on: August 02, 2023  Indication: Vitamin D Deficiency   vitamin D3 25 MCG tablet Commonly known as: CHOLECALCIFEROL Take 2 tablets (2,000 Units total) by mouth 2 (two) times daily. For bone health.  Indication: Vitamin D Deficiency        Follow-up Information     [facility] Follow up on 08/08/2023.   Specialty: Behavioral Health Why: You have an appointment for medication management services on  08/08/23 at 11:00 am.  You also have an appointment for therapy services on 09/13/23 at 2pm with Paige  .  For faster service, please go Monday through Friday, arrive by 7:00 am for an assessment. Contact information: [address] [place] [address] [phone]               Follow-up recommendations: Activity:  As tolerated Diet: As recommended by your primary care doctor. Keep all scheduled follow-up appointments as recommended.   Comments: Comments: Patient is recommended to follow-up care on an outpatient basis as noted above. Prescriptions sent to pt's pharmacy of choice at discharge.   Patient agreeable to plan.   Given opportunity to ask questions.   Appears to feel comfortable with discharge denies any current suicidal or homicidal thought. Patient is also instructed prior to discharge to: Take all medications  as prescribed by his/her mental healthcare provider. Report any adverse effects and or reactions from the medicines to his/her outpatient provider promptly. Patient has been instructed & cautioned: To not engage in alcohol and or illegal drug use while on prescription medicines. In the event of worsening symptoms, patient is instructed to call the crisis hotline, 911 and or go to the nearest ED for appropriate evaluation and treatment of symptoms. To follow-up with his/her primary care provider for your other medical issues, concerns  and or health care needs.  Signed: Armandina Stammer, NP, pmhnp, fnp-bc. 07/28/2023, 10:41 AM

## 2023-07-28 NOTE — Progress Notes (Signed)
 D: Pt A & O X 3. Denies SI, HI, AVH and pain at this time. D/C home as ordered. Picked up in lobby by her mother in Social worker. A: D/C instructions reviewed with pt including prescriptions and follow up appointments; compliance encouraged. All belongings from locker 41 to pt at time of departure. Scheduled medications given with verbal education and effects monitored. Safety checks maintained without incident till time of d/c.  R: Pt receptive to care. Compliant with medications when offered. Denies adverse drug reactions when assessed. Verbalized understanding related to d/c instructions. Signed belonging sheet in agreement with items received from locker. Ambulatory with a steady gait. Appears to be in no physical distress at time of departure.

## 2023-07-28 NOTE — Progress Notes (Signed)
  [facility] Adult Case Management Discharge Plan :  Will you be returning to the same living situation after discharge:  Yes,  pt will be returning home at discharge At discharge, do you have transportation home?: Yes,  pt will be picked up by Mother in Law Do you have the ability to pay for your medications: Yes,  pt has active [facility]  Release of information consent forms completed and in the chart;  Patient's signature needed at discharge.  Patient to Follow up at:  Follow-up Information     [facility] Follow up on 08/08/2023.   Specialty: Behavioral Health Why: You have an appointment for medication management services on  08/08/23 at 11:00 am.  You also have an appointment for therapy services on 09/13/23 at 2pm with Paige  .  For faster service, please go Monday through Friday, arrive by 7:00 am for an assessment. Contact information: [address] [place] [address] [phone]                Next level of care provider has access to [facility] Link:no  Safety Planning and Suicide Prevention discussed: Yes,  completed with pt - unable to connect with boyfriend, Sherril Cong     Has patient been referred to the Quitline?: Patient refused referral for treatment  Patient has been referred for addiction treatment: No known substance use disorder.  Kathi Der, LCSWA 07/28/2023, 9:24 AM

## 2023-08-08 ENCOUNTER — Ambulatory Visit (INDEPENDENT_AMBULATORY_CARE_PROVIDER_SITE_OTHER): Payer: MEDICAID | Admitting: Psychiatry

## 2023-08-08 ENCOUNTER — Encounter (HOSPITAL_COMMUNITY): Payer: Self-pay | Admitting: Psychiatry

## 2023-08-08 VITALS — BP 121/72 | HR 83 | Temp 98.2°F | Ht 64.0 in | Wt 141.6 lb

## 2023-08-08 DIAGNOSIS — F411 Generalized anxiety disorder: Secondary | ICD-10-CM | POA: Diagnosis not present

## 2023-08-08 DIAGNOSIS — F331 Major depressive disorder, recurrent, moderate: Secondary | ICD-10-CM

## 2023-08-08 MED ORDER — HYDROXYZINE HCL 25 MG PO TABS: 25.0000 mg | ORAL_TABLET | Freq: Three times a day (TID) | ORAL | 3 refills | Status: DC | PRN

## 2023-08-08 MED ORDER — TRAZODONE HCL 50 MG PO TABS: 50.0000 mg | ORAL_TABLET | Freq: Every evening | ORAL | 3 refills | Status: AC | PRN

## 2023-08-08 MED ORDER — FLUOXETINE HCL 20 MG PO CAPS: 60.0000 mg | ORAL_CAPSULE | Freq: Every day | ORAL | 3 refills | Status: DC
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
## 2023-09-13 ENCOUNTER — Ambulatory Visit (HOSPITAL_COMMUNITY): Payer: MEDICAID | Admitting: Clinical

## 2023-09-13 ENCOUNTER — Telehealth (HOSPITAL_COMMUNITY): Payer: Self-pay
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
## 2023-09-13 NOTE — Telephone Encounter (Signed)
 PT called this afternoon and left voicemail requesting a different therapist - I went to call the PT back and ask how I could help them and the PT hung up on me

## 2023-09-14 ENCOUNTER — Encounter (HOSPITAL_COMMUNITY): Payer: Self-pay | Admitting: Psychiatry

## 2023-09-14 ENCOUNTER — Telehealth (HOSPITAL_COMMUNITY): Payer: Self-pay | Admitting: Professional

## 2023-09-14 ENCOUNTER — Telehealth (HOSPITAL_COMMUNITY): Payer: MEDICAID | Admitting: Psychiatry

## 2023-09-14 DIAGNOSIS — F411 Generalized anxiety disorder: Secondary | ICD-10-CM

## 2023-09-14 DIAGNOSIS — F331 Major depressive disorder, recurrent, moderate: Secondary | ICD-10-CM

## 2023-09-14 DIAGNOSIS — F339 Major depressive disorder, recurrent, unspecified: Secondary | ICD-10-CM

## 2023-09-14 DIAGNOSIS — F332 Major depressive disorder, recurrent severe without psychotic features: Secondary | ICD-10-CM

## 2023-09-14 DIAGNOSIS — E559 Vitamin D deficiency, unspecified: Secondary | ICD-10-CM | POA: Diagnosis not present

## 2023-09-14 MED ORDER — FLUOXETINE HCL 20 MG PO CAPS: 60.0000 mg | ORAL_CAPSULE | Freq: Every day | ORAL | 2 refills | Status: AC

## 2023-09-14 MED ORDER — HYDROXYZINE HCL 25 MG PO TABS: 12.5000 mg | ORAL_TABLET | Freq: Three times a day (TID) | ORAL | 2 refills | Status: AC | PRN

## 2023-09-14 NOTE — Patient Instructions (Signed)
 Thank you for attending your appointment today.  -- We did not make any medication changes today. Please continue medications as prescribed. -- As discussed, referral to our partial hospitalization program was placed today. If you do not hear back, please call our clinic and we can get you connected.   Please do not make any changes to medications without first discussing with your provider. If you are experiencing a psychiatric emergency, please call 911 or present to your nearest emergency department. Additional crisis, medication management, and therapy resources are included below.  [facility]  [address], [place], [place] [address] [phone] WALK-IN URGENT CARE 24/7 FOR ANYONE [address], [place], [place]  [phone] Fax: [phone] [email or web address] *Interpreters available *Accepts all insurance and uninsured for Urgent Care needs *Accepts Medicaid and uninsured for outpatient treatment (below)      ONLY FOR [facility]  Below:    Outpatient New Patient Assessment/Therapy Walk-ins:        Monday, Wednesday, and Thursday 8am until slots are full (first come, first served)                   New Patient Psychiatry/Medication Management        Monday-Friday 8am-11am (first come, first served)               For all walk-ins we ask that you arrive by 7:15am, because patients will be seen in the order of arrival.

## 2023-09-22 ENCOUNTER — Telehealth (HOSPITAL_COMMUNITY): Payer: Self-pay | Admitting: Professional

## 2023-09-27 ENCOUNTER — Telehealth (HOSPITAL_COMMUNITY): Payer: Self-pay | Admitting: Professional

## 2023-10-26 ENCOUNTER — Ambulatory Visit (HOSPITAL_COMMUNITY): Payer: MEDICAID | Admitting: Licensed Clinical Social Worker

## 2023-11-02 ENCOUNTER — Ambulatory Visit: Payer: MEDICAID

## 2023-11-03 ENCOUNTER — Ambulatory Visit: Payer: MEDICAID | Admitting: Family Medicine

## 2023-11-04 NOTE — Progress Notes (Unsigned)
 Patient did not connect for virtual psychiatric medication management appointment on 11/09/23 at 11:30AM. Sent secure video link with no response. Called phone with no answer; left VM with callback number to reschedule.  LAURAINE DELENA PUMMEL, MD 11/09/23

## 2023-11-09 ENCOUNTER — Encounter (HOSPITAL_COMMUNITY): Payer: Self-pay

## 2023-11-09 ENCOUNTER — Encounter (HOSPITAL_COMMUNITY): Payer: MEDICAID | Admitting: Psychiatry

## 2023-11-10 ENCOUNTER — Telehealth (HOSPITAL_COMMUNITY): Payer: MEDICAID | Admitting: Psychiatry

## 2023-12-27 ENCOUNTER — Ambulatory Visit: Payer: MEDICAID
